# Patient Record
Sex: Male | Born: 1935
Health system: Southern US, Community
[De-identification: ages and names within clinical notes are randomized; demographics above are authoritative.]

## PROBLEM LIST (undated history)

## (undated) DIAGNOSIS — L409 Psoriasis, unspecified: Secondary | ICD-10-CM

## (undated) DIAGNOSIS — I10 Essential (primary) hypertension: Secondary | ICD-10-CM

## (undated) DIAGNOSIS — H409 Unspecified glaucoma: Secondary | ICD-10-CM

## (undated) HISTORY — PX: HYDROCELE EXCISION / REPAIR: SUR1145

## (undated) HISTORY — PX: GLAUCOMA SURGERY: SHX656

## (undated) HISTORY — PX: HERNIA REPAIR: SHX51

---

## 1997-09-24 ENCOUNTER — Other Ambulatory Visit: Admission: RE | Admit: 1997-09-24 | Discharge: 1997-09-24 | Payer: Self-pay | Admitting: Gastroenterology

## 1997-09-30 ENCOUNTER — Ambulatory Visit (HOSPITAL_COMMUNITY): Admission: RE | Admit: 1997-09-30 | Discharge: 1997-09-30 | Payer: Self-pay | Admitting: Gastroenterology

## 2000-01-04 ENCOUNTER — Encounter: Payer: Self-pay | Admitting: Neurological Surgery

## 2000-01-05 ENCOUNTER — Encounter: Payer: Self-pay | Admitting: Neurological Surgery

## 2000-01-05 ENCOUNTER — Inpatient Hospital Stay (HOSPITAL_COMMUNITY): Admission: RE | Admit: 2000-01-05 | Discharge: 2000-01-06 | Payer: Self-pay | Admitting: Neurological Surgery

## 2000-07-18 ENCOUNTER — Ambulatory Visit (HOSPITAL_COMMUNITY): Admission: RE | Admit: 2000-07-18 | Discharge: 2000-07-18 | Payer: Self-pay | Admitting: Dermatology

## 2000-07-18 ENCOUNTER — Encounter: Payer: Self-pay | Admitting: Dermatology

## 2002-05-19 ENCOUNTER — Ambulatory Visit (HOSPITAL_COMMUNITY): Admission: RE | Admit: 2002-05-19 | Discharge: 2002-05-19 | Payer: Self-pay | Admitting: Gastroenterology

## 2011-06-01 DIAGNOSIS — K648 Other hemorrhoids: Secondary | ICD-10-CM | POA: Diagnosis not present

## 2011-06-01 DIAGNOSIS — Z86010 Personal history of colonic polyps: Secondary | ICD-10-CM | POA: Diagnosis not present

## 2011-06-01 DIAGNOSIS — Z09 Encounter for follow-up examination after completed treatment for conditions other than malignant neoplasm: Secondary | ICD-10-CM | POA: Diagnosis not present

## 2011-06-01 DIAGNOSIS — K5730 Diverticulosis of large intestine without perforation or abscess without bleeding: Secondary | ICD-10-CM | POA: Diagnosis not present

## 2011-06-01 DIAGNOSIS — D126 Benign neoplasm of colon, unspecified: Secondary | ICD-10-CM | POA: Diagnosis not present

## 2011-06-20 DIAGNOSIS — L408 Other psoriasis: Secondary | ICD-10-CM | POA: Diagnosis not present

## 2011-06-20 DIAGNOSIS — Z79899 Other long term (current) drug therapy: Secondary | ICD-10-CM | POA: Diagnosis not present

## 2011-08-01 DIAGNOSIS — K219 Gastro-esophageal reflux disease without esophagitis: Secondary | ICD-10-CM | POA: Diagnosis not present

## 2011-08-01 DIAGNOSIS — Z961 Presence of intraocular lens: Secondary | ICD-10-CM | POA: Diagnosis not present

## 2011-08-01 DIAGNOSIS — I10 Essential (primary) hypertension: Secondary | ICD-10-CM | POA: Diagnosis not present

## 2011-08-01 DIAGNOSIS — H4011X0 Primary open-angle glaucoma, stage unspecified: Secondary | ICD-10-CM | POA: Diagnosis not present

## 2011-08-01 DIAGNOSIS — N400 Enlarged prostate without lower urinary tract symptoms: Secondary | ICD-10-CM | POA: Diagnosis not present

## 2011-08-01 DIAGNOSIS — H01009 Unspecified blepharitis unspecified eye, unspecified eyelid: Secondary | ICD-10-CM | POA: Diagnosis not present

## 2011-08-01 DIAGNOSIS — E782 Mixed hyperlipidemia: Secondary | ICD-10-CM | POA: Diagnosis not present

## 2011-08-01 DIAGNOSIS — H40029 Open angle with borderline findings, high risk, unspecified eye: Secondary | ICD-10-CM | POA: Diagnosis not present

## 2011-08-01 DIAGNOSIS — L408 Other psoriasis: Secondary | ICD-10-CM | POA: Diagnosis not present

## 2011-08-01 DIAGNOSIS — R7309 Other abnormal glucose: Secondary | ICD-10-CM | POA: Diagnosis not present

## 2011-11-16 DIAGNOSIS — Z23 Encounter for immunization: Secondary | ICD-10-CM | POA: Diagnosis not present

## 2011-11-16 DIAGNOSIS — S8010XA Contusion of unspecified lower leg, initial encounter: Secondary | ICD-10-CM | POA: Diagnosis not present

## 2011-12-01 DIAGNOSIS — L408 Other psoriasis: Secondary | ICD-10-CM | POA: Diagnosis not present

## 2011-12-01 DIAGNOSIS — S8010XA Contusion of unspecified lower leg, initial encounter: Secondary | ICD-10-CM | POA: Diagnosis not present

## 2011-12-19 DIAGNOSIS — L408 Other psoriasis: Secondary | ICD-10-CM | POA: Diagnosis not present

## 2012-01-15 DIAGNOSIS — Z23 Encounter for immunization: Secondary | ICD-10-CM | POA: Diagnosis not present

## 2012-02-01 DIAGNOSIS — H40029 Open angle with borderline findings, high risk, unspecified eye: Secondary | ICD-10-CM | POA: Diagnosis not present

## 2012-02-01 DIAGNOSIS — H409 Unspecified glaucoma: Secondary | ICD-10-CM | POA: Diagnosis not present

## 2012-02-01 DIAGNOSIS — Z961 Presence of intraocular lens: Secondary | ICD-10-CM | POA: Diagnosis not present

## 2012-02-01 DIAGNOSIS — H4011X0 Primary open-angle glaucoma, stage unspecified: Secondary | ICD-10-CM | POA: Diagnosis not present

## 2012-02-06 DIAGNOSIS — L408 Other psoriasis: Secondary | ICD-10-CM | POA: Diagnosis not present

## 2012-02-06 DIAGNOSIS — N400 Enlarged prostate without lower urinary tract symptoms: Secondary | ICD-10-CM | POA: Diagnosis not present

## 2012-02-06 DIAGNOSIS — R7309 Other abnormal glucose: Secondary | ICD-10-CM | POA: Diagnosis not present

## 2012-02-06 DIAGNOSIS — E782 Mixed hyperlipidemia: Secondary | ICD-10-CM | POA: Diagnosis not present

## 2012-02-06 DIAGNOSIS — I10 Essential (primary) hypertension: Secondary | ICD-10-CM | POA: Diagnosis not present

## 2012-02-06 DIAGNOSIS — K219 Gastro-esophageal reflux disease without esophagitis: Secondary | ICD-10-CM | POA: Diagnosis not present

## 2012-06-10 DIAGNOSIS — I10 Essential (primary) hypertension: Secondary | ICD-10-CM | POA: Diagnosis not present

## 2012-06-10 DIAGNOSIS — R609 Edema, unspecified: Secondary | ICD-10-CM | POA: Diagnosis not present

## 2012-06-10 DIAGNOSIS — K219 Gastro-esophageal reflux disease without esophagitis: Secondary | ICD-10-CM | POA: Diagnosis not present

## 2012-06-18 DIAGNOSIS — L570 Actinic keratosis: Secondary | ICD-10-CM | POA: Diagnosis not present

## 2012-07-30 DIAGNOSIS — H40029 Open angle with borderline findings, high risk, unspecified eye: Secondary | ICD-10-CM | POA: Diagnosis not present

## 2012-07-30 DIAGNOSIS — H4011X0 Primary open-angle glaucoma, stage unspecified: Secondary | ICD-10-CM | POA: Diagnosis not present

## 2012-07-30 DIAGNOSIS — H26499 Other secondary cataract, unspecified eye: Secondary | ICD-10-CM | POA: Diagnosis not present

## 2012-07-30 DIAGNOSIS — H40059 Ocular hypertension, unspecified eye: Secondary | ICD-10-CM | POA: Diagnosis not present

## 2012-08-08 DIAGNOSIS — Z0000 Encounter for general adult medical examination without abnormal findings: Secondary | ICD-10-CM | POA: Diagnosis not present

## 2012-08-08 DIAGNOSIS — I10 Essential (primary) hypertension: Secondary | ICD-10-CM | POA: Diagnosis not present

## 2012-08-08 DIAGNOSIS — R7309 Other abnormal glucose: Secondary | ICD-10-CM | POA: Diagnosis not present

## 2012-08-08 DIAGNOSIS — K219 Gastro-esophageal reflux disease without esophagitis: Secondary | ICD-10-CM | POA: Diagnosis not present

## 2012-08-08 DIAGNOSIS — L408 Other psoriasis: Secondary | ICD-10-CM | POA: Diagnosis not present

## 2012-08-08 DIAGNOSIS — N400 Enlarged prostate without lower urinary tract symptoms: Secondary | ICD-10-CM | POA: Diagnosis not present

## 2012-08-08 DIAGNOSIS — E782 Mixed hyperlipidemia: Secondary | ICD-10-CM | POA: Diagnosis not present

## 2012-12-05 DIAGNOSIS — Z79899 Other long term (current) drug therapy: Secondary | ICD-10-CM | POA: Diagnosis not present

## 2012-12-05 DIAGNOSIS — L408 Other psoriasis: Secondary | ICD-10-CM | POA: Diagnosis not present

## 2012-12-17 DIAGNOSIS — L570 Actinic keratosis: Secondary | ICD-10-CM | POA: Diagnosis not present

## 2012-12-17 DIAGNOSIS — B079 Viral wart, unspecified: Secondary | ICD-10-CM | POA: Diagnosis not present

## 2012-12-17 DIAGNOSIS — L408 Other psoriasis: Secondary | ICD-10-CM | POA: Diagnosis not present

## 2012-12-19 DIAGNOSIS — J029 Acute pharyngitis, unspecified: Secondary | ICD-10-CM | POA: Diagnosis not present

## 2013-01-09 DIAGNOSIS — Z23 Encounter for immunization: Secondary | ICD-10-CM | POA: Diagnosis not present

## 2013-01-23 DIAGNOSIS — H40059 Ocular hypertension, unspecified eye: Secondary | ICD-10-CM | POA: Diagnosis not present

## 2013-01-23 DIAGNOSIS — H26499 Other secondary cataract, unspecified eye: Secondary | ICD-10-CM | POA: Diagnosis not present

## 2013-01-23 DIAGNOSIS — H4011X0 Primary open-angle glaucoma, stage unspecified: Secondary | ICD-10-CM | POA: Diagnosis not present

## 2013-01-23 DIAGNOSIS — H01029 Squamous blepharitis unspecified eye, unspecified eyelid: Secondary | ICD-10-CM | POA: Diagnosis not present

## 2013-01-23 DIAGNOSIS — Z961 Presence of intraocular lens: Secondary | ICD-10-CM | POA: Diagnosis not present

## 2013-02-13 DIAGNOSIS — E782 Mixed hyperlipidemia: Secondary | ICD-10-CM | POA: Diagnosis not present

## 2013-02-13 DIAGNOSIS — E538 Deficiency of other specified B group vitamins: Secondary | ICD-10-CM | POA: Diagnosis not present

## 2013-02-13 DIAGNOSIS — R7309 Other abnormal glucose: Secondary | ICD-10-CM | POA: Diagnosis not present

## 2013-02-13 DIAGNOSIS — R609 Edema, unspecified: Secondary | ICD-10-CM | POA: Diagnosis not present

## 2013-02-13 DIAGNOSIS — I10 Essential (primary) hypertension: Secondary | ICD-10-CM | POA: Diagnosis not present

## 2013-02-13 DIAGNOSIS — K219 Gastro-esophageal reflux disease without esophagitis: Secondary | ICD-10-CM | POA: Diagnosis not present

## 2013-02-13 DIAGNOSIS — L408 Other psoriasis: Secondary | ICD-10-CM | POA: Diagnosis not present

## 2013-06-17 DIAGNOSIS — L408 Other psoriasis: Secondary | ICD-10-CM | POA: Diagnosis not present

## 2013-07-24 DIAGNOSIS — H40059 Ocular hypertension, unspecified eye: Secondary | ICD-10-CM | POA: Diagnosis not present

## 2013-07-24 DIAGNOSIS — H26499 Other secondary cataract, unspecified eye: Secondary | ICD-10-CM | POA: Diagnosis not present

## 2013-07-24 DIAGNOSIS — Z961 Presence of intraocular lens: Secondary | ICD-10-CM | POA: Diagnosis not present

## 2013-07-24 DIAGNOSIS — H4011X0 Primary open-angle glaucoma, stage unspecified: Secondary | ICD-10-CM | POA: Diagnosis not present

## 2013-09-22 DIAGNOSIS — E782 Mixed hyperlipidemia: Secondary | ICD-10-CM | POA: Diagnosis not present

## 2013-09-22 DIAGNOSIS — Z0000 Encounter for general adult medical examination without abnormal findings: Secondary | ICD-10-CM | POA: Diagnosis not present

## 2013-09-22 DIAGNOSIS — K219 Gastro-esophageal reflux disease without esophagitis: Secondary | ICD-10-CM | POA: Diagnosis not present

## 2013-09-22 DIAGNOSIS — Z23 Encounter for immunization: Secondary | ICD-10-CM | POA: Diagnosis not present

## 2013-09-22 DIAGNOSIS — R7309 Other abnormal glucose: Secondary | ICD-10-CM | POA: Diagnosis not present

## 2013-09-22 DIAGNOSIS — E538 Deficiency of other specified B group vitamins: Secondary | ICD-10-CM | POA: Diagnosis not present

## 2013-09-22 DIAGNOSIS — F430 Acute stress reaction: Secondary | ICD-10-CM | POA: Diagnosis not present

## 2013-09-22 DIAGNOSIS — R609 Edema, unspecified: Secondary | ICD-10-CM | POA: Diagnosis not present

## 2013-09-22 DIAGNOSIS — I10 Essential (primary) hypertension: Secondary | ICD-10-CM | POA: Diagnosis not present

## 2013-11-12 DIAGNOSIS — J309 Allergic rhinitis, unspecified: Secondary | ICD-10-CM | POA: Diagnosis not present

## 2013-12-16 DIAGNOSIS — L408 Other psoriasis: Secondary | ICD-10-CM | POA: Diagnosis not present

## 2013-12-16 DIAGNOSIS — D485 Neoplasm of uncertain behavior of skin: Secondary | ICD-10-CM | POA: Diagnosis not present

## 2014-01-06 DIAGNOSIS — Z23 Encounter for immunization: Secondary | ICD-10-CM | POA: Diagnosis not present

## 2014-01-23 DIAGNOSIS — H40052 Ocular hypertension, left eye: Secondary | ICD-10-CM | POA: Diagnosis not present

## 2014-01-23 DIAGNOSIS — H4011X3 Primary open-angle glaucoma, severe stage: Secondary | ICD-10-CM | POA: Diagnosis not present

## 2014-01-23 DIAGNOSIS — H26493 Other secondary cataract, bilateral: Secondary | ICD-10-CM | POA: Diagnosis not present

## 2014-01-23 DIAGNOSIS — Z961 Presence of intraocular lens: Secondary | ICD-10-CM | POA: Diagnosis not present

## 2014-03-31 DIAGNOSIS — R946 Abnormal results of thyroid function studies: Secondary | ICD-10-CM | POA: Diagnosis not present

## 2014-03-31 DIAGNOSIS — R609 Edema, unspecified: Secondary | ICD-10-CM | POA: Diagnosis not present

## 2014-03-31 DIAGNOSIS — I10 Essential (primary) hypertension: Secondary | ICD-10-CM | POA: Diagnosis not present

## 2014-03-31 DIAGNOSIS — K219 Gastro-esophageal reflux disease without esophagitis: Secondary | ICD-10-CM | POA: Diagnosis not present

## 2014-03-31 DIAGNOSIS — L409 Psoriasis, unspecified: Secondary | ICD-10-CM | POA: Diagnosis not present

## 2014-03-31 DIAGNOSIS — E782 Mixed hyperlipidemia: Secondary | ICD-10-CM | POA: Diagnosis not present

## 2014-03-31 DIAGNOSIS — E538 Deficiency of other specified B group vitamins: Secondary | ICD-10-CM | POA: Diagnosis not present

## 2014-03-31 DIAGNOSIS — R7309 Other abnormal glucose: Secondary | ICD-10-CM | POA: Diagnosis not present

## 2014-05-15 DIAGNOSIS — E039 Hypothyroidism, unspecified: Secondary | ICD-10-CM | POA: Diagnosis not present

## 2014-06-17 DIAGNOSIS — D225 Melanocytic nevi of trunk: Secondary | ICD-10-CM | POA: Diagnosis not present

## 2014-06-17 DIAGNOSIS — D1801 Hemangioma of skin and subcutaneous tissue: Secondary | ICD-10-CM | POA: Diagnosis not present

## 2014-06-17 DIAGNOSIS — L821 Other seborrheic keratosis: Secondary | ICD-10-CM | POA: Diagnosis not present

## 2014-06-17 DIAGNOSIS — L814 Other melanin hyperpigmentation: Secondary | ICD-10-CM | POA: Diagnosis not present

## 2014-06-25 DIAGNOSIS — L408 Other psoriasis: Secondary | ICD-10-CM | POA: Diagnosis not present

## 2014-06-26 DIAGNOSIS — E039 Hypothyroidism, unspecified: Secondary | ICD-10-CM | POA: Diagnosis not present

## 2014-06-30 DIAGNOSIS — L408 Other psoriasis: Secondary | ICD-10-CM | POA: Diagnosis not present

## 2014-07-23 DIAGNOSIS — I10 Essential (primary) hypertension: Secondary | ICD-10-CM | POA: Diagnosis not present

## 2014-07-23 DIAGNOSIS — E782 Mixed hyperlipidemia: Secondary | ICD-10-CM | POA: Diagnosis not present

## 2014-07-23 DIAGNOSIS — K219 Gastro-esophageal reflux disease without esophagitis: Secondary | ICD-10-CM | POA: Diagnosis not present

## 2014-07-23 DIAGNOSIS — E538 Deficiency of other specified B group vitamins: Secondary | ICD-10-CM | POA: Diagnosis not present

## 2014-07-23 DIAGNOSIS — L409 Psoriasis, unspecified: Secondary | ICD-10-CM | POA: Diagnosis not present

## 2014-07-23 DIAGNOSIS — R7309 Other abnormal glucose: Secondary | ICD-10-CM | POA: Diagnosis not present

## 2014-07-28 DIAGNOSIS — H40052 Ocular hypertension, left eye: Secondary | ICD-10-CM | POA: Diagnosis not present

## 2014-07-28 DIAGNOSIS — H26493 Other secondary cataract, bilateral: Secondary | ICD-10-CM | POA: Diagnosis not present

## 2014-07-28 DIAGNOSIS — Z961 Presence of intraocular lens: Secondary | ICD-10-CM | POA: Diagnosis not present

## 2014-07-28 DIAGNOSIS — H4011X3 Primary open-angle glaucoma, severe stage: Secondary | ICD-10-CM | POA: Diagnosis not present

## 2014-09-22 DIAGNOSIS — L821 Other seborrheic keratosis: Secondary | ICD-10-CM | POA: Diagnosis not present

## 2014-09-22 DIAGNOSIS — L570 Actinic keratosis: Secondary | ICD-10-CM | POA: Diagnosis not present

## 2014-09-22 DIAGNOSIS — D225 Melanocytic nevi of trunk: Secondary | ICD-10-CM | POA: Diagnosis not present

## 2014-09-22 DIAGNOSIS — L400 Psoriasis vulgaris: Secondary | ICD-10-CM | POA: Diagnosis not present

## 2014-10-02 DIAGNOSIS — Z0000 Encounter for general adult medical examination without abnormal findings: Secondary | ICD-10-CM | POA: Diagnosis not present

## 2014-10-02 DIAGNOSIS — Z1389 Encounter for screening for other disorder: Secondary | ICD-10-CM | POA: Diagnosis not present

## 2014-10-02 DIAGNOSIS — R609 Edema, unspecified: Secondary | ICD-10-CM | POA: Diagnosis not present

## 2014-10-02 DIAGNOSIS — L409 Psoriasis, unspecified: Secondary | ICD-10-CM | POA: Diagnosis not present

## 2014-10-02 DIAGNOSIS — E782 Mixed hyperlipidemia: Secondary | ICD-10-CM | POA: Diagnosis not present

## 2014-10-02 DIAGNOSIS — E538 Deficiency of other specified B group vitamins: Secondary | ICD-10-CM | POA: Diagnosis not present

## 2014-10-02 DIAGNOSIS — H409 Unspecified glaucoma: Secondary | ICD-10-CM | POA: Diagnosis not present

## 2014-10-02 DIAGNOSIS — K219 Gastro-esophageal reflux disease without esophagitis: Secondary | ICD-10-CM | POA: Diagnosis not present

## 2014-10-02 DIAGNOSIS — R7309 Other abnormal glucose: Secondary | ICD-10-CM | POA: Diagnosis not present

## 2014-10-02 DIAGNOSIS — E039 Hypothyroidism, unspecified: Secondary | ICD-10-CM | POA: Diagnosis not present

## 2014-10-02 DIAGNOSIS — I10 Essential (primary) hypertension: Secondary | ICD-10-CM | POA: Diagnosis not present

## 2014-12-09 DIAGNOSIS — Z79899 Other long term (current) drug therapy: Secondary | ICD-10-CM | POA: Diagnosis not present

## 2014-12-09 DIAGNOSIS — M25571 Pain in right ankle and joints of right foot: Secondary | ICD-10-CM | POA: Diagnosis not present

## 2014-12-09 DIAGNOSIS — M2011 Hallux valgus (acquired), right foot: Secondary | ICD-10-CM | POA: Diagnosis not present

## 2014-12-09 DIAGNOSIS — M79674 Pain in right toe(s): Secondary | ICD-10-CM | POA: Diagnosis not present

## 2014-12-09 DIAGNOSIS — M10079 Idiopathic gout, unspecified ankle and foot: Secondary | ICD-10-CM | POA: Diagnosis not present

## 2014-12-09 DIAGNOSIS — M10071 Idiopathic gout, right ankle and foot: Secondary | ICD-10-CM | POA: Diagnosis not present

## 2014-12-18 DIAGNOSIS — M79674 Pain in right toe(s): Secondary | ICD-10-CM | POA: Diagnosis not present

## 2015-01-08 DIAGNOSIS — B351 Tinea unguium: Secondary | ICD-10-CM | POA: Diagnosis not present

## 2015-01-08 DIAGNOSIS — M79609 Pain in unspecified limb: Secondary | ICD-10-CM | POA: Diagnosis not present

## 2015-01-08 DIAGNOSIS — M10079 Idiopathic gout, unspecified ankle and foot: Secondary | ICD-10-CM | POA: Diagnosis not present

## 2015-01-22 DIAGNOSIS — Z23 Encounter for immunization: Secondary | ICD-10-CM | POA: Diagnosis not present

## 2015-01-28 DIAGNOSIS — Z961 Presence of intraocular lens: Secondary | ICD-10-CM | POA: Diagnosis not present

## 2015-01-28 DIAGNOSIS — H40052 Ocular hypertension, left eye: Secondary | ICD-10-CM | POA: Diagnosis not present

## 2015-01-28 DIAGNOSIS — H40012 Open angle with borderline findings, low risk, left eye: Secondary | ICD-10-CM | POA: Diagnosis not present

## 2015-01-28 DIAGNOSIS — H26493 Other secondary cataract, bilateral: Secondary | ICD-10-CM | POA: Diagnosis not present

## 2015-01-28 DIAGNOSIS — H401113 Primary open-angle glaucoma, right eye, severe stage: Secondary | ICD-10-CM | POA: Diagnosis not present

## 2015-01-28 DIAGNOSIS — H10413 Chronic giant papillary conjunctivitis, bilateral: Secondary | ICD-10-CM | POA: Diagnosis not present

## 2015-02-05 DIAGNOSIS — B351 Tinea unguium: Secondary | ICD-10-CM | POA: Diagnosis not present

## 2015-02-05 DIAGNOSIS — M79674 Pain in right toe(s): Secondary | ICD-10-CM | POA: Diagnosis not present

## 2015-03-08 DIAGNOSIS — M79674 Pain in right toe(s): Secondary | ICD-10-CM | POA: Diagnosis not present

## 2015-03-11 DIAGNOSIS — H401113 Primary open-angle glaucoma, right eye, severe stage: Secondary | ICD-10-CM | POA: Diagnosis not present

## 2015-03-11 DIAGNOSIS — H40012 Open angle with borderline findings, low risk, left eye: Secondary | ICD-10-CM | POA: Diagnosis not present

## 2015-03-23 DIAGNOSIS — L308 Other specified dermatitis: Secondary | ICD-10-CM | POA: Diagnosis not present

## 2015-03-23 DIAGNOSIS — L400 Psoriasis vulgaris: Secondary | ICD-10-CM | POA: Diagnosis not present

## 2015-03-24 DIAGNOSIS — L409 Psoriasis, unspecified: Secondary | ICD-10-CM | POA: Diagnosis not present

## 2015-03-24 DIAGNOSIS — Z79899 Other long term (current) drug therapy: Secondary | ICD-10-CM | POA: Diagnosis not present

## 2015-03-29 DIAGNOSIS — R7309 Other abnormal glucose: Secondary | ICD-10-CM | POA: Diagnosis not present

## 2015-03-29 DIAGNOSIS — M109 Gout, unspecified: Secondary | ICD-10-CM | POA: Diagnosis not present

## 2015-03-29 DIAGNOSIS — E538 Deficiency of other specified B group vitamins: Secondary | ICD-10-CM | POA: Diagnosis not present

## 2015-03-29 DIAGNOSIS — R609 Edema, unspecified: Secondary | ICD-10-CM | POA: Diagnosis not present

## 2015-03-29 DIAGNOSIS — K219 Gastro-esophageal reflux disease without esophagitis: Secondary | ICD-10-CM | POA: Diagnosis not present

## 2015-03-29 DIAGNOSIS — E039 Hypothyroidism, unspecified: Secondary | ICD-10-CM | POA: Diagnosis not present

## 2015-03-29 DIAGNOSIS — I10 Essential (primary) hypertension: Secondary | ICD-10-CM | POA: Diagnosis not present

## 2015-03-29 DIAGNOSIS — E782 Mixed hyperlipidemia: Secondary | ICD-10-CM | POA: Diagnosis not present

## 2015-03-29 DIAGNOSIS — L409 Psoriasis, unspecified: Secondary | ICD-10-CM | POA: Diagnosis not present

## 2015-04-07 DIAGNOSIS — M25571 Pain in right ankle and joints of right foot: Secondary | ICD-10-CM | POA: Diagnosis not present

## 2015-04-07 DIAGNOSIS — M79674 Pain in right toe(s): Secondary | ICD-10-CM | POA: Diagnosis not present

## 2015-04-07 DIAGNOSIS — M79675 Pain in left toe(s): Secondary | ICD-10-CM | POA: Diagnosis not present

## 2015-05-05 DIAGNOSIS — M79671 Pain in right foot: Secondary | ICD-10-CM | POA: Diagnosis not present

## 2015-05-05 DIAGNOSIS — B351 Tinea unguium: Secondary | ICD-10-CM | POA: Diagnosis not present

## 2015-06-02 DIAGNOSIS — M79674 Pain in right toe(s): Secondary | ICD-10-CM | POA: Diagnosis not present

## 2015-06-02 DIAGNOSIS — M79675 Pain in left toe(s): Secondary | ICD-10-CM | POA: Diagnosis not present

## 2015-06-02 DIAGNOSIS — B351 Tinea unguium: Secondary | ICD-10-CM | POA: Diagnosis not present

## 2015-07-01 DIAGNOSIS — B351 Tinea unguium: Secondary | ICD-10-CM | POA: Diagnosis not present

## 2015-07-01 DIAGNOSIS — M79674 Pain in right toe(s): Secondary | ICD-10-CM | POA: Diagnosis not present

## 2015-07-01 DIAGNOSIS — M79675 Pain in left toe(s): Secondary | ICD-10-CM | POA: Diagnosis not present

## 2015-09-10 DIAGNOSIS — Z961 Presence of intraocular lens: Secondary | ICD-10-CM | POA: Diagnosis not present

## 2015-09-10 DIAGNOSIS — H401121 Primary open-angle glaucoma, left eye, mild stage: Secondary | ICD-10-CM | POA: Diagnosis not present

## 2015-09-10 DIAGNOSIS — H10413 Chronic giant papillary conjunctivitis, bilateral: Secondary | ICD-10-CM | POA: Diagnosis not present

## 2015-09-10 DIAGNOSIS — H401113 Primary open-angle glaucoma, right eye, severe stage: Secondary | ICD-10-CM | POA: Diagnosis not present

## 2015-09-10 DIAGNOSIS — H26493 Other secondary cataract, bilateral: Secondary | ICD-10-CM | POA: Diagnosis not present

## 2015-09-21 DIAGNOSIS — L400 Psoriasis vulgaris: Secondary | ICD-10-CM | POA: Diagnosis not present

## 2015-09-30 DIAGNOSIS — M10071 Idiopathic gout, right ankle and foot: Secondary | ICD-10-CM | POA: Diagnosis not present

## 2015-09-30 DIAGNOSIS — M79675 Pain in left toe(s): Secondary | ICD-10-CM | POA: Diagnosis not present

## 2015-09-30 DIAGNOSIS — M79674 Pain in right toe(s): Secondary | ICD-10-CM | POA: Diagnosis not present

## 2015-09-30 DIAGNOSIS — M10072 Idiopathic gout, left ankle and foot: Secondary | ICD-10-CM | POA: Diagnosis not present

## 2015-10-05 DIAGNOSIS — L409 Psoriasis, unspecified: Secondary | ICD-10-CM | POA: Diagnosis not present

## 2015-10-05 DIAGNOSIS — E782 Mixed hyperlipidemia: Secondary | ICD-10-CM | POA: Diagnosis not present

## 2015-10-05 DIAGNOSIS — K219 Gastro-esophageal reflux disease without esophagitis: Secondary | ICD-10-CM | POA: Diagnosis not present

## 2015-10-05 DIAGNOSIS — R7309 Other abnormal glucose: Secondary | ICD-10-CM | POA: Diagnosis not present

## 2015-10-05 DIAGNOSIS — R609 Edema, unspecified: Secondary | ICD-10-CM | POA: Diagnosis not present

## 2015-10-05 DIAGNOSIS — I10 Essential (primary) hypertension: Secondary | ICD-10-CM | POA: Diagnosis not present

## 2015-10-05 DIAGNOSIS — E039 Hypothyroidism, unspecified: Secondary | ICD-10-CM | POA: Diagnosis not present

## 2015-10-05 DIAGNOSIS — M109 Gout, unspecified: Secondary | ICD-10-CM | POA: Diagnosis not present

## 2015-10-05 DIAGNOSIS — E538 Deficiency of other specified B group vitamins: Secondary | ICD-10-CM | POA: Diagnosis not present

## 2015-10-05 DIAGNOSIS — Z1211 Encounter for screening for malignant neoplasm of colon: Secondary | ICD-10-CM | POA: Diagnosis not present

## 2015-10-05 DIAGNOSIS — Z1389 Encounter for screening for other disorder: Secondary | ICD-10-CM | POA: Diagnosis not present

## 2015-10-05 DIAGNOSIS — Z0000 Encounter for general adult medical examination without abnormal findings: Secondary | ICD-10-CM | POA: Diagnosis not present

## 2015-10-05 DIAGNOSIS — R7303 Prediabetes: Secondary | ICD-10-CM | POA: Diagnosis not present

## 2016-02-23 DIAGNOSIS — Z23 Encounter for immunization: Secondary | ICD-10-CM | POA: Diagnosis not present

## 2016-03-17 DIAGNOSIS — H401113 Primary open-angle glaucoma, right eye, severe stage: Secondary | ICD-10-CM | POA: Diagnosis not present

## 2016-03-17 DIAGNOSIS — H401121 Primary open-angle glaucoma, left eye, mild stage: Secondary | ICD-10-CM | POA: Diagnosis not present

## 2016-03-21 DIAGNOSIS — L400 Psoriasis vulgaris: Secondary | ICD-10-CM | POA: Diagnosis not present

## 2016-03-21 DIAGNOSIS — L409 Psoriasis, unspecified: Secondary | ICD-10-CM | POA: Diagnosis not present

## 2016-04-06 DIAGNOSIS — M25572 Pain in left ankle and joints of left foot: Secondary | ICD-10-CM | POA: Diagnosis not present

## 2016-04-06 DIAGNOSIS — M76822 Posterior tibial tendinitis, left leg: Secondary | ICD-10-CM | POA: Diagnosis not present

## 2016-04-20 DIAGNOSIS — M109 Gout, unspecified: Secondary | ICD-10-CM | POA: Diagnosis not present

## 2016-04-20 DIAGNOSIS — D696 Thrombocytopenia, unspecified: Secondary | ICD-10-CM | POA: Diagnosis not present

## 2016-04-20 DIAGNOSIS — E782 Mixed hyperlipidemia: Secondary | ICD-10-CM | POA: Diagnosis not present

## 2016-04-20 DIAGNOSIS — E538 Deficiency of other specified B group vitamins: Secondary | ICD-10-CM | POA: Diagnosis not present

## 2016-04-20 DIAGNOSIS — L409 Psoriasis, unspecified: Secondary | ICD-10-CM | POA: Diagnosis not present

## 2016-04-20 DIAGNOSIS — I10 Essential (primary) hypertension: Secondary | ICD-10-CM | POA: Diagnosis not present

## 2016-04-20 DIAGNOSIS — K219 Gastro-esophageal reflux disease without esophagitis: Secondary | ICD-10-CM | POA: Diagnosis not present

## 2016-04-20 DIAGNOSIS — R609 Edema, unspecified: Secondary | ICD-10-CM | POA: Diagnosis not present

## 2016-04-20 DIAGNOSIS — E039 Hypothyroidism, unspecified: Secondary | ICD-10-CM | POA: Diagnosis not present

## 2016-09-14 DIAGNOSIS — H401113 Primary open-angle glaucoma, right eye, severe stage: Secondary | ICD-10-CM | POA: Diagnosis not present

## 2016-09-14 DIAGNOSIS — H401122 Primary open-angle glaucoma, left eye, moderate stage: Secondary | ICD-10-CM | POA: Diagnosis not present

## 2016-09-14 DIAGNOSIS — Z961 Presence of intraocular lens: Secondary | ICD-10-CM | POA: Diagnosis not present

## 2016-09-19 DIAGNOSIS — D225 Melanocytic nevi of trunk: Secondary | ICD-10-CM | POA: Diagnosis not present

## 2016-09-19 DIAGNOSIS — L821 Other seborrheic keratosis: Secondary | ICD-10-CM | POA: Diagnosis not present

## 2016-09-19 DIAGNOSIS — L400 Psoriasis vulgaris: Secondary | ICD-10-CM | POA: Diagnosis not present

## 2016-09-19 DIAGNOSIS — L281 Prurigo nodularis: Secondary | ICD-10-CM | POA: Diagnosis not present

## 2016-09-21 DIAGNOSIS — M25572 Pain in left ankle and joints of left foot: Secondary | ICD-10-CM | POA: Diagnosis not present

## 2016-09-21 DIAGNOSIS — M10079 Idiopathic gout, unspecified ankle and foot: Secondary | ICD-10-CM | POA: Diagnosis not present

## 2016-09-21 DIAGNOSIS — M10072 Idiopathic gout, left ankle and foot: Secondary | ICD-10-CM | POA: Diagnosis not present

## 2016-09-21 DIAGNOSIS — M10071 Idiopathic gout, right ankle and foot: Secondary | ICD-10-CM | POA: Diagnosis not present

## 2016-10-18 DIAGNOSIS — H401113 Primary open-angle glaucoma, right eye, severe stage: Secondary | ICD-10-CM | POA: Diagnosis not present

## 2016-10-18 DIAGNOSIS — H401122 Primary open-angle glaucoma, left eye, moderate stage: Secondary | ICD-10-CM | POA: Diagnosis not present

## 2016-11-06 DIAGNOSIS — I10 Essential (primary) hypertension: Secondary | ICD-10-CM | POA: Diagnosis not present

## 2016-11-06 DIAGNOSIS — M109 Gout, unspecified: Secondary | ICD-10-CM | POA: Diagnosis not present

## 2016-11-06 DIAGNOSIS — E782 Mixed hyperlipidemia: Secondary | ICD-10-CM | POA: Diagnosis not present

## 2016-11-06 DIAGNOSIS — E039 Hypothyroidism, unspecified: Secondary | ICD-10-CM | POA: Diagnosis not present

## 2016-11-06 DIAGNOSIS — E538 Deficiency of other specified B group vitamins: Secondary | ICD-10-CM | POA: Diagnosis not present

## 2016-11-06 DIAGNOSIS — R609 Edema, unspecified: Secondary | ICD-10-CM | POA: Diagnosis not present

## 2016-11-06 DIAGNOSIS — K219 Gastro-esophageal reflux disease without esophagitis: Secondary | ICD-10-CM | POA: Diagnosis not present

## 2016-11-06 DIAGNOSIS — Z1389 Encounter for screening for other disorder: Secondary | ICD-10-CM | POA: Diagnosis not present

## 2016-11-06 DIAGNOSIS — L409 Psoriasis, unspecified: Secondary | ICD-10-CM | POA: Diagnosis not present

## 2016-11-17 DIAGNOSIS — H401122 Primary open-angle glaucoma, left eye, moderate stage: Secondary | ICD-10-CM | POA: Diagnosis not present

## 2016-11-17 DIAGNOSIS — H401113 Primary open-angle glaucoma, right eye, severe stage: Secondary | ICD-10-CM | POA: Diagnosis not present

## 2016-12-19 DIAGNOSIS — H401122 Primary open-angle glaucoma, left eye, moderate stage: Secondary | ICD-10-CM | POA: Diagnosis not present

## 2016-12-19 DIAGNOSIS — H401113 Primary open-angle glaucoma, right eye, severe stage: Secondary | ICD-10-CM | POA: Diagnosis not present

## 2017-01-18 DIAGNOSIS — E039 Hypothyroidism, unspecified: Secondary | ICD-10-CM | POA: Diagnosis not present

## 2017-01-18 DIAGNOSIS — E782 Mixed hyperlipidemia: Secondary | ICD-10-CM | POA: Diagnosis not present

## 2017-01-18 DIAGNOSIS — Z23 Encounter for immunization: Secondary | ICD-10-CM | POA: Diagnosis not present

## 2017-01-18 DIAGNOSIS — M109 Gout, unspecified: Secondary | ICD-10-CM | POA: Diagnosis not present

## 2017-01-18 DIAGNOSIS — E538 Deficiency of other specified B group vitamins: Secondary | ICD-10-CM | POA: Diagnosis not present

## 2017-01-18 DIAGNOSIS — Z1211 Encounter for screening for malignant neoplasm of colon: Secondary | ICD-10-CM | POA: Diagnosis not present

## 2017-01-18 DIAGNOSIS — R7303 Prediabetes: Secondary | ICD-10-CM | POA: Diagnosis not present

## 2017-01-18 DIAGNOSIS — R609 Edema, unspecified: Secondary | ICD-10-CM | POA: Diagnosis not present

## 2017-01-18 DIAGNOSIS — Z0000 Encounter for general adult medical examination without abnormal findings: Secondary | ICD-10-CM | POA: Diagnosis not present

## 2017-01-18 DIAGNOSIS — L409 Psoriasis, unspecified: Secondary | ICD-10-CM | POA: Diagnosis not present

## 2017-01-18 DIAGNOSIS — I10 Essential (primary) hypertension: Secondary | ICD-10-CM | POA: Diagnosis not present

## 2017-01-18 DIAGNOSIS — K219 Gastro-esophageal reflux disease without esophagitis: Secondary | ICD-10-CM | POA: Diagnosis not present

## 2017-01-31 DIAGNOSIS — H401113 Primary open-angle glaucoma, right eye, severe stage: Secondary | ICD-10-CM | POA: Diagnosis not present

## 2017-01-31 DIAGNOSIS — Z961 Presence of intraocular lens: Secondary | ICD-10-CM | POA: Diagnosis not present

## 2017-01-31 DIAGNOSIS — H401122 Primary open-angle glaucoma, left eye, moderate stage: Secondary | ICD-10-CM | POA: Diagnosis not present

## 2017-03-15 DIAGNOSIS — M10079 Idiopathic gout, unspecified ankle and foot: Secondary | ICD-10-CM | POA: Diagnosis not present

## 2017-03-15 DIAGNOSIS — M10072 Idiopathic gout, left ankle and foot: Secondary | ICD-10-CM | POA: Diagnosis not present

## 2017-03-15 DIAGNOSIS — M10071 Idiopathic gout, right ankle and foot: Secondary | ICD-10-CM | POA: Diagnosis not present

## 2017-03-28 DIAGNOSIS — L409 Psoriasis, unspecified: Secondary | ICD-10-CM | POA: Diagnosis not present

## 2017-03-28 DIAGNOSIS — Z79899 Other long term (current) drug therapy: Secondary | ICD-10-CM | POA: Diagnosis not present

## 2017-03-28 DIAGNOSIS — L400 Psoriasis vulgaris: Secondary | ICD-10-CM | POA: Diagnosis not present

## 2017-06-06 DIAGNOSIS — H401113 Primary open-angle glaucoma, right eye, severe stage: Secondary | ICD-10-CM | POA: Diagnosis not present

## 2017-06-06 DIAGNOSIS — H401122 Primary open-angle glaucoma, left eye, moderate stage: Secondary | ICD-10-CM | POA: Diagnosis not present

## 2017-07-24 DIAGNOSIS — L409 Psoriasis, unspecified: Secondary | ICD-10-CM | POA: Diagnosis not present

## 2017-07-24 DIAGNOSIS — I10 Essential (primary) hypertension: Secondary | ICD-10-CM | POA: Diagnosis not present

## 2017-07-24 DIAGNOSIS — M109 Gout, unspecified: Secondary | ICD-10-CM | POA: Diagnosis not present

## 2017-07-24 DIAGNOSIS — R609 Edema, unspecified: Secondary | ICD-10-CM | POA: Diagnosis not present

## 2017-07-24 DIAGNOSIS — E782 Mixed hyperlipidemia: Secondary | ICD-10-CM | POA: Diagnosis not present

## 2017-07-24 DIAGNOSIS — K219 Gastro-esophageal reflux disease without esophagitis: Secondary | ICD-10-CM | POA: Diagnosis not present

## 2017-07-24 DIAGNOSIS — E538 Deficiency of other specified B group vitamins: Secondary | ICD-10-CM | POA: Diagnosis not present

## 2017-07-24 DIAGNOSIS — E039 Hypothyroidism, unspecified: Secondary | ICD-10-CM | POA: Diagnosis not present

## 2017-09-26 DIAGNOSIS — L400 Psoriasis vulgaris: Secondary | ICD-10-CM | POA: Diagnosis not present

## 2017-10-09 DIAGNOSIS — H401113 Primary open-angle glaucoma, right eye, severe stage: Secondary | ICD-10-CM | POA: Diagnosis not present

## 2017-10-09 DIAGNOSIS — H26493 Other secondary cataract, bilateral: Secondary | ICD-10-CM | POA: Diagnosis not present

## 2017-10-09 DIAGNOSIS — H401122 Primary open-angle glaucoma, left eye, moderate stage: Secondary | ICD-10-CM | POA: Diagnosis not present

## 2017-10-09 DIAGNOSIS — Z961 Presence of intraocular lens: Secondary | ICD-10-CM | POA: Diagnosis not present

## 2018-02-11 DIAGNOSIS — Z1389 Encounter for screening for other disorder: Secondary | ICD-10-CM | POA: Diagnosis not present

## 2018-02-11 DIAGNOSIS — R269 Unspecified abnormalities of gait and mobility: Secondary | ICD-10-CM | POA: Diagnosis not present

## 2018-02-11 DIAGNOSIS — E538 Deficiency of other specified B group vitamins: Secondary | ICD-10-CM | POA: Diagnosis not present

## 2018-02-11 DIAGNOSIS — H6123 Impacted cerumen, bilateral: Secondary | ICD-10-CM | POA: Diagnosis not present

## 2018-02-11 DIAGNOSIS — M109 Gout, unspecified: Secondary | ICD-10-CM | POA: Diagnosis not present

## 2018-02-11 DIAGNOSIS — R7309 Other abnormal glucose: Secondary | ICD-10-CM | POA: Diagnosis not present

## 2018-02-11 DIAGNOSIS — E039 Hypothyroidism, unspecified: Secondary | ICD-10-CM | POA: Diagnosis not present

## 2018-02-11 DIAGNOSIS — Z0000 Encounter for general adult medical examination without abnormal findings: Secondary | ICD-10-CM | POA: Diagnosis not present

## 2018-02-11 DIAGNOSIS — I10 Essential (primary) hypertension: Secondary | ICD-10-CM | POA: Diagnosis not present

## 2018-02-11 DIAGNOSIS — E782 Mixed hyperlipidemia: Secondary | ICD-10-CM | POA: Diagnosis not present

## 2018-02-11 DIAGNOSIS — R5383 Other fatigue: Secondary | ICD-10-CM | POA: Diagnosis not present

## 2018-02-11 DIAGNOSIS — Z13220 Encounter for screening for lipoid disorders: Secondary | ICD-10-CM | POA: Diagnosis not present

## 2018-02-13 DIAGNOSIS — H401113 Primary open-angle glaucoma, right eye, severe stage: Secondary | ICD-10-CM | POA: Diagnosis not present

## 2018-02-13 DIAGNOSIS — H26493 Other secondary cataract, bilateral: Secondary | ICD-10-CM | POA: Diagnosis not present

## 2018-02-13 DIAGNOSIS — H401122 Primary open-angle glaucoma, left eye, moderate stage: Secondary | ICD-10-CM | POA: Diagnosis not present

## 2018-02-13 DIAGNOSIS — Z961 Presence of intraocular lens: Secondary | ICD-10-CM | POA: Diagnosis not present

## 2018-03-28 DIAGNOSIS — L409 Psoriasis, unspecified: Secondary | ICD-10-CM | POA: Diagnosis not present

## 2018-03-28 DIAGNOSIS — L400 Psoriasis vulgaris: Secondary | ICD-10-CM | POA: Diagnosis not present

## 2018-03-28 DIAGNOSIS — Z79899 Other long term (current) drug therapy: Secondary | ICD-10-CM | POA: Diagnosis not present

## 2018-03-28 DIAGNOSIS — L821 Other seborrheic keratosis: Secondary | ICD-10-CM | POA: Diagnosis not present

## 2018-05-21 DIAGNOSIS — Z961 Presence of intraocular lens: Secondary | ICD-10-CM | POA: Diagnosis not present

## 2018-05-21 DIAGNOSIS — H26493 Other secondary cataract, bilateral: Secondary | ICD-10-CM | POA: Diagnosis not present

## 2018-05-21 DIAGNOSIS — H401113 Primary open-angle glaucoma, right eye, severe stage: Secondary | ICD-10-CM | POA: Diagnosis not present

## 2018-05-21 DIAGNOSIS — H401122 Primary open-angle glaucoma, left eye, moderate stage: Secondary | ICD-10-CM | POA: Diagnosis not present

## 2018-06-13 DIAGNOSIS — H401122 Primary open-angle glaucoma, left eye, moderate stage: Secondary | ICD-10-CM | POA: Diagnosis not present

## 2018-06-13 DIAGNOSIS — H401123 Primary open-angle glaucoma, left eye, severe stage: Secondary | ICD-10-CM | POA: Diagnosis not present

## 2018-09-25 DIAGNOSIS — L821 Other seborrheic keratosis: Secondary | ICD-10-CM | POA: Diagnosis not present

## 2018-09-25 DIAGNOSIS — D1801 Hemangioma of skin and subcutaneous tissue: Secondary | ICD-10-CM | POA: Diagnosis not present

## 2018-09-25 DIAGNOSIS — D229 Melanocytic nevi, unspecified: Secondary | ICD-10-CM | POA: Diagnosis not present

## 2018-09-25 DIAGNOSIS — L400 Psoriasis vulgaris: Secondary | ICD-10-CM | POA: Diagnosis not present

## 2018-09-25 DIAGNOSIS — L570 Actinic keratosis: Secondary | ICD-10-CM | POA: Diagnosis not present

## 2018-09-25 DIAGNOSIS — L819 Disorder of pigmentation, unspecified: Secondary | ICD-10-CM | POA: Diagnosis not present

## 2018-10-04 DIAGNOSIS — R609 Edema, unspecified: Secondary | ICD-10-CM | POA: Diagnosis not present

## 2018-10-04 DIAGNOSIS — R7303 Prediabetes: Secondary | ICD-10-CM | POA: Diagnosis not present

## 2018-10-04 DIAGNOSIS — E782 Mixed hyperlipidemia: Secondary | ICD-10-CM | POA: Diagnosis not present

## 2018-10-04 DIAGNOSIS — I10 Essential (primary) hypertension: Secondary | ICD-10-CM | POA: Diagnosis not present

## 2018-10-04 DIAGNOSIS — E039 Hypothyroidism, unspecified: Secondary | ICD-10-CM | POA: Diagnosis not present

## 2018-10-04 DIAGNOSIS — E538 Deficiency of other specified B group vitamins: Secondary | ICD-10-CM | POA: Diagnosis not present

## 2018-10-04 DIAGNOSIS — R269 Unspecified abnormalities of gait and mobility: Secondary | ICD-10-CM | POA: Diagnosis not present

## 2018-10-04 DIAGNOSIS — K219 Gastro-esophageal reflux disease without esophagitis: Secondary | ICD-10-CM | POA: Diagnosis not present

## 2018-10-04 DIAGNOSIS — H409 Unspecified glaucoma: Secondary | ICD-10-CM | POA: Diagnosis not present

## 2018-10-04 DIAGNOSIS — L409 Psoriasis, unspecified: Secondary | ICD-10-CM | POA: Diagnosis not present

## 2018-10-04 DIAGNOSIS — M109 Gout, unspecified: Secondary | ICD-10-CM | POA: Diagnosis not present

## 2018-10-09 ENCOUNTER — Other Ambulatory Visit: Payer: Self-pay

## 2018-10-09 ENCOUNTER — Encounter (HOSPITAL_COMMUNITY): Payer: Self-pay | Admitting: *Deleted

## 2018-10-09 ENCOUNTER — Emergency Department (HOSPITAL_COMMUNITY): Payer: Medicare Other

## 2018-10-09 ENCOUNTER — Inpatient Hospital Stay (HOSPITAL_COMMUNITY)
Admission: EM | Admit: 2018-10-09 | Discharge: 2018-10-11 | DRG: 309 | Disposition: A | Discharge status: Home / Self Care | Payer: Medicare Other | Attending: Cardiology | Admitting: Cardiology

## 2018-10-09 DIAGNOSIS — I959 Hypotension, unspecified: Secondary | ICD-10-CM | POA: Diagnosis not present

## 2018-10-09 DIAGNOSIS — I4892 Unspecified atrial flutter: Secondary | ICD-10-CM | POA: Diagnosis present

## 2018-10-09 DIAGNOSIS — M7989 Other specified soft tissue disorders: Secondary | ICD-10-CM | POA: Diagnosis not present

## 2018-10-09 DIAGNOSIS — R079 Chest pain, unspecified: Secondary | ICD-10-CM | POA: Diagnosis not present

## 2018-10-09 DIAGNOSIS — L409 Psoriasis, unspecified: Secondary | ICD-10-CM | POA: Diagnosis present

## 2018-10-09 DIAGNOSIS — H409 Unspecified glaucoma: Secondary | ICD-10-CM | POA: Diagnosis present

## 2018-10-09 DIAGNOSIS — I248 Other forms of acute ischemic heart disease: Secondary | ICD-10-CM | POA: Diagnosis present

## 2018-10-09 DIAGNOSIS — Z79899 Other long term (current) drug therapy: Secondary | ICD-10-CM

## 2018-10-09 DIAGNOSIS — R7989 Other specified abnormal findings of blood chemistry: Secondary | ICD-10-CM

## 2018-10-09 DIAGNOSIS — I441 Atrioventricular block, second degree: Secondary | ICD-10-CM | POA: Diagnosis not present

## 2018-10-09 DIAGNOSIS — I483 Typical atrial flutter: Principal | ICD-10-CM | POA: Diagnosis present

## 2018-10-09 DIAGNOSIS — E039 Hypothyroidism, unspecified: Secondary | ICD-10-CM | POA: Diagnosis present

## 2018-10-09 DIAGNOSIS — R000 Tachycardia, unspecified: Secondary | ICD-10-CM

## 2018-10-09 DIAGNOSIS — Z20828 Contact with and (suspected) exposure to other viral communicable diseases: Secondary | ICD-10-CM | POA: Diagnosis not present

## 2018-10-09 DIAGNOSIS — I10 Essential (primary) hypertension: Secondary | ICD-10-CM | POA: Diagnosis present

## 2018-10-09 DIAGNOSIS — Z8249 Family history of ischemic heart disease and other diseases of the circulatory system: Secondary | ICD-10-CM

## 2018-10-09 DIAGNOSIS — R778 Other specified abnormalities of plasma proteins: Secondary | ICD-10-CM

## 2018-10-09 DIAGNOSIS — Z79890 Hormone replacement therapy (postmenopausal): Secondary | ICD-10-CM

## 2018-10-09 HISTORY — DX: Essential (primary) hypertension: I10

## 2018-10-09 HISTORY — DX: Psoriasis, unspecified: L40.9

## 2018-10-09 HISTORY — DX: Unspecified glaucoma: H40.9

## 2018-10-09 LAB — CBC
HCT: 43.9 % (ref 39.0–52.0)
Hemoglobin: 14.4 g/dL (ref 13.0–17.0)
MCH: 31.2 pg (ref 26.0–34.0)
MCHC: 32.8 g/dL (ref 30.0–36.0)
MCV: 95.2 fL (ref 80.0–100.0)
Platelets: 181 10*3/uL (ref 150–400)
RBC: 4.61 MIL/uL (ref 4.22–5.81)
RDW: 13.3 % (ref 11.5–15.5)
WBC: 8.7 10*3/uL (ref 4.0–10.5)
nRBC: 0 % (ref 0.0–0.2)

## 2018-10-09 LAB — BASIC METABOLIC PANEL
Anion gap: 9 (ref 5–15)
BUN: 21 mg/dL (ref 8–23)
CO2: 21 mmol/L — ABNORMAL LOW (ref 22–32)
Calcium: 9.1 mg/dL (ref 8.9–10.3)
Chloride: 105 mmol/L (ref 98–111)
Creatinine, Ser: 1.22 mg/dL (ref 0.61–1.24)
GFR calc Af Amer: >60 mL/min (ref >60)
GFR calc non Af Amer: 53 mL/min — ABNORMAL LOW (ref >60)
Glucose, Bld: 112 mg/dL — ABNORMAL HIGH (ref 70–99)
Potassium: 3.4 mmol/L — ABNORMAL LOW (ref 3.5–5.1)
Sodium: 135 mmol/L (ref 135–145)

## 2018-10-09 LAB — TSH: TSH: 1.236 u[IU]/mL (ref 0.350–4.500)

## 2018-10-09 LAB — TROPONIN I (HIGH SENSITIVITY)
Troponin I (High Sensitivity): 272 ng/L (ref <18)
Troponin I (High Sensitivity): 413 ng/L (ref <18)

## 2018-10-09 LAB — PROTIME-INR
INR: 1.1 (ref 0.8–1.2)
Prothrombin Time: 14.5 seconds (ref 11.4–15.2)

## 2018-10-09 LAB — SARS CORONAVIRUS 2 BY RT PCR (HOSPITAL ORDER, PERFORMED IN ~~LOC~~ HOSPITAL LAB): SARS Coronavirus 2: NEGATIVE

## 2018-10-09 MED ORDER — DILTIAZEM HCL-DEXTROSE 100-5 MG/100ML-% IV SOLN (PREMIX)
5.0000 mg/h | INTRAVENOUS | Status: DC
  Administered 2018-10-09 (×2): 5 mg/h via INTRAVENOUS
  Filled 2018-10-09 (×2): qty 100

## 2018-10-09 MED ORDER — ACETAMINOPHEN 325 MG PO TABS: 650.0000 mg | ORAL_TABLET | ORAL | Status: DC | PRN

## 2018-10-09 MED ORDER — HEPARIN BOLUS VIA INFUSION
4500.0000 [IU] | Freq: Once | INTRAVENOUS | Status: AC
  Administered 2018-10-09: 4500 [IU] via INTRAVENOUS
  Filled 2018-10-09: qty 4500

## 2018-10-09 MED ORDER — ACETAZOLAMIDE 250 MG PO TABS
250.0000 mg | ORAL_TABLET | Freq: Two times a day (BID) | ORAL | Status: DC
  Administered 2018-10-09 – 2018-10-11 (×4): 250 mg via ORAL
  Filled 2018-10-09 (×5): qty 1

## 2018-10-09 MED ORDER — DILTIAZEM LOAD VIA INFUSION
15.0000 mg | Freq: Once | INTRAVENOUS | Status: AC
  Administered 2018-10-09: 15 mg via INTRAVENOUS
  Filled 2018-10-09: qty 15

## 2018-10-09 MED ORDER — LATANOPROST 0.005 % OP SOLN
1.0000 [drp] | Freq: Every day | OPHTHALMIC | Status: DC
  Administered 2018-10-09 – 2018-10-10 (×2): 1 [drp] via OPHTHALMIC
  Filled 2018-10-09: qty 2.5

## 2018-10-09 MED ORDER — PANTOPRAZOLE SODIUM 40 MG PO TBEC
40.0000 mg | DELAYED_RELEASE_TABLET | Freq: Every day | ORAL | Status: DC
  Administered 2018-10-10 – 2018-10-11 (×2): 40 mg via ORAL
  Filled 2018-10-09 (×2): qty 1

## 2018-10-09 MED ORDER — BRIMONIDINE TARTRATE 0.2 % OP SOLN
1.0000 [drp] | Freq: Two times a day (BID) | OPHTHALMIC | Status: DC
  Administered 2018-10-09 – 2018-10-11 (×4): 1 [drp] via OPHTHALMIC
  Filled 2018-10-09: qty 5

## 2018-10-09 MED ORDER — HEPARIN (PORCINE) 25000 UT/250ML-% IV SOLN
1100.0000 [IU]/h | INTRAVENOUS | Status: DC
  Administered 2018-10-09 – 2018-10-10 (×2): 1100 [IU]/h via INTRAVENOUS
  Filled 2018-10-09 (×2): qty 250

## 2018-10-09 MED ORDER — NETARSUDIL DIMESYLATE 0.02 % OP SOLN: 1.0000 [drp] | Freq: Every day | OPHTHALMIC | Status: DC

## 2018-10-09 MED ORDER — PILOCARPINE HCL 2 % OP SOLN
1.0000 [drp] | Freq: Four times a day (QID) | OPHTHALMIC | Status: DC
  Administered 2018-10-09 – 2018-10-11 (×6): 1 [drp] via OPHTHALMIC
  Filled 2018-10-09: qty 15

## 2018-10-09 MED ORDER — AMIODARONE LOAD VIA INFUSION
150.0000 mg | Freq: Once | INTRAVENOUS | Status: AC
  Administered 2018-10-09: 22:00:00 150 mg via INTRAVENOUS
  Filled 2018-10-09: qty 83.34

## 2018-10-09 MED ORDER — AMIODARONE HCL IN DEXTROSE 360-4.14 MG/200ML-% IV SOLN
60.0000 mg/h | INTRAVENOUS | Status: AC
  Administered 2018-10-09: 60 mg/h via INTRAVENOUS
  Filled 2018-10-09: qty 200

## 2018-10-09 MED ORDER — ADULT MULTIVITAMIN W/MINERALS CH
1.0000 | ORAL_TABLET | Freq: Every day | ORAL | Status: DC
  Administered 2018-10-10 – 2018-10-11 (×2): 1 via ORAL
  Filled 2018-10-09 (×2): qty 1

## 2018-10-09 MED ORDER — POTASSIUM CHLORIDE CRYS ER 20 MEQ PO TBCR
60.0000 meq | EXTENDED_RELEASE_TABLET | Freq: Once | ORAL | Status: AC
  Administered 2018-10-09: 60 meq via ORAL
  Filled 2018-10-09: qty 3

## 2018-10-09 MED ORDER — ONDANSETRON HCL 4 MG/2ML IJ SOLN: 4.0000 mg | Freq: Four times a day (QID) | INTRAMUSCULAR | Status: DC | PRN

## 2018-10-09 MED ORDER — DILTIAZEM HCL 25 MG/5ML IV SOLN
10.0000 mg | Freq: Once | INTRAVENOUS | Status: AC
  Administered 2018-10-09: 5 mg via INTRAVENOUS
  Filled 2018-10-09: qty 5

## 2018-10-09 MED ORDER — VITAMIN B-12 1000 MCG PO TABS
1000.0000 ug | ORAL_TABLET | Freq: Every day | ORAL | Status: DC
  Administered 2018-10-10 – 2018-10-11 (×2): 1000 ug via ORAL
  Filled 2018-10-09 (×2): qty 1

## 2018-10-09 MED ORDER — AMIODARONE HCL IN DEXTROSE 360-4.14 MG/200ML-% IV SOLN
30.0000 mg/h | INTRAVENOUS | Status: DC
  Administered 2018-10-10: 30 mg/h via INTRAVENOUS
  Filled 2018-10-09 (×2): qty 200

## 2018-10-09 MED ORDER — LEVOTHYROXINE SODIUM 75 MCG PO TABS
75.0000 ug | ORAL_TABLET | Freq: Every day | ORAL | Status: DC
  Administered 2018-10-10 – 2018-10-11 (×2): 75 ug via ORAL
  Filled 2018-10-09 (×2): qty 1

## 2018-10-09 MED ORDER — BRIMONIDINE TARTRATE-TIMOLOL 0.2-0.5 % OP SOLN
1.0000 [drp] | Freq: Two times a day (BID) | OPHTHALMIC | Status: DC
  Filled 2018-10-09: qty 5

## 2018-10-09 MED ORDER — SODIUM CHLORIDE 0.9% FLUSH
3.0000 mL | Freq: Once | INTRAVENOUS | Status: AC
  Administered 2018-10-09: 3 mL via INTRAVENOUS

## 2018-10-09 MED ORDER — TIMOLOL MALEATE 0.5 % OP SOLN
1.0000 [drp] | Freq: Two times a day (BID) | OPHTHALMIC | Status: DC
  Administered 2018-10-09 – 2018-10-11 (×4): 1 [drp] via OPHTHALMIC
  Filled 2018-10-09: qty 5

## 2018-10-09 NOTE — ED Notes (Addendum)
ED TO INPATIENT HANDOFF REPORT  ED Nurse Name and Phone #: * S Name/Age/Gender Neil [name] 85 y.o. male Room/Bed: 043C/043C  Code Status   Code Status: Not on file  Home/SNF/Other Home Patient oriented to: self, place, time and situation Is this baseline? Yes   Triage Complete: Triage complete  Chief Complaint Palpitations  Triage Note Pt arrived by [facility] from home. Reports having chest pains since last night that increased when lying down. Was checking bp throughout the night and reports HR was elevated. 130s-140s. Chest pain subsided after taking asa and still has palpitations. HR 140 at triage.   Allergies No Known Allergies  Level of Care/Admitting Diagnosis ED Disposition    ED Disposition Condition Comment   Admit  Hospital Area: [facility] [[id number]]  Level of Care: Progressive [102]  Covid Evaluation: Confirmed COVID Negative  Diagnosis: Atrial flutter with rapid ventricular response [facility]) [[id number]]  Admitting Physician: Dorothy Spark [[id number]]  Attending Physician: Dorothy Spark [[id number]]  PT Class (Do Not Modify): Observation [104]  PT Acc Code (Do Not Modify): Observation [10022]       B Medical/Surgery History Past Medical History:  Diagnosis Date  . Glaucoma   . Hypertension   . Psoriasis    Past Surgical History:  Procedure Laterality Date  . GLAUCOMA SURGERY Left   . HERNIA REPAIR    . HYDROCELE EXCISION / REPAIR       A IV Location/Drains/Wounds Patient Lines/Drains/Airways Status   Active Line/Drains/Airways    Name:   Placement date:   Placement time:   Site:   Days:   Peripheral IV 10/09/18 Left Antecubital   10/09/18    1221    Antecubital   less than 1   Peripheral IV 10/09/18 Left Hand   10/09/18    1528    Hand   less than 1          Intake/Output Last 24 hours No intake or output data in the 24 hours ending 10/09/18 1846  Labs/Imaging Results for orders placed or performed during the  hospital encounter of 10/09/18 (from the past 48 hour(s))  Basic metabolic panel     Status: Abnormal   Collection Time: 10/09/18 12:26 PM  Result Value Ref Range   Sodium 135 135 - 145 mmol/L   Potassium 3.4 (L) 3.5 - 5.1 mmol/L   Chloride 105 98 - 111 mmol/L   CO2 21 (L) 22 - 32 mmol/L   Glucose, Bld 112 (H) 70 - 99 mg/dL   BUN 21 8 - 23 mg/dL   Creatinine, Ser 1.22 0.61 - 1.24 mg/dL   Calcium 9.1 8.9 - 10.3 mg/dL   GFR calc non Af Amer 53 (L) >60 mL/min   GFR calc Af Amer >60 >60 mL/min   Anion gap 9 5 - 15    Comment: Performed at [facility], [address], [place], [place] [address]  CBC     Status: None   Collection Time: 10/09/18 12:26 PM  Result Value Ref Range   WBC 8.7 4.0 - 10.5 K/uL   RBC 4.61 4.22 - 5.81 MIL/uL   Hemoglobin 14.4 13.0 - 17.0 g/dL   HCT 43.9 39.0 - 52.0 %   MCV 95.2 80.0 - 100.0 fL   MCH 31.2 26.0 - 34.0 pg   MCHC 32.8 30.0 - 36.0 g/dL   RDW 13.3 11.5 - 15.5 %   Platelets 181 150 - 400 K/uL   nRBC 0.0  0.0 - 0.2 %    Comment: Performed at [facility], [place] [address] [place], [place] [address]  Troponin I (High Sensitivity)     Status: Abnormal   Collection Time: 10/09/18 12:26 PM  Result Value Ref Range   Troponin I (High Sensitivity) 272 (HH) <18 ng/L    Comment: CRITICAL RESULT CALLED TO, READ BACK BY AND VERIFIED WITH: C Sadiya Durand RN [id number] [phone] BY A BENNETT (NOTE) Elevated high sensitivity troponin I (hsTnI) values and significant  changes across serial measurements may suggest ACS but many other  chronic and acute conditions are known to elevate hsTnI results.  Refer to the Links section for chest pain algorithms and additional  guidance. Performed at [facility], [place] [address] [place], [place] [address]   Protime-INR     Status: None   Collection Time: 10/09/18  2:03 PM  Result Value Ref Range   Prothrombin Time 14.5 11.4 - 15.2 seconds   INR 1.1 0.8 - 1.2    Comment: (NOTE) INR goal varies based on device  and disease states. Performed at [facility], [place] [address] [place], [place] [address]   TSH     Status: None   Collection Time: 10/09/18  2:04 PM  Result Value Ref Range   TSH 1.236 0.350 - 4.500 uIU/mL    Comment: Performed by a 3rd Generation assay with a functional sensitivity of <=0.01 uIU/mL. Performed at [facility], [place] [address], [place], [place] [address]   SARS Coronavirus 2 (CEPHEID - Performed in [facility]), Hosp Order     Status: None   Collection Time: 10/09/18  2:13 PM   Specimen: Nasopharyngeal Swab  Result Value Ref Range   SARS Coronavirus 2 NEGATIVE NEGATIVE    Comment: (NOTE) If result is NEGATIVE SARS-CoV-2 target nucleic acids are NOT DETECTED. The SARS-CoV-2 RNA is generally detectable in upper and lower  respiratory specimens during the acute phase of infection. The lowest  concentration of SARS-CoV-2 viral copies this assay can detect is 250  copies / mL. A negative result does not preclude SARS-CoV-2 infection  and should not be used as the sole basis for treatment or other  patient management decisions.  A negative result may occur with  improper specimen collection / handling, submission of specimen other  than nasopharyngeal swab, presence of viral mutation(s) within the  areas targeted by this assay, and inadequate number of viral copies  (<250 copies / mL). A negative result must be combined with clinical  observations, patient history, and epidemiological information. If result is POSITIVE SARS-CoV-2 target nucleic acids are DETECTED. The SARS-CoV-2 RNA is generally detectable in upper and lower  respiratory specimens dur ing the acute phase of infection.  Positive  results are indicative of active infection with SARS-CoV-2.  Clinical  correlation with patient history and other diagnostic information is  necessary to determine patient infection status.  Positive results do  not rule out bacterial infection or  co-infection with other viruses. If result is PRESUMPTIVE POSTIVE SARS-CoV-2 nucleic acids MAY BE PRESENT.   A presumptive positive result was obtained on the submitted specimen  and confirmed on repeat testing.  While 2019 novel coronavirus  (SARS-CoV-2) nucleic acids may be present in the submitted sample  additional confirmatory testing may be necessary for epidemiological  and / or clinical management purposes  to differentiate between  SARS-CoV-2 and other Sarbecovirus currently known to infect humans.  If clinically indicated  additional testing with an alternate test  methodology [phone]) is advised. The SARS-CoV-2 RNA is generally  detectable in upper and lower respiratory sp ecimens during the acute  phase of infection. The expected result is Negative. Fact Sheet for Patients:  StrictlyIdeas.no Fact Sheet for Healthcare Providers: BankingDealers.co.za This test is not yet approved or cleared by the [place] FDA and has been authorized for detection and/or diagnosis of SARS-CoV-2 by FDA under an Emergency Use Authorization (EUA).  This EUA will remain in effect (meaning this test can be used) for the duration of the COVID-19 declaration under Section 564(b)(1) of the Act, 21 U.S.C. section 360bbb-3(b)(1), unless the authorization is terminated or revoked sooner. Performed at [facility], [place] [address], [place], [place] [address]   Troponin I (High Sensitivity)     Status: Abnormal   Collection Time: 10/09/18  2:26 PM  Result Value Ref Range   Troponin I (High Sensitivity) 413 (HH) <18 ng/L    Comment: CRITICAL VALUE NOTED.  VALUE IS CONSISTENT WITH PREVIOUSLY REPORTED AND CALLED VALUE. (NOTE) Elevated high sensitivity troponin I (hsTnI) values and significant  changes across serial measurements may suggest ACS but many other  chronic and acute conditions are known to elevate hsTnI results.  Refer to the Links  section for chest pain algorithms and additional  guidance. Performed at [facility], [place] [address], [place], [place] [address]    Dg Chest 2 View  Result Date: 10/09/2018 CLINICAL DATA:  Chest pain beginning last night.  Tachycardia. EXAM: CHEST - 2 VIEW COMPARISON:  None. FINDINGS: Heart size is normal. Mediastinal shadows are normal. The lungs are clear except for mild atelectasis or scar in the right middle lobe or lingula. Pulmonary vascularity is normal. No effusions. No acute bone finding. IMPRESSION: Probably no active disease. Minimal linear scarring or atelectasis in the right middle lobe or lingula. Electronically Signed   By: Nelson Chimes M.D.   On: 10/09/2018 13:06    Pending Labs Unresulted Labs (From admission, onward)    Start     Ordered   10/11/18 0500  Heparin level (unfractionated)  Daily,   R     10/09/18 1518   10/10/18 0500  CBC  Daily,   R     10/09/18 1518   10/10/18 0000  Heparin level (unfractionated)  Once-Timed,   STAT     10/09/18 1518   10/09/18 1441  Protime-INR (if pt is taking coumadin)  ONCE - STAT,   STAT     10/09/18 1441   10/09/18 1405  T3, free  ONCE - STAT,   STAT     10/09/18 1404   Signed and Held  Basic metabolic panel  Tomorrow morning,   R     Signed and Held   Signed and Held  Lipid panel  Tomorrow morning,   R     Signed and Held   Signed and Held  Hemoglobin A1c  Tomorrow morning,   R     Signed and Held          Vitals/Pain Today's Vitals   10/09/18 1800 10/09/18 1815 10/09/18 1830 10/09/18 1845  BP: 109/81 120/80 99/74 107/79  Pulse: (!) 141 (!) 140 (!) 139 (!) 140  Resp: (!) 26 15 (!) 26 (!) 25  Temp:      TempSrc:      SpO2: 100% 99% 99% 99%  Weight:      PainSc:  Isolation Precautions No active isolations  Medications Medications  diltiazem (CARDIZEM) 1 mg/mL load via infusion 15 mg (15 mg Intravenous Bolus from Bag 10/09/18 1541)    And  diltiazem (CARDIZEM) 100 mg in dextrose 5% 166mL (1 mg/mL)  infusion (15 mg/hr Intravenous Rate/Dose Change 10/09/18 1828)  heparin ADULT infusion 100 units/mL (25000 units/252mL sodium chloride 0.45%) (1,100 Units/hr Intravenous New Bag/Given 10/09/18 1728)  potassium chloride SA (K-DUR) CR tablet 60 mEq (has no administration in time range)  amiodarone (NEXTERONE) 1.8 mg/mL load via infusion 150 mg (has no administration in time range)    Followed by  amiodarone (NEXTERONE PREMIX) 360-4.14 MG/200ML-% (1.8 mg/mL) IV infusion (has no administration in time range)    Followed by  amiodarone (NEXTERONE PREMIX) 360-4.14 MG/200ML-% (1.8 mg/mL) IV infusion (has no administration in time range)  sodium chloride flush (NS) 0.9 % injection 3 mL (3 mLs Intravenous Given 10/09/18 1321)  diltiazem (CARDIZEM) injection 10 mg (5 mg Intravenous Given 10/09/18 1313)  heparin bolus via infusion 4,500 Units (4,500 Units Intravenous Bolus from Bag 10/09/18 1728)    Mobility walks Low fall risk   Focused Assessments Cardiac Assessment Handoff:  Cardiac Rhythm: Supraventricular tachycardia No results found for: CKTOTAL, CKMB, CKMBINDEX, TROPONINI No results found for: DDIMER Does the Patient currently have chest pain? No      R Recommendations: See Admitting Provider Note  Report given to:   Additional Notes:   1. New onset atrial flutter: patient presented with palpitations and chest tightness. EKG revealed atrial flutter with RVR with 2:1 AV block. No prior heart disease history. TSH wnl. No recent infections. No history of CHF, DM, or stroke. He was started on a heparin gtt for stroke ppx and a diltiazem gtt for rate control.  Continue heparin gtt for stroke ppx - anticipate transition to eliquis 5mg  BID prior to discharge Continue diltiazem gtt for rate control Suspect he will need TEE/DCCV in an attempt to restore sinus rhythm - timing TBD Will check an echocardiogram   2. Elevated troponin: Trop 272>413. Low flat trend. Suspect demand ischemia in the  setting of Aflutter RVR. No prior ischemic evaluation in the past. No prior heart disease.  Will check an echocardiogram to assess LV function and wall motion Will check FLP and HgbA1C for risk stratification  3. HTN: BP stable. Currently on a diltiazem gtt for management of #1.   Hold home amlodipine while on diltiazem gtt  Will home hold lisinopril-HCTZ to allow BP room for management of #1  4. Glaucoma: s/p surgery on L eye.  Continue acetazolamide and eye drops  5. Hypothyroidism: TSH wnl. Continue levothyroxine

## 2018-10-09 NOTE — H&P (Addendum)
Cardiology Admission History and Physical:   Patient ID: Neil [name] MRN: [id number]; DOB: 1929/08/20   Admission date: 10/09/2018  Primary Care Provider: Antony Contras, MD Primary Cardiologist: New to [place] Primary Electrophysiologist:  None   Chief Complaint:  palpitations  Patient Profile:   Neil [name] is a 85 y.o. male with PMH of HTN, psoriasis on Enbrel, hypothyroidsim, and glaucoma, who presented with complaints of palpitations.   History of Present Illness:   Neil [name] was in his usual state of health until last evening when he noticed palpitations and some chest tightness. He reported laying down to sleep and felt his heart racing. He took his vitals and BP was stable but he reports his HR was in the 130s. He had difficulty sleeping due to anxiety of elevated HR's and some SOB when laying down. He reported taking a baby aspirin last night with some improvement in chest tightness. Given persistently elevated HR's he contacted the RN at the assisted living facility he resides at, who then activated EMS given tachycardia.   He has no prior heart disease history. No prior ischemic work up or echocardiograms in the past. He reports family history of HTN in mother and sister but no other significant heart history. He was estranged from his father and does not know his history. He denies tobacco use or recreational drug use. He drinks one alcoholic beverage per day.   At the time of this evaluation he does not feel his heart racing. He reports being tired after not sleeping all night or most of the day. He denies history of thyroid problems (though is on levothyroxine), CAD, CHF, Stroke/TIA/TE, or DM. He reports chronic ankle edema which is unchanged in recent months. He reports slowing down with activity over the past several years but no complaints of exertional chest pain or SOB. He reports 1 mechanical fall a year ago. No complaints of melena, hematochezia, or hematuria.  He reports HR is typically in the 50s-60s.   ED course: tachycardic to the 130s-140s, intermittently tachypneic, otherwise VSS. Labs notable for K 3.4, Cr 1.22, CBC wnl, coags wnl, TSH wnl, Trop 272>413. EKG with atrial flutter with RVR with 2:1 AV block and diffuse ST-T wave abnormalities (no comparison). COVID-19 negative. CXR without acute findings. He was started on a heparin gtt for stroke ppx and a diltiazem gtt for rate control. Cardiology asked to evaluate for new onset atrial flutter.   Heart Pathway Score:     Past Medical History:  Diagnosis Date  . Glaucoma   . Hypertension   . Psoriasis     Past Surgical History:  Procedure Laterality Date  . GLAUCOMA SURGERY Left   . HERNIA REPAIR    . HYDROCELE EXCISION / REPAIR       Medications Prior to Admission: Prior to Admission medications   Medication Sig Start Date End Date Taking? Authorizing Provider  acetaZOLAMIDE (DIAMOX) 250 MG tablet Take 250 mg by mouth 2 (two) times daily.   Yes [provider]  amLODipine (NORVASC) 2.5 MG tablet Take 2.5 mg by mouth daily.   Yes [provider]  bimatoprost (LUMIGAN) 0.01 % SOLN Place 1 drop into both eyes at bedtime.   Yes [provider]  brimonidine-timolol (COMBIGAN) 0.2-0.5 % ophthalmic solution Place 1 drop into both eyes 2 (two) times a day.   Yes [provider]  ENBREL 50 MG/ML injection Inject 1 mL into the skin once a week. 10/04/18  Yes [provider]  levothyroxine (SYNTHROID) 75 MCG tablet Take 75 mcg by mouth daily before breakfast.   Yes [provider]  lisinopril-hydrochlorothiazide (ZESTORETIC) 20-25 MG tablet Take 1 tablet by mouth daily.   Yes [provider]  Multiple Vitamin (MULTIVITAMIN WITH MINERALS) TABS tablet Take 1 tablet by mouth daily.   Yes [provider]  Netarsudil Dimesylate (RHOPRESSA) 0.02 % SOLN Place 1 drop into both eyes at bedtime.   Yes [provider]  omeprazole  (PRILOSEC) 40 MG capsule Take 40 mg by mouth daily.   Yes [provider]  pilocarpine (PILOCAR) 2 % ophthalmic solution Place 1 drop into the left eye 4 (four) times daily.   Yes [provider]  vitamin B-12 (CYANOCOBALAMIN) 1000 MCG tablet Take 1,000 mcg by mouth daily.   Yes [provider]     Allergies:   No Known Allergies  Social History:   Social History   Socioeconomic History  . Marital status: Married    Spouse name: Not on file  . Number of children: Not on file  . Years of education: Not on file  . Highest education level: Not on file  Occupational History  . Not on file  Social Needs  . Financial resource strain: Not on file  . Food insecurity    Worry: Not on file    Inability: Not on file  . Transportation needs    Medical: Not on file    Non-medical: Not on file  Tobacco Use  . Smoking status: Former Smoker    Types: Pipe  . Smokeless tobacco: Never Used  . Tobacco comment: quit >40 years ago  Substance and Sexual Activity  . Alcohol use: Yes    Alcohol/week: 7.0 standard drinks    Types: 7 Standard drinks or equivalent per week    Comment: 1 drink daily  . Drug use: Never  . Sexual activity: Not on file  Lifestyle  . Physical activity    Days per week: Not on file    Minutes per session: Not on file  . Stress: Not on file  Relationships  . Social Herbalist on phone: Not on file    Gets together: Not on file    Attends religious service: Not on file    Active member of club or organization: Not on file    Attends meetings of clubs or organizations: Not on file    Relationship status: Not on file  . Intimate partner violence    Fear of current or ex partner: Not on file    Emotionally abused: Not on file    Physically abused: Not on file    Forced sexual activity: Not on file  Other Topics Concern  . Not on file  Social History Narrative   Wife has alzheimer's    Family History:   The patient's family  history includes High blood pressure in his mother and sister; Stroke in his mother.    ROS:  Please see the history of present illness.  All other ROS reviewed and negative.     Physical Exam/Data:   Vitals:   10/09/18 1615 10/09/18 1630 10/09/18 1645 10/09/18 1715  BP: 129/85 (!) 122/92 (!) 119/97 109/85  Pulse: (!) 135 (!) 136 (!) 136 (!) 139  Resp: 19 (!) 22 18 (!) 30  Temp:      TempSrc:      SpO2: 100% 100% 100% 100%  Weight:       No  intake or output data in the 24 hours ending 10/09/18 1740 Last 3 Weights 10/09/2018  Weight (lbs) 172 lb  Weight (kg) 78.019 kg     There is no height or weight on file to calculate BMI.  General:  Well nourished, well developed, in no acute distress HEENT: sclera anicteric, L pupil constricted (s/p glaucoma surgery) Neck: no JVD Vascular: No carotid bruits; distal pulses 2+ bilaterally without bruits  Cardiac:  normal S1, S2; IRRR; no murmurs, rubs, or gallops Lungs:  clear to auscultation bilaterally, no wheezing, rhonchi or rales  Abd: soft, nontender, no hepatomegaly  Ext: trace ankle edema Musculoskeletal:  No deformities, BUE and BLE strength normal and equal Skin: warm and dry  Neuro:  CNs 2-12 intact, no focal abnormalities noted Psych:  Normal affect    EKG:  atrial flutter with RVR with 2:1 AV block and diffuse ST-T wave abnormalities (no comparison).   Relevant CV Studies: None  Laboratory Data:  High Sensitivity Troponin:   Recent Labs  Lab 10/09/18 1226 10/09/18 1426  TROPONINIHS 272* 413*      Cardiac EnzymesNo results for input(s): TROPONINI in the last 168 hours. No results for input(s): TROPIPOC in the last 168 hours.  Chemistry Recent Labs  Lab 10/09/18 1226  NA 135  K 3.4*  CL 105  CO2 21*  GLUCOSE 112*  BUN 21  CREATININE 1.22  CALCIUM 9.1  GFRNONAA 53*  GFRAA >60  ANIONGAP 9    No results for input(s): PROT, ALBUMIN, AST, ALT, ALKPHOS, BILITOT in the last 168 hours. Hematology Recent Labs   Lab 10/09/18 1226  WBC 8.7  RBC 4.61  HGB 14.4  HCT 43.9  MCV 95.2  MCH 31.2  MCHC 32.8  RDW 13.3  PLT 181   BNPNo results for input(s): BNP, PROBNP in the last 168 hours.  DDimer No results for input(s): DDIMER in the last 168 hours.   Radiology/Studies:  Dg Chest 2 View  Result Date: 10/09/2018 CLINICAL DATA:  Chest pain beginning last night.  Tachycardia. EXAM: CHEST - 2 VIEW COMPARISON:  None. FINDINGS: Heart size is normal. Mediastinal shadows are normal. The lungs are clear except for mild atelectasis or scar in the right middle lobe or lingula. Pulmonary vascularity is normal. No effusions. No acute bone finding. IMPRESSION: Probably no active disease. Minimal linear scarring or atelectasis in the right middle lobe or lingula. Electronically Signed   By: Quanta Roher Chimes M.D.   On: 10/09/2018 13:06    Assessment and Plan:   1. New onset atrial flutter: patient presented with palpitations and chest tightness. EKG revealed atrial flutter with RVR with 2:1 AV block. No prior heart disease history. TSH wnl. No recent infections. No history of CHF, DM, or stroke. He was started on a heparin gtt for stroke ppx and a diltiazem gtt for rate control.  - This patients CHA2DS2-VASc Score and unadjusted Ischemic Stroke Rate (% per year) is equal to 3.2 % stroke rate/year from a score of 3 Above score calculated as 1 point each if present [CHF, HTN, DM, Vascular=MI/PAD/Aortic Plaque, Age if 65-74, or Male] Above score calculated as 2 points each if present [Age > 75, or Stroke/TIA/TE] - Continue heparin gtt for stroke ppx - anticipate transition to eliquis 5mg  BID prior to discharge - Continue diltiazem gtt for rate control - Suspect he will need TEE/DCCV in an attempt to restore sinus rhythm - timing TBD - Will check an echocardiogram   2. Elevated troponin: Trop 272>413.  Low flat trend. Suspect demand ischemia in the setting of Aflutter RVR. No prior ischemic evaluation in the past. No  prior heart disease.  - Will check an echocardiogram to assess LV function and wall motion - Will check FLP and HgbA1C for risk stratification  3. HTN: BP stable. Currently on a diltiazem gtt for management of #1.  - Hold home amlodipine while on diltiazem gtt - Will home hold lisinopril-HCTZ to allow BP room for management of #1  4. Glaucoma: s/p surgery on L eye.  - Continue acetazolamide and eye drops  5. Hypothyroidism: TSH wnl. - Continue levothyroxine  Severity of Illness: The appropriate patient status for this patient is OBSERVATION. Observation status is judged to be reasonable and necessary in order to provide the required intensity of service to ensure the patient's safety. The patient's presenting symptoms, physical exam findings, and initial radiographic and laboratory data in the context of their medical condition is felt to place them at decreased risk for further clinical deterioration. Furthermore, it is anticipated that the patient will be medically stable for discharge from the hospital within 2 midnights of admission. The following factors support the patient status of observation.   " The patient's presenting symptoms include palpitations. " The physical exam findings include tachycardic to the 130s. " The initial radiographic and laboratory data are EKG with new onset atrial flutter..  For questions or updates, please contact [place] Please consult [email or web address] for contact info under   Signed, Abigail Butts, PA-C  10/09/2018 5:40 PM   The patient was seen, examined and discussed with Abigail Butts, PA-C  and I agree with the above.   85 y.o. male with PMH of HTN, psoriasis on Enbrel, hypothyroidsim, and glaucoma, who presented with complaints of palpitations and was found to be in atrial flutter with 2-1 block ventricular rate 150 bpm and slight hypotension. The patient states that he has been feeling palpitations and shortness of breath in the last  couple days, he is otherwise active walking no prior history of chest pain or shortness of breath.  Denies any recent fever chills, no lower extremity edema, he has experienced proximal nocturnal dyspnea and two-pillow orthopnea 3 nights ago. On physical exam he is in no acute distress, he has no JVDs, he has regular rate and rhythm to the rapid, no murmur, his lungs are clear, his extremities are warm and have good peripheral pulses.  His EKG reveals atrial flutter with 2-1 block and ventricular rate 150 bpm.  No prior EKG available for comparison.  No echocardiogram available, will order 1 once his heart rate is below 100.  TSH is normal.  Assessment and plan: New onset atrial flutter with 2-1 block Mildly elevated troponin consistent with demand ischemia in the settings of atrial flutter  We will continue Cardizem drip, heparin drip, will also add amiodarone drip. We will admit to inpatient.  Ena Dawley, MD 10/09/2018

## 2018-10-09 NOTE — ED Provider Notes (Signed)
[facility] EMERGENCY DEPARTMENT Provider Note   CSN: [id number] Arrival date & time: 10/09/18  1217     History   Chief Complaint Chief Complaint  Patient presents with  . Chest Pain  . Palpitations    HPI Neil [name] is a 85 y.o. male.     HPI  85 year old male presents today complaining of palpitations that began last night.  Patient states that he was checking his blood pressure because he felt some palpitations and noted that his heart rate was elevated in the 130s to 140s.  Nursing triage notes states chest pain, but patient denies having any pain to me.  He states that he did take aspirin at home which she does not normally do.  He denies any previous similar symptoms.  He denies pain, dyspnea, abdominal pain, nausea, vomiting, or extremity swelling.  He has not had any similar symptoms in the past.  Past Medical History:  Diagnosis Date  . Hypertension   . Hypothyroidism   . Vitamin B12 deficiency     There are no active problems to display for this patient.   History reviewed. No pertinent surgical history.      Home Medications    Prior to Admission medications   Not on File    Family History History reviewed. No pertinent family history.  Social History Social History   Tobacco Use  . Smoking status: Never Smoker  Substance Use Topics  . Alcohol use: Yes    Comment: 1 drink daily  . Drug use: Never     Allergies   Patient has no known allergies.   Review of Systems Review of Systems  All other systems reviewed and are negative.    Physical Exam Updated Vital Signs BP 112/77 (BP Location: Right Arm)   Pulse (!) 136   Temp 98.6 F (37 C) (Oral)   Resp (!) 25   SpO2 97%   Physical Exam Vitals signs and nursing note reviewed.  Constitutional:      General: He is not in acute distress.    Appearance: He is well-developed. He is not ill-appearing.  HENT:     Head: Normocephalic and atraumatic.  Eyes:   Pupils: Pupils are equal, round, and reactive to light.  Neck:     Musculoskeletal: Normal range of motion and neck supple.  Cardiovascular:     Rate and Rhythm: Tachycardia present.     Pulses:          Carotid pulses are 2+ on the right side and 2+ on the left side.      Radial pulses are 1+ on the right side and 1+ on the left side.     Heart sounds: Normal heart sounds.  Pulmonary:     Effort: Pulmonary effort is normal. No tachypnea or accessory muscle usage.     Breath sounds: Normal breath sounds.  Chest:     Chest wall: No mass or deformity.  Abdominal:     General: Bowel sounds are normal. There is no abdominal bruit.     Palpations: Abdomen is soft.  Musculoskeletal: Normal range of motion.     Right lower leg: No edema.     Left lower leg: No edema.  Skin:    General: Skin is warm and dry.     Capillary Refill: Capillary refill takes less than 2 seconds.  Neurological:     General: No focal deficit present.     Mental Status: He is alert.  Psychiatric:        Mood and Affect: Mood normal.      ED Treatments / Results  Labs (all labs ordered are listed, but only abnormal results are displayed) Labs Reviewed  BASIC METABOLIC PANEL - Abnormal; Notable for the following components:      Result Value   Potassium 3.4 (*)    CO2 21 (*)    Glucose, Bld 112 (*)    GFR calc non Af Amer 53 (*)    All other components within normal limits  TROPONIN I (HIGH SENSITIVITY) - Abnormal; Notable for the following components:   Troponin I (High Sensitivity) 272 (*)    All other components within normal limits  CBC  TROPONIN I (HIGH SENSITIVITY)    EKG None  Radiology Dg Chest 2 View  Result Date: 10/09/2018 CLINICAL DATA:  Chest pain beginning last night.  Tachycardia. EXAM: CHEST - 2 VIEW COMPARISON:  None. FINDINGS: Heart size is normal. Mediastinal shadows are normal. The lungs are clear except for mild atelectasis or scar in the right middle lobe or lingula.  Pulmonary vascularity is normal. No effusions. No acute bone finding. IMPRESSION: Probably no active disease. Minimal linear scarring or atelectasis in the right middle lobe or lingula. Electronically Signed   By: Nelson Chimes M.D.   On: 10/09/2018 13:06    Procedures .Critical Care Performed by: Pattricia Boss, MD Authorized by: Pattricia Boss, MD   Critical care provider statement:    Critical care time (minutes):  45   Critical care end time:  10/09/2018 5:09 PM   Critical care was necessary to treat or prevent imminent or life-threatening deterioration of the following conditions:  Cardiac failure   Critical care was time spent personally by me on the following activities:  Discussions with consultants, evaluation of patient's response to treatment, examination of patient, ordering and performing treatments and interventions, ordering and review of laboratory studies, ordering and review of radiographic studies, pulse oximetry, re-evaluation of patient's condition, obtaining history from patient or surrogate and review of old charts   (including critical care time)  Medications Ordered in ED Medications  sodium chloride flush (NS) 0.9 % injection 3 mL (3 mLs Intravenous Given 10/09/18 1321)  diltiazem (CARDIZEM) injection 10 mg (5 mg Intravenous Given 10/09/18 1313)     Initial Impression / Assessment and Plan / ED Course  I have reviewed the triage vital signs and the nursing notes.  Pertinent labs & imaging results that were available during my care of the patient were reviewed by me and considered in my medical decision making (see chart for details).       2:02 PM Consult Cardiology- discussed with Wannetta Sender and cardiology will consult Consult pharmacy discussed anticoagulation I will start heparin Cardizem IV given in 5 mg increments.   I was at the bedside. Repeat 10 mg bolus and drip started with no significant change in heart rate. Elevated troponin noted and reviewed Cardiology  has been at bedside and also assessed and currently plans to admit 85 year old malePreviously history of hypertension vitamin B12 deficiency, but generally healthy, presents today with tachycardia.  EKG most consistent with atrial flutter with 2-1 conduction, however unable to see flutter waves due to right and have been unable to slows down rate down to evaluate with strip.  However, patient has been otherwise very stable here in the ED.  He does have rising troponin which is elevated, this appears to be most consistent with his rate being elevated.  He is not having chest pain denies that he has had chest pain.  Consideration was given to electrical cardioversion, however due to patient's age, EKG, and no previous history, cardiology was consulted to assist with management. Final Clinical Impressions(s) / ED Diagnoses   Final diagnoses:  Atrial flutter, unspecified type ([place])  Tachycardia  Elevated troponin    ED Discharge Orders    None       Pattricia Boss, MD 10/09/18 1709

## 2018-10-09 NOTE — Progress Notes (Signed)
ANTICOAGULATION CONSULT NOTE - Initial Consult  Pharmacy Consult for heparin Indication: atrial fibrillation  No Known Allergies  Patient Measurements: Weight: 172 lb (78 kg) Heparin Dosing Weight: 78kg  Vital Signs: Temp: 98.6 F (37 C) (07/01 1233) Temp Source: Oral (07/01 1233) BP: 108/85 (07/01 1500) Pulse Rate: 138 (07/01 1500)  Labs: Recent Labs    10/09/18 1226 10/09/18 1403  HGB 14.4  --   HCT 43.9  --   PLT 181  --   LABPROT  --  14.5  INR  --  1.1  CREATININE 1.22  --   TROPONINIHS 272*  --     CrCl cannot be calculated (Unknown ideal weight.).   Medical History: Past Medical History:  Diagnosis Date  . Hypertension   . Hypothyroidism   . Vitamin B12 deficiency     Medications:  Infusions:  . diltiazem (CARDIZEM) infusion    . heparin      Assessment: 9 yom presented to the hospital with CP and palpitations. Found to be in [place]. To start IV heparin. He is not on anticoagulation PTA. Baseline CBC is WNL.   Goal of Therapy:  Heparin level 0.3-0.7 units/ml Monitor platelets by anticoagulation protocol: Yes   Plan:  Heparin bolus 4500 units IV x 1  Heparin gtt 1100 units/hr Check an 8 hr heparin level  Daily heparin level and CBC  Jeffrey Graefe, Rande Lawman 10/09/2018,3:21 PM

## 2018-10-09 NOTE — ED Notes (Signed)
Additional 5mg  of Cardizem given by Ray MD.

## 2018-10-09 NOTE — ED Notes (Signed)
Patient transported to X-ray 

## 2018-10-09 NOTE — ED Triage Notes (Signed)
Pt arrived by [facility] from home. Reports having chest pains since last night that increased when lying down. Was checking bp throughout the night and reports HR was elevated. 130s-140s. Chest pain subsided after taking asa and still has palpitations. HR 140 at triage.

## 2018-10-10 ENCOUNTER — Observation Stay (HOSPITAL_BASED_OUTPATIENT_CLINIC_OR_DEPARTMENT_OTHER): Payer: Medicare Other

## 2018-10-10 DIAGNOSIS — I4892 Unspecified atrial flutter: Secondary | ICD-10-CM | POA: Diagnosis not present

## 2018-10-10 DIAGNOSIS — L409 Psoriasis, unspecified: Secondary | ICD-10-CM | POA: Diagnosis not present

## 2018-10-10 DIAGNOSIS — I340 Nonrheumatic mitral (valve) insufficiency: Secondary | ICD-10-CM

## 2018-10-10 DIAGNOSIS — Z20828 Contact with and (suspected) exposure to other viral communicable diseases: Secondary | ICD-10-CM | POA: Diagnosis not present

## 2018-10-10 DIAGNOSIS — I959 Hypotension, unspecified: Secondary | ICD-10-CM | POA: Diagnosis not present

## 2018-10-10 DIAGNOSIS — I483 Typical atrial flutter: Secondary | ICD-10-CM | POA: Diagnosis not present

## 2018-10-10 DIAGNOSIS — Z79890 Hormone replacement therapy (postmenopausal): Secondary | ICD-10-CM | POA: Diagnosis not present

## 2018-10-10 DIAGNOSIS — I10 Essential (primary) hypertension: Secondary | ICD-10-CM | POA: Diagnosis not present

## 2018-10-10 DIAGNOSIS — H409 Unspecified glaucoma: Secondary | ICD-10-CM | POA: Diagnosis not present

## 2018-10-10 DIAGNOSIS — E039 Hypothyroidism, unspecified: Secondary | ICD-10-CM | POA: Diagnosis not present

## 2018-10-10 DIAGNOSIS — I248 Other forms of acute ischemic heart disease: Secondary | ICD-10-CM | POA: Diagnosis not present

## 2018-10-10 DIAGNOSIS — R000 Tachycardia, unspecified: Secondary | ICD-10-CM | POA: Diagnosis not present

## 2018-10-10 DIAGNOSIS — Z79899 Other long term (current) drug therapy: Secondary | ICD-10-CM | POA: Diagnosis not present

## 2018-10-10 DIAGNOSIS — Z8249 Family history of ischemic heart disease and other diseases of the circulatory system: Secondary | ICD-10-CM | POA: Diagnosis not present

## 2018-10-10 DIAGNOSIS — I441 Atrioventricular block, second degree: Secondary | ICD-10-CM | POA: Diagnosis not present

## 2018-10-10 LAB — LIPID PANEL
Cholesterol: 150 mg/dL (ref 0–200)
HDL: 36 mg/dL — ABNORMAL LOW (ref >40)
LDL Cholesterol: 101 mg/dL — ABNORMAL HIGH (ref 0–99)
Total CHOL/HDL Ratio: 4.2 RATIO
Triglycerides: 64 mg/dL (ref <150)
VLDL: 13 mg/dL (ref 0–40)

## 2018-10-10 LAB — BASIC METABOLIC PANEL
Anion gap: 7 (ref 5–15)
BUN: 16 mg/dL (ref 8–23)
CO2: 21 mmol/L — ABNORMAL LOW (ref 22–32)
Calcium: 8.5 mg/dL — ABNORMAL LOW (ref 8.9–10.3)
Chloride: 109 mmol/L (ref 98–111)
Creatinine, Ser: 1.01 mg/dL (ref 0.61–1.24)
GFR calc Af Amer: >60 mL/min (ref >60)
GFR calc non Af Amer: >60 mL/min (ref >60)
Glucose, Bld: 122 mg/dL — ABNORMAL HIGH (ref 70–99)
Potassium: 3.9 mmol/L (ref 3.5–5.1)
Sodium: 137 mmol/L (ref 135–145)

## 2018-10-10 LAB — CBC
HCT: 39 % (ref 39.0–52.0)
Hemoglobin: 13.1 g/dL (ref 13.0–17.0)
MCH: 31.3 pg (ref 26.0–34.0)
MCHC: 33.6 g/dL (ref 30.0–36.0)
MCV: 93.3 fL (ref 80.0–100.0)
Platelets: 164 10*3/uL (ref 150–400)
RBC: 4.18 MIL/uL — ABNORMAL LOW (ref 4.22–5.81)
RDW: 13.2 % (ref 11.5–15.5)
WBC: 7.7 10*3/uL (ref 4.0–10.5)
nRBC: 0 % (ref 0.0–0.2)

## 2018-10-10 LAB — T3, FREE: T3, Free: 2.1 pg/mL (ref 2.0–4.4)

## 2018-10-10 LAB — TROPONIN I (HIGH SENSITIVITY): Troponin I (High Sensitivity): 636 ng/L (ref <18)

## 2018-10-10 LAB — HEMOGLOBIN A1C
Hgb A1c MFr Bld: 5.7 % — ABNORMAL HIGH (ref 4.8–5.6)
Mean Plasma Glucose: 116.89 mg/dL

## 2018-10-10 LAB — HEPARIN LEVEL (UNFRACTIONATED)
Heparin Unfractionated: 0.32 IU/mL (ref 0.30–0.70)
Heparin Unfractionated: 0.42 IU/mL (ref 0.30–0.70)

## 2018-10-10 LAB — ECHOCARDIOGRAM COMPLETE
Height: 73 in
Weight: 2744 oz

## 2018-10-10 NOTE — Progress Notes (Signed)
ANTICOAGULATION CONSULT NOTE  Pharmacy Consult for Heparin Indication: atrial fibrillation  No Known Allergies  Patient Measurements: Height: 6\' 1"  (185.4 cm) Weight: 171 lb 8 oz (77.8 kg) IBW/kg (Calculated) : 79.9 Heparin Dosing Weight: 78kg  Vital Signs: Temp: 97.5 F (36.4 C) (07/02 0419) Temp Source: Oral (07/02 0419) BP: 102/62 (07/02 0419) Pulse Rate: 109 (07/02 0419)  Labs: Recent Labs    10/09/18 1226 10/09/18 1403 10/09/18 1426 10/09/18 2335 10/10/18 0426  HGB 14.4  --   --   --  13.1  HCT 43.9  --   --   --  39.0  PLT 181  --   --   --  164  LABPROT  --  14.5  --   --   --   INR  --  1.1  --   --   --   HEPARINUNFRC  --   --   --  0.32 0.42  CREATININE 1.22  --   --   --  1.01  TROPONINIHS 272*  --  413*  --   --     Estimated Creatinine Clearance: 56.7 mL/min (by C-G formula based on SCr of 1.01 mg/dL).   Medical History: Past Medical History:  Diagnosis Date  . Glaucoma   . Hypertension   . Psoriasis     Medications:  Infusions:  . amiodarone 30 mg/hr (10/10/18 0420)  . diltiazem (CARDIZEM) infusion Stopped (10/10/18 0018)  . heparin 1,100 Units/hr (10/10/18 0420)    Assessment: 54 yom presented to the hospital with CP and palpitations. Found to be in [place]. To start IV heparin. He is not on anticoagulation PTA. Baseline CBC is WNL.   Heparin level therapeutic x 2 CBC stable  Goal of Therapy:  Heparin level 0.3-0.7 units/ml Monitor platelets by anticoagulation protocol: Yes   Plan:  Cont Heparin gtt 1100 units/hr Confirmatory heparin level with AM labs  Planning Eliquis  Thank you Anette Guarneri, PharmD [phone]

## 2018-10-10 NOTE — Progress Notes (Addendum)
Patient noted to be in SR on telemetry.  EKG obtained.  Doreene Adas, PA notified no new orders received.

## 2018-10-10 NOTE — Progress Notes (Signed)
  Echocardiogram 2D Echocardiogram has been performed.  Bobbye Charleston 10/10/2018, 9:05 AM

## 2018-10-10 NOTE — Progress Notes (Signed)
Pt is now in afib rate 70's-80's. BP 85/61. Stopped Cardizem gtt, amio still infusing. Notified Cardiology.

## 2018-10-10 NOTE — Progress Notes (Addendum)
Progress Note  Patient Name: Neil [name] Date of Encounter: 10/10/2018  Primary Cardiologist: Ena Dawley, MD   Subjective   Amiodarone bolus and drip initiated overnight, was rate controlled in the 80s for approximately three hours, now back to 120s. Cardizem was titrated down and then off for hypotension for systolic pressure in the 20B. He remains in atrial flutter this morning, generally asymptomatic.  He is worried about his wife who has Alzheimer's and is in a facility.   Inpatient Medications    Scheduled Meds:  acetaZOLAMIDE  250 mg Oral BID   brimonidine  1 drop Both Eyes BID   And   timolol  1 drop Both Eyes BID   latanoprost  1 drop Both Eyes QHS   levothyroxine  75 mcg Oral QAC breakfast   multivitamin with minerals  1 tablet Oral Daily   Netarsudil Dimesylate  1 drop Both Eyes QHS   pantoprazole  40 mg Oral Daily   pilocarpine  1 drop Left Eye QID   vitamin B-12  1,000 mcg Oral Daily   Continuous Infusions:  amiodarone 30 mg/hr (10/10/18 0420)   diltiazem (CARDIZEM) infusion Stopped (10/10/18 0018)   heparin 1,100 Units/hr (10/10/18 0420)   PRN Meds: acetaminophen, ondansetron (ZOFRAN) IV   Vital Signs    Vitals:   10/09/18 2218 10/09/18 2303 10/10/18 0019 10/10/18 0419  BP: 105/75 100/73 (!) 85/61 102/62  Pulse: (!) 130   (!) 109  Resp:  (!) 28  (!) 28  Temp:    (!) 97.5 F (36.4 C)  TempSrc:    Oral  SpO2:    100%  Weight:    77.8 kg  Height:        Intake/Output Summary (Last 24 hours) at 10/10/2018 0748 Last data filed at 10/10/2018 0420 Gross per 24 hour  Intake 1182.28 ml  Output 250 ml  Net 932.28 ml   Last 3 Weights 10/10/2018 10/09/2018 10/09/2018  Weight (lbs) 171 lb 8 oz 171 lb 8 oz 172 lb  Weight (kg) 77.792 kg 77.792 kg 78.019 kg      Telemetry    Atrial flutter/fibrillation - Rate controlled 0012- ~0300 in the 80s with amiodarone and cardizem, now back in the 120s - Personally Reviewed  ECG   No new tracings  - Personally Reviewed  Physical Exam   GEN: No acute distress.   Neck: No JVD Cardiac: irregular rhythm, tachycardic rate Respiratory: Clear to auscultation bilaterally. GI: Soft, nontender, non-distended  MS: No edema; No deformity. Neuro:  Nonfocal  Psych: Normal affect   Labs    High Sensitivity Troponin:   Recent Labs  Lab 10/09/18 1226 10/09/18 1426  TROPONINIHS 272* 413*      Cardiac EnzymesNo results for input(s): TROPONINI in the last 168 hours. No results for input(s): TROPIPOC in the last 168 hours.   Chemistry Recent Labs  Lab 10/09/18 1226 10/10/18 0426  NA 135 137  K 3.4* 3.9  CL 105 109  CO2 21* 21*  GLUCOSE 112* 122*  BUN 21 16  CREATININE 1.22 1.01  CALCIUM 9.1 8.5*  GFRNONAA 53* >60  GFRAA >60 >60  ANIONGAP 9 7     Hematology Recent Labs  Lab 10/09/18 1226 10/10/18 0426  WBC 8.7 7.7  RBC 4.61 4.18*  HGB 14.4 13.1  HCT 43.9 39.0  MCV 95.2 93.3  MCH 31.2 31.3  MCHC 32.8 33.6  RDW 13.3 13.2  PLT 181 164    BNPNo results for input(s): BNP,  PROBNP in the last 168 hours.   DDimer No results for input(s): DDIMER in the last 168 hours.   Radiology    Dg Chest 2 View  Result Date: 10/09/2018 CLINICAL DATA:  Chest pain beginning last night.  Tachycardia. EXAM: CHEST - 2 VIEW COMPARISON:  None. FINDINGS: Heart size is normal. Mediastinal shadows are normal. The lungs are clear except for mild atelectasis or scar in the right middle lobe or lingula. Pulmonary vascularity is normal. No effusions. No acute bone finding. IMPRESSION: Probably no active disease. Minimal linear scarring or atelectasis in the right middle lobe or lingula. Electronically Signed   By: Jillianne Gamino Chimes M.D.   On: 10/09/2018 13:06    Cardiac Studies   Echo pending  Patient Profile     85 y.o. male with a PMH significant for HTN, psoriasis (on enbrel), hypothyroidism, and gluacoma who presented with rapid heart rate, palpitations, and chest tightness found to be in new  onset atrial flutter.  Assessment & Plan    1. New onset atrial flutter - RVR with 2:1 AV block on presentation by EKG - TSH normal, K  - on heparin drip - This patients CHA2DS2-VASc Score and unadjusted Ischemic Stroke Rate (% per year) is equal to 3.2 % stroke rate/year from a score of 3 (age, HTN) - echocardiogram pending - HR 120s - amiodarone gtt started last night, unfortunately cardizem was titrated from 15 mg/hr down to 5 mg/hr and then off for hypotension in the 44Y systolic - he was rate controlled in the 80s for approximately 3 hours last night - he remains asymptomatic - schedule is currently full for TEE/DCCV today - will continue attempts for rate and rhythm control, hopefully will cardiovert on amiodarone  2. Elevated troponin - hs troponin 272 --> 413 - will check a third hs T this morning to evaluate trend - suspect demand ischemia in the setting of RVR - 10/10/2018: Cholesterol 150; HDL 36; LDL Cholesterol 101; Triglycerides 64; VLDL 13 - A1c 5.7% - echo pending - no anginal symptoms  3. Hypertension - holding home norvasc for cardizem gtt for rate control - hypotensive overnight  4. Hypothyroidism - TSH WNL - continue levothyroxine  For questions or updates, please contact [place] Please consult [email or web address] for contact info under     Signed, Ledora Bottcher, PA  10/10/2018, 7:48 AM    The patient was seen, examined and discussed with Minette Brine , PA-C and I agree with the above.   The patient remains in atrial flutter with RVR, we will hold cardizem drip as he became hypotensive. Unfortunately there is no space in endoscopy for TEE/DCCV today, we will plan for Monday. We will continue amiodarone drip, heparin drip and tracking troponin trend. He is chest pain free and believed to be demand ischemia, echo is pending.  Ena Dawley, MD 10/10/2018

## 2018-10-10 NOTE — Progress Notes (Signed)
ANTICOAGULATION CONSULT NOTE  Pharmacy Consult for Heparin Indication: atrial fibrillation  No Known Allergies  Patient Measurements: Height: 6\' 1"  (185.4 cm) Weight: 171 lb 8 oz (77.8 kg) IBW/kg (Calculated) : 79.9 Heparin Dosing Weight: 78kg  Vital Signs: Temp: 98.5 F (36.9 C) (07/01 1953) Temp Source: Oral (07/01 1953) BP: 85/61 (07/02 0019) Pulse Rate: 130 (07/01 2218)  Labs: Recent Labs    10/09/18 1226 10/09/18 1403 10/09/18 1426 10/09/18 2335  HGB 14.4  --   --   --   HCT 43.9  --   --   --   PLT 181  --   --   --   LABPROT  --  14.5  --   --   INR  --  1.1  --   --   HEPARINUNFRC  --   --   --  0.32  CREATININE 1.22  --   --   --   TROPONINIHS 272*  --  413*  --     Estimated Creatinine Clearance: 46.9 mL/min (by C-G formula based on SCr of 1.22 mg/dL).   Medical History: Past Medical History:  Diagnosis Date  . Glaucoma   . Hypertension   . Psoriasis     Medications:  Infusions:  . amiodarone    . diltiazem (CARDIZEM) infusion 5 mg/hr (10/09/18 2303)  . heparin 1,100 Units/hr (10/09/18 1728)    Assessment: 74 yom presented to the hospital with CP and palpitations. Found to be in [place]. To start IV heparin. He is not on anticoagulation PTA. Baseline CBC is WNL.   7/2 AM update: initial heparin level therapeutic   Goal of Therapy:  Heparin level 0.3-0.7 units/ml Monitor platelets by anticoagulation protocol: Yes   Plan:  Cont Heparin gtt 1100 units/hr Confirmatory heparin level with AM labs  Narda Bonds, PharmD, [place] Pharmacist Phone: [phone]

## 2018-10-10 NOTE — Progress Notes (Signed)
Converted to NSR. EKG reviewed. Plan to continue amiodarone drip overnight and transition to PO in AM.  Tami Lin Duke, PA-C 10/10/2018, 7:28 PM

## 2018-10-11 ENCOUNTER — Other Ambulatory Visit: Payer: Self-pay | Admitting: Medical

## 2018-10-11 DIAGNOSIS — I483 Typical atrial flutter: Secondary | ICD-10-CM

## 2018-10-11 LAB — CBC
HCT: 41.4 % (ref 39.0–52.0)
Hemoglobin: 14 g/dL (ref 13.0–17.0)
MCH: 31.7 pg (ref 26.0–34.0)
MCHC: 33.8 g/dL (ref 30.0–36.0)
MCV: 93.7 fL (ref 80.0–100.0)
Platelets: 162 10*3/uL (ref 150–400)
RBC: 4.42 MIL/uL (ref 4.22–5.81)
RDW: 13.1 % (ref 11.5–15.5)
WBC: 6.4 10*3/uL (ref 4.0–10.5)
nRBC: 0 % (ref 0.0–0.2)

## 2018-10-11 LAB — HEPARIN LEVEL (UNFRACTIONATED): Heparin Unfractionated: 0.43 IU/mL (ref 0.30–0.70)

## 2018-10-11 MED ORDER — AMIODARONE HCL 200 MG PO TABS: 200.0000 mg | ORAL_TABLET | Freq: Two times a day (BID) | ORAL | 0 refills | Status: DC

## 2018-10-11 MED ORDER — LISINOPRIL-HYDROCHLOROTHIAZIDE 10-12.5 MG PO TABS: 1.0000 | ORAL_TABLET | Freq: Every day | ORAL | 3 refills | Status: DC

## 2018-10-11 MED ORDER — AMIODARONE HCL 200 MG PO TABS
200.0000 mg | ORAL_TABLET | Freq: Two times a day (BID) | ORAL | Status: DC
  Administered 2018-10-11: 09:00:00 200 mg via ORAL
  Filled 2018-10-11: qty 1

## 2018-10-11 MED ORDER — APIXABAN 5 MG PO TABS
5.0000 mg | ORAL_TABLET | Freq: Two times a day (BID) | ORAL | Status: DC
  Administered 2018-10-11: 5 mg via ORAL
  Filled 2018-10-11: qty 1

## 2018-10-11 MED ORDER — APIXABAN 5 MG PO TABS: 5.0000 mg | ORAL_TABLET | Freq: Two times a day (BID) | ORAL | 6 refills | Status: DC

## 2018-10-11 MED ORDER — AMIODARONE HCL 200 MG PO TABS: 200.0000 mg | ORAL_TABLET | Freq: Every day | ORAL | 3 refills | Status: DC

## 2018-10-11 NOTE — Progress Notes (Signed)
Patient will be on eliquis which is new for him, NCM contacted the pharmacy where prescription was sent and they state the eliquis cost is 23.27 and that will be what he will pay for his refills.  NCM tried to contact patient , he was in the restroom , NCM spoke with [facility] RN gave her the information about refill price and she will give patient the 30 day free coupon.

## 2018-10-11 NOTE — Discharge Summary (Signed)
Discharge Summary    Patient ID: Neil [name],  MRN: [id number], DOB/AGE: 06-25-1929 85 y.o.  Admit date: 10/09/2018 Discharge date: 10/11/2018  Primary Care Provider: Antony Contras Primary Cardiologist: Ena Dawley, MD  Discharge Diagnoses    Active Problems:   Atrial flutter with rapid ventricular response [facility])   Atrial flutter ([place])   Allergies No Known Allergies  Diagnostic Studies/Procedures    Echocardiogram 10/10/2018: 1. The left ventricle has normal systolic function, with an ejection fraction of 55-60%. The cavity size was normal. There is moderate concentric left ventricular hypertrophy. Left ventricular diastolic function could not be evaluated secondary to  atrial fibrillation. No evidence of left ventricular regional wall motion abnormalities.  2. The right ventricle has normal systolic function. The cavity was mildly enlarged. There is no increase in right ventricular wall thickness. Right ventricular systolic pressure is normal.  3. Right atrial size was moderately dilated.  4. The aortic valve is tricuspid. Mild thickening of the aortic valve. No stenosis of the aortic valve.  5. There is mild to moderate dilatation of the ascending aorta measuring 45 mm. _____________   History of Present Illness     Neil [name] was in his usual state of health until last evening when he noticed palpitations and some chest tightness. He reported laying down to sleep and felt his heart racing. He took his vitals and BP was stable but he reports his HR was in the 130s. He had difficulty sleeping due to anxiety of elevated HR's and some SOB when laying down. He reported taking a baby aspirin last night with some improvement in chest tightness. Given persistently elevated HR's he contacted the RN at the assisted living facility he resides at, who then activated EMS given tachycardia.   He has no prior heart disease history. No prior ischemic work up or echocardiograms in the  past. He reports family history of HTN in mother and sister but no other significant heart history. He was estranged from his father and does not know his history. He denies tobacco use or recreational drug use. He drinks one alcoholic beverage per day.   At the time of admission he does not feel his heart racing. He reported being tired after not sleeping all night or most of the day. He denied history of thyroid problems (though is on levothyroxine), CAD, CHF, Stroke/TIA/TE, or DM. He reports chronic ankle edema which is unchanged in recent months. He reported slowing down with activity over the past several years but no complaints of exertional chest pain or SOB. He reported 1 mechanical fall a year ago. No complaints of melena, hematochezia, or hematuria. He reports HR is typically in the 50s-60s.   ED course: tachycardic to the 130s-140s, intermittently tachypneic, otherwise VSS. Labs notable for K 3.4, Cr 1.22, CBC wnl, coags wnl, TSH wnl, Trop 272>413. EKG with atrial flutter with RVR with 2:1 AV block and diffuse ST-T wave abnormalities (no comparison). COVID-19 negative. CXR without acute findings. He was started on a heparin gtt for stroke ppx and a diltiazem gtt for rate control. Cardiology asked to evaluate for new onset atrial flutter.   Hospital Course     Consultants: None   1. New onset atrial flutter: patient presented with palpitations and chest tightness. EKG revealed atrial flutter with RVR with 2:1 AV block. No prior heart disease history. TSH wnl. No recent infections. No history of CHF, DM, or stroke. He was started on a heparin gtt for stroke  ppx given CHA2DS2-VASc Score of 3 (HTN, Age >75), and transitioned to apixaban 5mg  BID prior to discharge. Initial rate control with diltiazem gtt limited by hypotension. He was started on an amiodarone gtt and converted to NSR 10/10/2018. He was recommended to continue po amiodarone 200mg  BID x1 week, then 200mg  daily going forward.  -  Continue apixaban 5mg  BID - Continue amiodarone 200mg  BID x1 week, then 200mg  daily - Will arrange an outpatient NST to evaluate for ischemic heart disease - Can consider referral to EP for ablation at follow-up visit   2. HTN: BP soft with diltiazem gtt, improved with discontinuation. Home lisinopril-HCTZ resumed at discharge. Continued to home amlodipine held at discharge.  - Recommended ongoing home monitoring of BP  - Will arrange outpatient BMET in 1 week   3. Elevated troponin: High-sensitivity trop trend [phone]. Trend flat and more consistent with demand ischemia in the setting of atrial flutter with RVR.  - Will arrange an outpatient NST to evaluate for ischemic heart disease   ____________  Discharge Vitals Blood pressure 129/76, pulse 61, temperature 99.9 F (37.7 C), temperature source Oral, resp. rate (!) 28, height 6\' 1"  (1.854 m), weight 78.1 kg, SpO2 100 %.  Filed Weights   10/09/18 2000 10/10/18 0419 10/11/18 0512  Weight: 77.8 kg 77.8 kg 78.1 kg    Labs & Radiologic Studies    CBC Recent Labs    10/10/18 0426 10/11/18 0731  WBC 7.7 6.4  HGB 13.1 14.0  HCT 39.0 41.4  MCV 93.3 93.7  PLT 164 756   Basic Metabolic Panel Recent Labs    10/09/18 1226 10/10/18 0426  NA 135 137  K 3.4* 3.9  CL 105 109  CO2 21* 21*  GLUCOSE 112* 122*  BUN 21 16  CREATININE 1.22 1.01  CALCIUM 9.1 8.5*   Liver Function Tests No results for input(s): AST, ALT, ALKPHOS, BILITOT, PROT, ALBUMIN in the last 72 hours. No results for input(s): LIPASE, AMYLASE in the last 72 hours. Cardiac Enzymes No results for input(s): CKTOTAL, CKMB, CKMBINDEX, TROPONINI in the last 72 hours. BNP Invalid input(s): POCBNP D-Dimer No results for input(s): DDIMER in the last 72 hours. Hemoglobin A1C Recent Labs    10/10/18 0426  HGBA1C 5.7*   Fasting Lipid Panel Recent Labs    10/10/18 0426  CHOL 150  HDL 36*  LDLCALC 101*  TRIG 64  CHOLHDL 4.2   Thyroid Function  Tests Recent Labs    10/09/18 1404 10/09/18 1422  TSH 1.236  --   T3FREE  --  2.1   _____________  Dg Chest 2 View  Result Date: 10/09/2018 CLINICAL DATA:  Chest pain beginning last night.  Tachycardia. EXAM: CHEST - 2 VIEW COMPARISON:  None. FINDINGS: Heart size is normal. Mediastinal shadows are normal. The lungs are clear except for mild atelectasis or scar in the right middle lobe or lingula. Pulmonary vascularity is normal. No effusions. No acute bone finding. IMPRESSION: Probably no active disease. Minimal linear scarring or atelectasis in the right middle lobe or lingula. Electronically Signed   By: Nelson Chimes M.D.   On: 10/09/2018 13:06   Disposition   Patient was seen and examined by Dr. Stanford Breed who deemed patient as stable for discharge. Follow-up has been arranged. Discharge medications as listed below.   Follow-up Plans & Appointments    Follow-up Information    Dorothy Spark, MD Follow up.   Specialty: Cardiology Why: Our office will contact you with an appointment date/time  to follow-up outpatient. If you do not hear from our office by Wednesday, 10/16/2018, please call to follow-up.  Contact information: [place] [id number] [phone]        [place] [facility] Follow up on 10/18/2018.   Specialty: Cardiology Why: Please present to our office 10/18/2018 for blood work. You can arrive anytime between 8am and 12pm, 2pm and 4:30pm.  Contact information: [address] [place] [phone]           Discharge Medications   Allergies as of 10/11/2018   No Known Allergies     Medication List    STOP taking these medications   amLODipine 2.5 MG tablet Commonly known as: NORVASC   lisinopril-hydrochlorothiazide 20-25 MG tablet Commonly known as: ZESTORETIC Replaced by: lisinopril-hydrochlorothiazide 10-12.5 MG tablet     TAKE these medications   acetaZOLAMIDE  250 MG tablet Commonly known as: DIAMOX Take 250 mg by mouth 2 (two) times daily.   amiodarone 200 MG tablet Commonly known as: PACERONE Take 1 tablet (200 mg total) by mouth 2 (two) times daily for 6 days.   amiodarone 200 MG tablet Commonly known as: Pacerone Take 1 tablet (200 mg total) by mouth daily. Start taking on: October 18, 2018   apixaban 5 MG Tabs tablet Commonly known as: ELIQUIS Take 1 tablet (5 mg total) by mouth 2 (two) times daily.   bimatoprost 0.01 % Soln Commonly known as: LUMIGAN Place 1 drop into both eyes at bedtime.   Combigan 0.2-0.5 % ophthalmic solution Generic drug: brimonidine-timolol Place 1 drop into both eyes 2 (two) times a day.   Enbrel 50 MG/ML injection Generic drug: etanercept Inject 1 mL into the skin once a week.   levothyroxine 75 MCG tablet Commonly known as: SYNTHROID Take 75 mcg by mouth daily before breakfast.   lisinopril-hydrochlorothiazide 10-12.5 MG tablet Commonly known as: Zestoretic Take 1 tablet by mouth daily. Replaces: lisinopril-hydrochlorothiazide 20-25 MG tablet   multivitamin with minerals Tabs tablet Take 1 tablet by mouth daily.   omeprazole 40 MG capsule Commonly known as: PRILOSEC Take 40 mg by mouth daily.   pilocarpine 2 % ophthalmic solution Commonly known as: PILOCAR Place 1 drop into the left eye 4 (four) times daily.   Rhopressa 0.02 % Soln Generic drug: Netarsudil Dimesylate Place 1 drop into both eyes at bedtime.   vitamin B-12 1000 MCG tablet Commonly known as: CYANOCOBALAMIN Take 1,000 mcg by mouth daily.           Outstanding Labs/Studies   BMET in 1 week.  Duration of Discharge Encounter   Greater than 30 minutes including physician time.  Signed, Abigail Butts PA-C 10/11/2018, 9:21 AM

## 2018-10-11 NOTE — TOC Benefit Eligibility Note (Signed)
Transition of Care [facility]) Benefit Eligibility Note    Patient Details  Name: Neil [name] MRN: [id number] Date of Birth: 1929-11-09   Medication/Dose: Arne [place]  5 MG BID  Covered?: Yes  Tier: 3 Drug  Prescription Coverage Preferred Pharmacy: [facility] AND  NO MAIL ORDER  Spoke with Person/Company/Phone Number:: DELMARSHAE   @ [facility] # [phone] OPT- MEMBER  Co-Pay: $ 23.27  Prior Approval: No  Deductible: Met  Additional Notes: APIXABAN : NON-FORMULARY    Memory Argue Phone Number: 10/11/2018, 11:19 AM

## 2018-10-11 NOTE — Plan of Care (Signed)
Pt stable, discharge education complete, discharged to independent living center.

## 2018-10-11 NOTE — Discharge Instructions (Signed)
MEDICATION CHANGES: - Stop amlodipine (norvasc) - Decrease your lisinopril-HCTZ to 10-12.5mg  daily - that dose is 1/2 of your previous dose. A new prescription was sent to your pharmacy, but if you have leftover pills at home from your old prescription, you can cut them in half until you pick up your new prescription.  - You were started on amiodarone - Take 1 tablet 2 times per day 10/11/2018 to 10/17/2018, then starting 10/18/2018 you will take 1 tablet daily.  - You were started on apixaban 5mg  - Take 1 tablet 2 times per day. This medication is a blood thinner. Please notify the office if any blood in your urine or stools, or if any black stools occur. If you fall and hit your head, you should present to an emergency room to get checked out to ensure you do not have any bleeding in your brain.   FOLLOW-UP TESTING: - We would like to check your blood work in 1 week for close monitoring of your kidney function and potassium levels. Please refer to your appointments section for info on where to present - You have also been recommended for a stress test to evaluate for decreased blood flow to your heart. Our office will contact you for scheduling. Information for testing is listed below.     Atrial Flutter  Atrial flutter is a type of abnormal heart rhythm (arrhythmia). The heart has an electrical system that tells the heart how to beat. In atrial flutter, the signals move rapidly in the top chambers of the heart (the atria). This makes your heart beat very fast. Atrial flutter can come and go, or it can be permanent. If this condition is not treated it can cause serious complications, such as stroke or weakened heart muscle (cardiomyopathy). What are the causes? This condition may be caused by:  A heart condition or problem, such as: ? A heart attack. ? Heart failure. ? A heart valve problem. ? Heart surgery.  A lung problem, such as: ? A blood clot in the lungs (pulmonary embolism, or  PE). ? Chronic obstructive pulmonary disease.  Poorly controlled high blood pressure (hypertension).  Overactive thyroid (hyperthyroidism).  Caffeine.  Some decongestant cold medicines.  Low levels of minerals called electrolytes in the blood.  Cocaine. What increases the risk? You are more likely to develop this condition if:  You are an elderly adult.  You are a man.  You are obese.  You have obstructive sleep apnea.  You have a family history of atrial flutter.  You have diabetes. What are the signs or symptoms? Symptoms of this condition include:  A feeling that your heart is pounding or racing (palpitations).  Shortness of breath.  Chest pain.  Feeling light-headed.  Dizziness.  Fainting.  Low blood pressure (hypotension).  Fatigue. Sometimes there are no symptoms associated with arrhythmia. How is this diagnosed? This condition may be diagnosed based on:  An electrocardiogram (ECG). This is a test that records the electrical signals in the heart.  Ambulatory cardiac monitoring. This is a small recording device that is connected by wires to flat, sticky disks (electrodes) that are attached to your chest.  An echocardiogram. This is a test that uses sound waves to create pictures of your heart.  A transesophageal echocardiogram (TEE). In this test, a device is placed down your esophagus. This device then uses sounds waves to create even closer pictures of your heart.  Stress test. This test records your heartbeat while you exercise and checks  to see if the heart muscle is receiving adequate blood supply. How is this treated? This condition may be treated with:  Medicines to: ? Make your heart beat more slowly. ? Keep your heart in normal rhythm. ? Prevent a stroke.  Cardioversion. This uses medicines or an electrical shock to make the heart beat normally.  Ablation. This destroys the heart tissue that is causing the problem. In some cases,  your health care provider will treat other underlying conditions. Follow these instructions at home: Medicines  Take over-the-counter and prescription medicines only as told by your health care provider. ? Make sure you take your medicines exactly as told by your health care provider. ? Do not miss any doses.  Do not take any new medicines without talking to your health care provider. Lifestyle  Eat heart-healthy foods. Talk with a dietitian to make an eating plan that is right for you.  Do not use any products that contain nicotine or tobacco, such as cigarettes and e-cigarettes. If you need help quitting, ask your health care provider.  Limit alcohol intake to no more than 1 drink per day for nonpregnant women and 2 drinks per day for men. One drink equals 12 oz of beer, 5 oz of wine, or 1 oz of hard liquor.  Try to reduce any stress. Stress can make your symptoms worse.  Get screened for sleep apnea. If you have the condition, work with your health care provider to find a treatment that works for you.  Do not use drugs.  Avoid excessive caffeine. General instructions  Lose weight if your health care provider tells you to do that.  Keep all follow-up visits as told by your health care provider. This is important. Contact a health care provider if:  Your symptoms get worse.  You notice that your palpitations are increasing. Get help right away if:  You have any symptoms of a stroke. "BE FAST" is an easy way to remember the main warning signs of a stroke: ? B - Balance. Signs are dizziness, sudden trouble walking, or loss of balance. ? E - Eyes. Signs are trouble seeing or a sudden change in vision. ? F - Face. Signs are sudden weakness or numbness of the face, or the face or eyelid drooping on one side. ? A - Arms. Signs are weakness or numbness in an arm. This happens suddenly and usually on one side of the body. ? S - Speech. Signs are sudden trouble speaking, slurred  speech, or trouble understanding what people say. ? T - Time. Time to call emergency services. Write down what time symptoms started.  You have other signs of a stroke, such as: ? A sudden, severe headache with no known cause. ? Nausea or vomiting. ? Seizure.  You have additional symptoms, such as: ? Fainting. ? Shortness of breath. ? Pain or pressure in your chest. ? Suddenly feeling nauseous or suddenly vomiting. ? Increased sweating with no known cause.  These symptoms may represent a serious problem that is an emergency. Do not wait to see if the symptoms will go away. Get medical help right away. Call your local emergency services (911 in the [place] Do not drive yourself to the hospital. Summary  Atrial flutter is an abnormal heart rhythm that can give you symptoms of palpitations, shortness of breath, or fatigue.  Atrial flutter is often treated with medicines to keep your heart in a normal rhythm and to prevent a stroke.  You should seek immediate  help if you cannot catch your breath, have chest pain or pressure, or have weakness, especially on one side of your body. This information is not intended to replace advice given to you by your health care provider. Make sure you discuss any questions you have with your health care provider. Document Released: 08/13/2008 Document Revised: 12/14/2017 Document Reviewed: 12/28/2016 Elsevier Patient Education  2020 [place] have a Stress Test scheduled at [facility]. Your doctor has ordered this test to check the blood flow in your heart arteries.  Please arrive 15 minutes early for paperwork. The whole test will take several hours. You may want to bring reading material to remain occupied while undergoing different parts of the test.  Instructions:  No food/drink after midnight the night before.  It is OK to take your morning meds with a sip of water EXCEPT for those types of medicines  listed below or otherwise instructed.  No caffeine/decaf products 24 hours before, including medicines such as Excedrin or Goody Powders. Call if there are any questions.   Wear comfortable clothes and shoes.   What To Expect: When you arrive in the lab, the technician will inject a small amount of radioactive tracer into your arm through an IV while you are resting quietly. This helps [place] to form pictures of your heart. You will likely only feel a sting from the IV. After a waiting period, resting pictures will be obtained under a big camera. These are the "before" pictures.  Next, you will be prepped for the stress portion of the test. This may include either walking on a treadmill or receiving a medicine that helps to dilate blood vessels in your heart to simulate the effect of exercise on your heart. If you are walking on a treadmill, you will walk at different paces to try to get your heart rate to a goal number that is based on your age. If your doctor has chosen the pharmacologic test, then you will receive a medicine through your IV that may cause temporary nausea, flushing, shortness of breath and sometimes chest discomfort or vomiting. This is typically short-lived and usually resolves quickly. If you experience symptoms, that does not automatically mean the test is abnormal. Some patients do not experience any symptoms at all. Your blood pressure and heart rate will be monitored, and we will be watching your EKG on a computer screen for any changes. During this portion of the test, the radiologist will inject another small amount of radioactive tracer into your IV. After a waiting period, you will undergo a second set of pictures. These are the "after" pictures.  The doctor reading the test will compare the before-and-after images to look for evidence of heart blockages or heart weakness. The test usually takes 1 day to complete, but in certain instances (for example, if a patient is over a  certain weight limit), the test may be done over the span of 2 days.

## 2018-10-11 NOTE — Progress Notes (Signed)
Progress Note  Patient Name: Neil [name] Date of Encounter: 10/11/2018  Primary Cardiologist: Ena Dawley, MD   Subjective   No CP or dyspnea  Inpatient Medications    Scheduled Meds: . acetaZOLAMIDE  250 mg Oral BID  . brimonidine  1 drop Both Eyes BID   And  . timolol  1 drop Both Eyes BID  . latanoprost  1 drop Both Eyes QHS  . levothyroxine  75 mcg Oral QAC breakfast  . multivitamin with minerals  1 tablet Oral Daily  . pantoprazole  40 mg Oral Daily  . pilocarpine  1 drop Left Eye QID  . vitamin B-12  1,000 mcg Oral Daily   Continuous Infusions: . amiodarone 30 mg/hr (10/10/18 0924)  . heparin 1,100 Units/hr (10/10/18 1259)   PRN Meds: acetaminophen, ondansetron (ZOFRAN) IV   Vital Signs    Vitals:   10/10/18 2215 10/11/18 0011 10/11/18 0100 10/11/18 0512  BP: (!) 147/80 114/75  129/76  Pulse:    61  Resp:   (!) 27 (!) 28  Temp:    99.9 F (37.7 C)  TempSrc:    Oral  SpO2:    100%  Weight:    78.1 kg  Height:        Intake/Output Summary (Last 24 hours) at 10/11/2018 0743 Last data filed at 10/10/2018 2200 Gross per 24 hour  Intake 276 ml  Output 825 ml  Net -549 ml   Last 3 Weights 10/11/2018 10/10/2018 10/09/2018  Weight (lbs) 172 lb 3.2 oz 171 lb 8 oz 171 lb 8 oz  Weight (kg) 78.109 kg 77.792 kg 77.792 kg      Telemetry    Sinus - Personally Reviewed   Physical Exam   GEN: No acute distress.   Neck: No JVD Cardiac: RRR, no murmurs, rubs, or gallops.  Respiratory: Clear to auscultation bilaterally. GI: Soft, nontender, non-distended  MS: No edema Neuro:  Nonfocal  Psych: Normal affect   Labs    High Sensitivity Troponin:   Recent Labs  Lab 10/09/18 1226 10/09/18 1426 10/10/18 1016  TROPONINIHS 272* 413* 636*      Chemistry Recent Labs  Lab 10/09/18 1226 10/10/18 0426  NA 135 137  K 3.4* 3.9  CL 105 109  CO2 21* 21*  GLUCOSE 112* 122*  BUN 21 16  CREATININE 1.22 1.01  CALCIUM 9.1 8.5*  GFRNONAA 53* >60  GFRAA  >60 >60  ANIONGAP 9 7     Hematology Recent Labs  Lab 10/09/18 1226 10/10/18 0426  WBC 8.7 7.7  RBC 4.61 4.18*  HGB 14.4 13.1  HCT 43.9 39.0  MCV 95.2 93.3  MCH 31.2 31.3  MCHC 32.8 33.6  RDW 13.3 13.2  PLT 181 164    Radiology    Dg Chest 2 View  Result Date: 10/09/2018 CLINICAL DATA:  Chest pain beginning last night.  Tachycardia. EXAM: CHEST - 2 VIEW COMPARISON:  None. FINDINGS: Heart size is normal. Mediastinal shadows are normal. The lungs are clear except for mild atelectasis or scar in the right middle lobe or lingula. Pulmonary vascularity is normal. No effusions. No acute bone finding. IMPRESSION: Probably no active disease. Minimal linear scarring or atelectasis in the right middle lobe or lingula. Electronically Signed   By: Nelson Chimes M.D.   On: 10/09/2018 13:06    Patient Profile     85 y.o. male with a PMH significant for HTN, psoriasis (on enbrel), hypothyroidism, and gluacoma who presented with rapid heart  rate, palpitations, and chest tightness found to be in new onset atrial flutter.   Assessment & Plan    1 typical atrial flutter-patient has converted to sinus rhythm.  Change IV amiodarone to oral form.  Will treat with 200 mg twice daily for 1 week then 200 mg daily thereafter.  Discontinue IV heparin.  Treat with apixaban 5 mg twice daily. Note LV function and TSH normal.  Follow-up Dr. Meda Coffee after discharge.  Can consider referral to electrophysiology for ablation to avoid long-term anticoagulation at that point.  2 mildly elevated troponin-likely demand ischemia in the setting of atrial flutter.  We will arrange [place] nuclear study following discharge.  3 hypertension-blood pressure is trending up.  Home blood pressure medications were held as patient was initially treated with Cardizem for rate control and developed hypotension.  Will resume lisinopril HCT 20/12.5 mg daily beginning tomorrow.  Check potassium and renal function in 1 week.  Follow  blood pressure at home and adjust regimen as needed.  We will continue off of amlodipine for now.  Plan discharge today.  Medications as outlined above.  Outpatient Lexiscan nuclear study. BMET one week. TOC appointment with APP 1-2 weeks. FU Dr Meda Coffee 3 months. >30 min PA and physician time D2  For questions or updates, please contact [place] Please consult [email or web address] for contact info under        Signed, Kirk Ruths, MD  10/11/2018, 7:43 AM

## 2018-10-14 SURGERY — ECHOCARDIOGRAM, TRANSESOPHAGEAL
Anesthesia: General

## 2018-10-22 ENCOUNTER — Telehealth (HOSPITAL_COMMUNITY): Payer: Self-pay

## 2018-10-22 NOTE — Telephone Encounter (Signed)
Spoke with the patient, instructions were given, and he stated that he understood and would be here. S.Jkwon Treptow EMTP

## 2018-10-25 ENCOUNTER — Other Ambulatory Visit: Payer: Self-pay

## 2018-10-25 ENCOUNTER — Ambulatory Visit (HOSPITAL_COMMUNITY): Payer: Medicare Other | Attending: Cardiology

## 2018-10-25 DIAGNOSIS — I483 Typical atrial flutter: Secondary | ICD-10-CM | POA: Diagnosis not present

## 2018-10-25 MED ORDER — TECHNETIUM TC 99M TETROFOSMIN IV KIT
30.5000 | PACK | Freq: Once | INTRAVENOUS | Status: AC | PRN
  Administered 2018-10-25: 30.5 via INTRAVENOUS
  Filled 2018-10-25: qty 31

## 2018-10-25 MED ORDER — TECHNETIUM TC 99M TETROFOSMIN IV KIT
9.6000 | PACK | Freq: Once | INTRAVENOUS | Status: AC | PRN
  Administered 2018-10-25: 9.6 via INTRAVENOUS
  Filled 2018-10-25: qty 10

## 2018-10-25 MED ORDER — REGADENOSON 0.4 MG/5ML IV SOLN
0.4000 mg | Freq: Once | INTRAVENOUS | Status: AC
  Administered 2018-10-25: 0.4 mg via INTRAVENOUS

## 2018-10-28 ENCOUNTER — Telehealth: Payer: Self-pay | Admitting: Cardiology

## 2018-10-28 LAB — MYOCARDIAL PERFUSION IMAGING
LV dias vol: 74 mL (ref 62–150)
LV sys vol: 24 mL
Peak HR: 74 {beats}/min
Rest HR: 57 {beats}/min
SDS: 0
SRS: 0
SSS: 0
TID: 0.95

## 2018-10-28 NOTE — Telephone Encounter (Signed)

## 2018-10-29 ENCOUNTER — Other Ambulatory Visit: Payer: Self-pay

## 2018-10-29 ENCOUNTER — Ambulatory Visit (INDEPENDENT_AMBULATORY_CARE_PROVIDER_SITE_OTHER): Payer: Medicare Other | Admitting: Cardiology

## 2018-10-29 ENCOUNTER — Encounter: Payer: Self-pay | Admitting: Cardiology

## 2018-10-29 VITALS — BP 134/80 | HR 56 | Ht 73.0 in | Wt 170.2 lb

## 2018-10-29 DIAGNOSIS — R0600 Dyspnea, unspecified: Secondary | ICD-10-CM | POA: Diagnosis not present

## 2018-10-29 DIAGNOSIS — R600 Localized edema: Secondary | ICD-10-CM

## 2018-10-29 DIAGNOSIS — I483 Typical atrial flutter: Secondary | ICD-10-CM

## 2018-10-29 DIAGNOSIS — Z79899 Other long term (current) drug therapy: Secondary | ICD-10-CM | POA: Diagnosis not present

## 2018-10-29 DIAGNOSIS — I480 Paroxysmal atrial fibrillation: Secondary | ICD-10-CM | POA: Diagnosis not present

## 2018-10-29 NOTE — Patient Instructions (Signed)
Medication Instructions:  none If you need a refill on your cardiac medications before your next appointment, please call your pharmacy.   Lab work:TODAY CBC BNP BMP If you have labs (blood work) drawn today and your tests are completely normal, you will receive your results only by: Marland Kitchen MyChart Message (if you have MyChart) OR . A paper copy in the mail If you have any lab test that is abnormal or we need to change your treatment, we will call you to review the results.  Testing/Procedures: NONE  Follow-Up: 3 MONTHS with Dr Meda Coffee per Lyda Jester, PA At [facility], you and your health needs are our priority.  As part of our continuing mission to provide you with exceptional heart care, we have created designated Provider Care Teams.  These Care Teams include your primary Cardiologist (physician) and Advanced Practice Providers (APPs -  Physician Assistants and Nurse Practitioners) who all work together to provide you with the care you need, when you need it. .   Any Other Special Instructions Will Be Listed Below (If Applicable).

## 2018-10-29 NOTE — Addendum Note (Signed)
Addended by: Marciano Sequin on: 10/29/2018 02:58 PM   Modules accepted: Orders

## 2018-10-29 NOTE — Progress Notes (Signed)
10/29/2018 Neil [name]   07-26-1929  [id number]  Primary Physician Antony Contras, MD Primary Cardiologist: Ena Dawley, MD  Electrophysiologist: None   Reason for Visit/CC: [facility] f/u for new onset atrial fibrillation and chest pain, s/p NST  HPI:  Neil [name] is a 85 y.o. male who is being seen today for post hospital f/u after recent admission for new onset symptomatic atrial fibrillation w/ palpitations and CP.   He is a 85 y.o.malewith a PMH significant for HTN, psoriasis (on enbrel), hypothyroidism, and gluacoma who recently presented to the hospital with rapid heart rate, palpitations, and chest tightness found to be in new onset atrial flutter. His HR was in the 130s-140s. Labs notable for K 3.4, Cr 1.22, CBC wnl, coags wnl, TSH wnl, Trop 272>413 (felt to be likely 2/2 demand ischemia 2/2 rapid atrial flutter but outpatient NST was recommended). He was started on a heparin gtt for stroke ppx given CHA2DS2-VASc Score of 3 (HTN, Age >75), and transitioned to apixaban 5mg  BID prior to discharge. Initial rate control with diltiazem gtt limited by hypotension. He was started on an amiodarone gtt and converted to NSR 10/10/2018. He was recommended to continue po amiodarone 200mg  BID x1 week, then 200mg  daily going forward.  Echo showed normal LVEF, 55-60% and no significant valvular disease. He had his outpatient NST done on 7/17. This was a low risk study and showed no ischemia. It should also be noted that Dr. Stanford Breed, who followed pt during hospitalization, recommended to consider EP referral for possible ablation if persistent atrial fibrillation to avoid long term anticoagulation.   He presents back to clinic today for f/u. Doing well from a heart standpoint. No recurrent CP. No palpitations. EKG shows NSR. HR well controlled. BP controlled. Reports full med compliance. No side effects. Denies abnormal bleeding w/ eliquis. No falls. He does have 1+ ankle edema on exam. Some  occasional dyspnea when he turns to lay on his left side but no exertional dyspnea. He is on a diuretic. He lives at [facility]. His wife has dementia. They will celebrate their 65th wedding anniversary this Thursday. They have 5 children together. He has been a bit down/ sadden by her deteriorating state.    Cardiac Studies   Echocardiogram 10/10/2018: 1. The left ventricle has normal systolic function, with an ejection fraction of 55-60%. The cavity size was normal. There is moderate concentric left ventricular hypertrophy. Left ventricular diastolic function could not be evaluated secondary to  atrial fibrillation. No evidence of left ventricular regional wall motion abnormalities. 2. The right ventricle has normal systolic function. The cavity was mildly enlarged. There is no increase in right ventricular wall thickness. Right ventricular systolic pressure is normal. 3. Right atrial size was moderately dilated. 4. The aortic valve is tricuspid. Mild thickening of the aortic valve. No stenosis of the aortic valve. 5. There is mild to moderate dilatation of the ascending aorta measuring 45 mm.  NST 10/25/18  Study Highlights    The left ventricular ejection fraction is hyperdynamic (>65%).  Nuclear stress EF: 68%. No wall motion abnormalities.  There was no ST segment deviation noted during stress.  This is a low risk study. No ischemia.   Neil Furbish, MD       Current Meds  Medication Sig  . acetaZOLAMIDE (DIAMOX) 250 MG tablet Take 250 mg by mouth 2 (two) times daily.  Marland Kitchen amiodarone (PACERONE) 200 MG tablet Take 1 tablet (200 mg total) by mouth daily.  Marland Kitchen  apixaban (ELIQUIS) 5 MG TABS tablet Take 1 tablet (5 mg total) by mouth 2 (two) times daily.  . bimatoprost (LUMIGAN) 0.01 % SOLN Place 1 drop into both eyes at bedtime.  . brimonidine-timolol (COMBIGAN) 0.2-0.5 % ophthalmic solution Place 1 drop into both eyes 2 (two) times a day.  . ENBREL 50 MG/ML injection Inject 1  mL into the skin once a week.  . levothyroxine (SYNTHROID) 75 MCG tablet Take 75 mcg by mouth daily before breakfast.  . lisinopril-hydrochlorothiazide (ZESTORETIC) 10-12.5 MG tablet Take 1 tablet by mouth daily.  . Multiple Vitamin (MULTIVITAMIN WITH MINERALS) TABS tablet Take 1 tablet by mouth daily.  Mckinley Jewel Dimesylate (RHOPRESSA) 0.02 % SOLN Place 1 drop into both eyes at bedtime.  Marland Kitchen omeprazole (PRILOSEC) 40 MG capsule Take 40 mg by mouth daily.  . pilocarpine (PILOCAR) 2 % ophthalmic solution Place 1 drop into the left eye 4 (four) times daily.  . vitamin B-12 (CYANOCOBALAMIN) 1000 MCG tablet Take 1,000 mcg by mouth daily.   No Known Allergies Past Medical History:  Diagnosis Date  . Glaucoma   . Hypertension   . Psoriasis    Family History  Problem Relation Age of Onset  . High blood pressure Mother   . Stroke Mother   . High blood pressure Sister    Past Surgical History:  Procedure Laterality Date  . GLAUCOMA SURGERY Left   . HERNIA REPAIR    . HYDROCELE EXCISION / REPAIR     Social History   Socioeconomic History  . Marital status: Married    Spouse name: Not on file  . Number of children: Not on file  . Years of education: Not on file  . Highest education level: Not on file  Occupational History  . Not on file  Social Needs  . Financial resource strain: Not on file  . Food insecurity    Worry: Not on file    Inability: Not on file  . Transportation needs    Medical: Not on file    Non-medical: Not on file  Tobacco Use  . Smoking status: Former Smoker    Types: Pipe  . Smokeless tobacco: Never Used  . Tobacco comment: quit >40 years ago  Substance and Sexual Activity  . Alcohol use: Yes    Alcohol/week: 7.0 standard drinks    Types: 7 Standard drinks or equivalent per week    Comment: 1 drink daily  . Drug use: Never  . Sexual activity: Not on file  Lifestyle  . Physical activity    Days per week: Not on file    Minutes per session: Not on  file  . Stress: Not on file  Relationships  . Social Herbalist on phone: Not on file    Gets together: Not on file    Attends religious service: Not on file    Active member of club or organization: Not on file    Attends meetings of clubs or organizations: Not on file    Relationship status: Not on file  . Intimate partner violence    Fear of current or ex partner: Not on file    Emotionally abused: Not on file    Physically abused: Not on file    Forced sexual activity: Not on file  Other Topics Concern  . Not on file  Social History Narrative   Wife has alzheimer's     Lipid Panel     Component Value Date/Time   CHOL  150 10/10/2018 0426   TRIG 64 10/10/2018 0426   HDL 36 (L) 10/10/2018 0426   CHOLHDL 4.2 10/10/2018 0426   VLDL 13 10/10/2018 0426   LDLCALC 101 (H) 10/10/2018 0426    Review of Systems: General: negative for chills, fever, night sweats or weight changes.  Cardiovascular: negative for chest pain, dyspnea on exertion, edema, orthopnea, palpitations, paroxysmal nocturnal dyspnea or shortness of breath Dermatological: negative for rash Respiratory: negative for cough or wheezing Urologic: negative for hematuria Abdominal: negative for nausea, vomiting, diarrhea, bright red blood per rectum, melena, or hematemesis Neurologic: negative for visual changes, syncope, or dizziness All other systems reviewed and are otherwise negative except as noted above.   Physical Exam:  Blood pressure 134/80, pulse (!) 56, height 6\' 1"  (1.854 m), weight 170 lb 3.2 oz (77.2 kg), SpO2 98 %.  General appearance: alert, cooperative and no distress Neck: no carotid bruit and no JVD Lungs: clear to auscultation bilaterally Heart: regular rate and rhythm, S1, S2 normal, no murmur, click, rub or gallop Extremities: 1+ bilateral ankle edema Pulses: 2+ and symmetric Skin: Skin color, texture, turgor normal. No rashes or lesions Neurologic: Grossly normal  EKG NSR, 56  bpm, LVH with repol abnormalities -- personally reviewed   ASSESSMENT AND PLAN:   1. Atrial Fibrillation: he converted w/ amiodarone. Maintaining NSR on EKG. No breakthrough symptoms. On Eliquis for anticoagulation. HR controlled. Will check f/u BMP and CBC given new Eliquis initiation.   2. Chest Pain: no further recurrence. Likely 2/2 rapid afib, which has resolved. NST showed no ischemia.   3. LEE: LVEF normal by recent echo. Diastolic function could not be adequately assessed at that time due to afib. LVH was noted. He has 1+ bilateral LEE on exam today and does have mild dyspnea at night when he turns on his left side to sleep. Will check BNP today. If elevated, will adjust diuretic.   4. HTN: controlled on current regimen. Recently started on lisinopril-hctz. Will check f/u BMP today.    Follow-Up w/ Dr. Meda Coffee in 3 months.   Brittainy Ladoris Gene, MHS [facility] 10/29/2018 2:44 PM

## 2018-10-30 LAB — BASIC METABOLIC PANEL
BUN/Creatinine Ratio: 12 (ref 10–24)
BUN: 15 mg/dL (ref 8–27)
CO2: 23 mmol/L (ref 20–29)
Calcium: 9.1 mg/dL (ref 8.6–10.2)
Chloride: 102 mmol/L (ref 96–106)
Creatinine, Ser: 1.29 mg/dL — ABNORMAL HIGH (ref 0.76–1.27)
GFR calc Af Amer: 57 mL/min/{1.73_m2} — ABNORMAL LOW (ref >59)
GFR calc non Af Amer: 50 mL/min/{1.73_m2} — ABNORMAL LOW (ref >59)
Glucose: 97 mg/dL (ref 65–99)
Potassium: 4 mmol/L (ref 3.5–5.2)
Sodium: 137 mmol/L (ref 134–144)

## 2018-10-30 LAB — CBC
Hematocrit: 38.9 % (ref 37.5–51.0)
Hemoglobin: 13.6 g/dL (ref 13.0–17.7)
MCH: 31.8 pg (ref 26.6–33.0)
MCHC: 35 g/dL (ref 31.5–35.7)
MCV: 91 fL (ref 79–97)
Platelets: 191 10*3/uL (ref 150–450)
RBC: 4.28 x10E6/uL (ref 4.14–5.80)
RDW: 13.4 % (ref 11.6–15.4)
WBC: 7.3 10*3/uL (ref 3.4–10.8)

## 2018-10-30 LAB — PRO B NATRIURETIC PEPTIDE: NT-Pro BNP: 555 pg/mL — ABNORMAL HIGH (ref 0–486)

## 2018-10-31 ENCOUNTER — Telehealth: Payer: Self-pay

## 2018-10-31 NOTE — Telephone Encounter (Signed)
-----   Message from Consuelo Pandy, [place] sent at 10/31/2018  1:36 PM EDT ----- Labs look ok. BNP (fluid marker) normal range for his age. No CHF. Continue current meds as he is taking. No changes.

## 2018-10-31 NOTE — Telephone Encounter (Signed)
Notes recorded by Frederik Schmidt, RN on 10/31/2018 at 1:51 PM EDT  Lpmtcb 7/23  ------

## 2018-10-31 NOTE — Telephone Encounter (Signed)
Notes recorded by Frederik Schmidt, RN on 10/31/2018 at 2:54 PM EDT  The patient has been notified of the result and verbalized understanding. All questions (if any) were answered.  Frederik Schmidt, RN 10/31/2018 2:54 PM   ------

## 2018-10-31 NOTE — Telephone Encounter (Signed)
Patient returned call

## 2018-11-08 DIAGNOSIS — Z961 Presence of intraocular lens: Secondary | ICD-10-CM | POA: Diagnosis not present

## 2018-11-08 DIAGNOSIS — H401122 Primary open-angle glaucoma, left eye, moderate stage: Secondary | ICD-10-CM | POA: Diagnosis not present

## 2018-11-08 DIAGNOSIS — Z9889 Other specified postprocedural states: Secondary | ICD-10-CM | POA: Diagnosis not present

## 2018-12-16 DIAGNOSIS — Z23 Encounter for immunization: Secondary | ICD-10-CM | POA: Diagnosis not present

## 2018-12-28 ENCOUNTER — Other Ambulatory Visit: Payer: Self-pay | Admitting: Medical

## 2019-02-07 ENCOUNTER — Ambulatory Visit: Payer: Medicare Other | Admitting: Cardiology

## 2019-02-12 ENCOUNTER — Ambulatory Visit (INDEPENDENT_AMBULATORY_CARE_PROVIDER_SITE_OTHER): Payer: Medicare Other | Admitting: Cardiology

## 2019-02-12 ENCOUNTER — Encounter: Payer: Self-pay | Admitting: Cardiology

## 2019-02-12 ENCOUNTER — Other Ambulatory Visit: Payer: Self-pay

## 2019-02-12 VITALS — BP 122/78 | HR 48 | Ht 73.0 in | Wt 168.0 lb

## 2019-02-12 DIAGNOSIS — Z79899 Other long term (current) drug therapy: Secondary | ICD-10-CM | POA: Diagnosis not present

## 2019-02-12 DIAGNOSIS — R600 Localized edema: Secondary | ICD-10-CM | POA: Diagnosis not present

## 2019-02-12 DIAGNOSIS — E785 Hyperlipidemia, unspecified: Secondary | ICD-10-CM | POA: Diagnosis not present

## 2019-02-12 DIAGNOSIS — I483 Typical atrial flutter: Secondary | ICD-10-CM

## 2019-02-12 DIAGNOSIS — R0600 Dyspnea, unspecified: Secondary | ICD-10-CM | POA: Diagnosis not present

## 2019-02-12 DIAGNOSIS — I480 Paroxysmal atrial fibrillation: Secondary | ICD-10-CM

## 2019-02-12 LAB — CBC
Hematocrit: 38.5 % (ref 37.5–51.0)
Hemoglobin: 13 g/dL (ref 13.0–17.7)
MCH: 31.9 pg (ref 26.6–33.0)
MCHC: 33.8 g/dL (ref 31.5–35.7)
MCV: 95 fL (ref 79–97)
Platelets: 181 10*3/uL (ref 150–450)
RBC: 4.07 x10E6/uL — ABNORMAL LOW (ref 4.14–5.80)
RDW: 13.2 % (ref 11.6–15.4)
WBC: 7.3 10*3/uL (ref 3.4–10.8)

## 2019-02-12 LAB — LIPID PANEL
Chol/HDL Ratio: 3.3 ratio (ref 0.0–5.0)
Cholesterol, Total: 160 mg/dL (ref 100–199)
HDL: 48 mg/dL (ref >39)
LDL Chol Calc (NIH): 99 mg/dL (ref 0–99)
Triglycerides: 68 mg/dL (ref 0–149)
VLDL Cholesterol Cal: 13 mg/dL (ref 5–40)

## 2019-02-12 LAB — COMPREHENSIVE METABOLIC PANEL
ALT: 8 IU/L (ref 0–44)
AST: 11 IU/L (ref 0–40)
Albumin/Globulin Ratio: 1.5 (ref 1.2–2.2)
Albumin: 4.1 g/dL (ref 3.6–4.6)
Alkaline Phosphatase: 73 IU/L (ref 39–117)
BUN/Creatinine Ratio: 15 (ref 10–24)
BUN: 19 mg/dL (ref 8–27)
Bilirubin Total: 0.4 mg/dL (ref 0.0–1.2)
CO2: 22 mmol/L (ref 20–29)
Calcium: 9 mg/dL (ref 8.6–10.2)
Chloride: 102 mmol/L (ref 96–106)
Creatinine, Ser: 1.26 mg/dL (ref 0.76–1.27)
GFR calc Af Amer: 59 mL/min/{1.73_m2} — ABNORMAL LOW (ref >59)
GFR calc non Af Amer: 51 mL/min/{1.73_m2} — ABNORMAL LOW (ref >59)
Globulin, Total: 2.8 g/dL (ref 1.5–4.5)
Glucose: 114 mg/dL — ABNORMAL HIGH (ref 65–99)
Potassium: 4 mmol/L (ref 3.5–5.2)
Sodium: 136 mmol/L (ref 134–144)
Total Protein: 6.9 g/dL (ref 6.0–8.5)

## 2019-02-12 LAB — TSH: TSH: 2.92 u[IU]/mL (ref 0.450–4.500)

## 2019-02-12 NOTE — Patient Instructions (Signed)
Medication Instructions:   STOP TAKING AMIODARONE NOW  DR. Meda Coffee HAS PRESCRIBED FOR YOU TO WEAR COMPRESSION STOCKINGS EVERYDAY.  PLEASE TAKE THIS PRESCRIPTION TO YOUR LOCAL MEDICAL SUPPLY STORE TO HAVE FILLED.   *If you need a refill on your cardiac medications before your next appointment, please call your pharmacy*    Lab Work:  TODAY-CMET, CBC, TSH, AND LIPIDS  If you have labs (blood work) drawn today and your tests are completely normal, you will receive your results only by: Marland Kitchen MyChart Message (if you have MyChart) OR . A paper copy in the mail If you have any lab test that is abnormal or we need to change your treatment, we will call you to review the results.    Follow-Up: At [facility], you and your health needs are our priority.  As part of our continuing mission to provide you with exceptional heart care, we have created designated Provider Care Teams.  These Care Teams include your primary Cardiologist (physician) and Advanced Practice Providers (APPs -  Physician Assistants and Nurse Practitioners) who all work together to provide you with the care you need, when you need it.  Your next appointment:   4 months-ON June 12, 2019 AT 10:00 AM  The format for your next appointment:   Either In Person or Virtual  Provider:   Ena Dawley, MD

## 2019-02-12 NOTE — Progress Notes (Signed)
02/12/2019 Neil [name]   [birth year]  [id number]  Primary Physician Antony Contras, MD Primary Cardiologist: Ena Dawley, MD  Electrophysiologist: None   Reason for Visit/CC: [facility] f/u for new onset atrial fibrillation and chest pain, s/p NST  HPI:  Neil [name] is a 85 y.o. male who is being seen today for post hospital f/u after recent admission for new onset symptomatic atrial fibrillation w/ palpitations and CP.   He is a 85 y.o.malewith a PMH significant for HTN, psoriasis (on enbrel), hypothyroidism, and gluacoma who recently presented to the hospital with rapid heart rate, palpitations, and chest tightness found to be in new onset atrial flutter. His HR was in the 130s-140s. Labs notable for K 3.4, Cr 1.22, CBC wnl, coags wnl, TSH wnl, Trop 272>413 (felt to be likely 2/2 demand ischemia 2/2 rapid atrial flutter but outpatient NST was recommended). He was started on a heparin gtt for stroke ppx given CHA2DS2-VASc Score of 3 (HTN, Age >75), and transitioned to apixaban 5mg  BID prior to discharge. Initial rate control with diltiazem gtt limited by hypotension. He was started on an amiodarone gtt and converted to NSR 10/10/2018. He was recommended to continue po amiodarone 200mg  BID x1 week, then 200mg  daily going forward.  Echo showed normal LVEF, 55-60% and no significant valvular disease. He had his outpatient NST done on 7/17. This was a low risk study and showed no ischemia. It should also be noted that Dr. Stanford Breed, who followed pt during hospitalization, recommended to consider EP referral for possible ablation if persistent atrial fibrillation to avoid long term anticoagulation.   He presents back to clinic today for f/u. Doing well from a heart standpoint. No recurrent CP. No palpitations. EKG shows NSR. HR well controlled. BP controlled. Reports full med compliance. No side effects. Denies abnormal bleeding w/ eliquis. No falls. He does have 1+ ankle edema on exam. Some  occasional dyspnea when he turns to lay on his left side but no exertional dyspnea. He is on a diuretic. He lives at [facility]. His wife has dementia. They will celebrate their 65th wedding anniversary this Thursday. They have 5 children together. He has been a bit down/ sadden by her deteriorating state.    02/12/2019 -this is 3 months follow-up, the patient is doing well, he denies any palpitations, no dizziness or falls.  No orthostatic hypotension.  He continues to have lower extremity edema more on the left.  Cardiac Studies   Echocardiogram 10/10/2018: 1. The left ventricle has normal systolic function, with an ejection fraction of 55-60%. The cavity size was normal. There is moderate concentric left ventricular hypertrophy. Left ventricular diastolic function could not be evaluated secondary to  atrial fibrillation. No evidence of left ventricular regional wall motion abnormalities. 2. The right ventricle has normal systolic function. The cavity was mildly enlarged. There is no increase in right ventricular wall thickness. Right ventricular systolic pressure is normal. 3. Right atrial size was moderately dilated. 4. The aortic valve is tricuspid. Mild thickening of the aortic valve. No stenosis of the aortic valve. 5. There is mild to moderate dilatation of the ascending aorta measuring 45 mm.  NST 10/25/18  Study Highlights    The left ventricular ejection fraction is hyperdynamic (>65%).  Nuclear stress EF: 68%. No wall motion abnormalities.  There was no ST segment deviation noted during stress.  This is a low risk study. No ischemia.   Candee Furbish, MD       Current Meds  Medication Sig  . acetaZOLAMIDE (DIAMOX) 250 MG tablet Take 250 mg by mouth daily.   Marland Kitchen amiodarone (PACERONE) 200 MG tablet TAKE 1 TABLET BY MOUTH EVERY DAY  . apixaban (ELIQUIS) 5 MG TABS tablet Take 1 tablet (5 mg total) by mouth 2 (two) times daily.  . bimatoprost (LUMIGAN) 0.01 % SOLN Place 1  drop into both eyes at bedtime.  . brimonidine-timolol (COMBIGAN) 0.2-0.5 % ophthalmic solution Place 1 drop into both eyes 2 (two) times a day.  . ENBREL 50 MG/ML injection Inject 1 mL into the skin once a week.  . levothyroxine (SYNTHROID) 75 MCG tablet Take 75 mcg by mouth daily before breakfast.  . lisinopril-hydrochlorothiazide (ZESTORETIC) 10-12.5 MG tablet Take 1 tablet by mouth daily.  . Multiple Vitamin (MULTIVITAMIN WITH MINERALS) TABS tablet Take 1 tablet by mouth daily.  Mckinley Jewel Dimesylate (RHOPRESSA) 0.02 % SOLN Place 1 drop into both eyes at bedtime.  Marland Kitchen omeprazole (PRILOSEC) 40 MG capsule Take 40 mg by mouth daily.  . pilocarpine (PILOCAR) 2 % ophthalmic solution Place 1 drop into the left eye 4 (four) times daily.  . vitamin B-12 (CYANOCOBALAMIN) 1000 MCG tablet Take 1,000 mcg by mouth daily.   No Known Allergies Past Medical History:  Diagnosis Date  . Glaucoma   . Hypertension   . Psoriasis    Family History  Problem Relation Age of Onset  . High blood pressure Mother   . Stroke Mother   . High blood pressure Sister    Past Surgical History:  Procedure Laterality Date  . GLAUCOMA SURGERY Left   . HERNIA REPAIR    . HYDROCELE EXCISION / REPAIR     Social History   Socioeconomic History  . Marital status: Married    Spouse name: Not on file  . Number of children: Not on file  . Years of education: Not on file  . Highest education level: Not on file  Occupational History  . Not on file  Social Needs  . Financial resource strain: Not on file  . Food insecurity    Worry: Not on file    Inability: Not on file  . Transportation needs    Medical: Not on file    Non-medical: Not on file  Tobacco Use  . Smoking status: Former Smoker    Types: Pipe  . Smokeless tobacco: Never Used  . Tobacco comment: quit >40 years ago  Substance and Sexual Activity  . Alcohol use: Yes    Alcohol/week: 7.0 standard drinks    Types: 7 Standard drinks or equivalent per  week    Comment: 1 drink daily  . Drug use: Never  . Sexual activity: Not on file  Lifestyle  . Physical activity    Days per week: Not on file    Minutes per session: Not on file  . Stress: Not on file  Relationships  . Social Herbalist on phone: Not on file    Gets together: Not on file    Attends religious service: Not on file    Active member of club or organization: Not on file    Attends meetings of clubs or organizations: Not on file    Relationship status: Not on file  . Intimate partner violence    Fear of current or ex partner: Not on file    Emotionally abused: Not on file    Physically abused: Not on file    Forced sexual activity: Not on file  Other  Topics Concern  . Not on file  Social History Narrative   Wife has alzheimer's     Lipid Panel     Component Value Date/Time   CHOL 150 10/10/2018 0426   TRIG 64 10/10/2018 0426   HDL 36 (L) 10/10/2018 0426   CHOLHDL 4.2 10/10/2018 0426   VLDL 13 10/10/2018 0426   LDLCALC 101 (H) 10/10/2018 0426    Review of Systems: General: negative for chills, fever, night sweats or weight changes.  Cardiovascular: negative for chest pain, dyspnea on exertion, edema, orthopnea, palpitations, paroxysmal nocturnal dyspnea or shortness of breath Dermatological: negative for rash Respiratory: negative for cough or wheezing Urologic: negative for hematuria Abdominal: negative for nausea, vomiting, diarrhea, bright red blood per rectum, melena, or hematemesis Neurologic: negative for visual changes, syncope, or dizziness All other systems reviewed and are otherwise negative except as noted above.   Physical Exam:  Blood pressure 122/78, pulse (!) 48, height 6\' 1"  (1.854 m), weight 168 lb (76.2 kg), SpO2 99 %.  General appearance: alert, cooperative and no distress Neck: no carotid bruit and no JVD Lungs: clear to auscultation bilaterally Heart: regular rate and rhythm, S1, S2 normal, no murmur, click, rub or  gallop Extremities: 1+ bilateral ankle edema Pulses: 2+ and symmetric Skin: Skin color, texture, turgor normal. No rashes or lesions Neurologic: Grossly normal  EKG NSR, 56 bpm, LVH with repol abnormalities -- personally reviewed   ASSESSMENT AND PLAN:   1. Atrial Fibrillation: he converted w/ amiodarone. Maintaining NSR, heart rate regular and 48 bpm.  Will discontinue amiodarone check TSH today.  I would continue Eliquis.  Check CBC today.  2. Chest Pain: no further recurrence. Likely 2/2 rapid afib, which has resolved. NST showed no ischemia.   3. LEE: LVEF normal by recent echo. Diastolic function could not be adequately assessed at that time due to afib.  I will start compression socks bilaterally.  I am not going to check the VQ scan as he is already on Eliquis.  4. HTN: controlled on current regimen. Recently started on lisinopril-hctz. Will check f/u BMP today.   Check CBC, CMP, TSH and lipids today.  Follow-up in 4 months.  Ena Dawley , MD [facility] 02/12/2019 9:58 AM

## 2019-02-13 DIAGNOSIS — Z20828 Contact with and (suspected) exposure to other viral communicable diseases: Secondary | ICD-10-CM | POA: Diagnosis not present

## 2019-02-14 DIAGNOSIS — Z20828 Contact with and (suspected) exposure to other viral communicable diseases: Secondary | ICD-10-CM | POA: Diagnosis not present

## 2019-02-18 ENCOUNTER — Telehealth: Payer: Self-pay | Admitting: Cardiology

## 2019-02-18 NOTE — Telephone Encounter (Signed)
Spoke with the pt and he will come in for EKG nurse visit tomorrow 11/11 at 2:30 pm.  Our DOD Dr. Radford Pax will review his EKG and then we will get this interpretation to Dr. Meda Coffee to further review and advise on his medication regimen.  Pt is aware of appt date and time.  Dr. Meda Coffee is also aware of this plan. Pt verbalized understanding and agrees with this plan.

## 2019-02-18 NOTE — Telephone Encounter (Signed)
I will try to get him in for a nurse visit.  Are you looking for afib?  And do you want to proceed with the medication changes you mentioned below, or wait until EKG? Please advise!

## 2019-02-18 NOTE — Telephone Encounter (Signed)
I need to know what rhythm he is in before doing any changes

## 2019-02-18 NOTE — Telephone Encounter (Signed)
Pt c/o medication issue:  1. Name of Medication:   apixaban (ELIQUIS) 5 MG TABS tablet   2. How are you currently taking this medication (dosage and times per day)? Take 1 tablet (5 mg total) by mouth 2 (two) times daily  3. Are you having a reaction (difficulty breathing--STAT)?   4. What is your medication issue? Patient states he is experiencing some weakness when he take his Eliquis in the morning. BP: 137/72 HR 56 before he took Eliquis the morning  96/54 HR 52  94/54 HR 50  90/49HR 45 He states this is pattern, he has been following it for a few days now. He states he feels very tired for a few hours.

## 2019-02-18 NOTE — Telephone Encounter (Signed)
Pt is calling to let Dr. Meda Coffee know that ever since he was taken off of amiodarone on 11/4 OV, he has been feeling weak, lethargic in the morning times, and when he goes to check his BP/HR, his numbers are all over the place.  Pt has no complaints of chest pain, palpitations, fluttering, sob, doe, N/V, pre-syncopal or syncopal episodes.  He states he generally just feels "blah." Pt had labs done at his OV last week with Dr. Meda Coffee, and all labs came back normal.  Pt states he is very aware he should be hydrating better, and will do this today by drinking more water.  Pt has no acute complaints at this time.  Pt states in the afternoon time, he starts feeling better. Pt is inquiring if his symptoms are coming from his amiodarone being discontinued, or is it another med like his eliquis or zestoretic. Advised the pt to continue his current med regimen as prescribed, and hydrate well with water today. Informed him that I will route this communication to Dr. Meda Coffee to further review and advise on.  Informed him that once further recommendations are received, I will follow-up with him shortly thereafter.  Pt verbalized understanding and agrees with this plan.

## 2019-02-18 NOTE — Telephone Encounter (Signed)
Please continue eliquis, discontinue zestoretic and start lisinopril 5 mg po daily

## 2019-02-18 NOTE — Telephone Encounter (Signed)
Can you get him in for an ECG?

## 2019-02-19 ENCOUNTER — Telehealth: Payer: Self-pay | Admitting: *Deleted

## 2019-02-19 ENCOUNTER — Other Ambulatory Visit: Payer: Self-pay

## 2019-02-19 ENCOUNTER — Ambulatory Visit (INDEPENDENT_AMBULATORY_CARE_PROVIDER_SITE_OTHER): Payer: Medicare Other | Admitting: *Deleted

## 2019-02-19 VITALS — BP 148/72 | HR 63 | Ht 73.0 in | Wt 168.8 lb

## 2019-02-19 DIAGNOSIS — I483 Typical atrial flutter: Secondary | ICD-10-CM | POA: Diagnosis not present

## 2019-02-19 DIAGNOSIS — I480 Paroxysmal atrial fibrillation: Secondary | ICD-10-CM

## 2019-02-19 NOTE — Telephone Encounter (Signed)
Patient is here for appointment. He stated that he was Covid 19 tested last Friday, because his wife was exposed. Per patient Covid,testing  "negative" results.

## 2019-02-19 NOTE — Telephone Encounter (Signed)
Spoke with the pt and informed him that Dr. Meda Coffee reviewed his EKG and she endorsed that he is in NSR, and he does not need to restart his amiodarone.   Informed the pt that she recommends he continue his current med regimen, and continue to monitor his BP/HR and report any critical findings.  Informed the pt that Dr. Meda Coffee recommends that he do follow-up with his PCP, for she advised he will need further evaluation for his lethargy and feeling fatigued, especially in the morning time.  Pt verbalized understanding and agrees with this plan.

## 2019-02-19 NOTE — Progress Notes (Signed)
1.) Reason for visit: EKG for follow-up of discontinued Amiodarone 1 week ago and pt complaining of BP/HR being all over the place and feeling lethargic in the morning time.  2.) Name of MD requesting visit: EKG per Dr. Meda Coffee  3.) H&P: history of PAF and a-flutter  4.) ROS related to problem: pt here for nurse visit EKG as ordered by Dr. Meda Coffee.  Pt called in yesterday with complaints of HR/BP being all over the place since stopping Amiodarone 1 week ago, at his last OV with Dr. Meda Coffee.  He also complained yesterday that he feels lethargic and "blah" in the morning times.  Per Dr. Meda Coffee, before further med changes can be made, he will need an EKG done today.   BP today was 148/72 and HR-63.  EKG done and showed to DOD Dr. Radford Pax.  Pt was noted on his EKG to be in NSR with a HR of 63 bpm.  Pt was instructed that we will call him later with Dr. Francesca Oman further recommendations based on his EKG tracing from today.  EKG will be scanned in his chart today by Medical Records, for Dr. Meda Coffee to review.   5.) Assessment and plan per MD: Follow-up with Dr. Meda Coffee as previously scheduled, and we will call you later with further recommendations from her, once she reviews your EKG.

## 2019-02-19 NOTE — Patient Instructions (Signed)
Medication Instructions:   Your physician recommends that you continue on your current medications as directed. Please refer to the Current Medication list given to you today.  *If you need a refill on your cardiac medications before your next appointment, please call your pharmacy*    Follow-Up: At [facility], you and your health needs are our priority.  As part of our continuing mission to provide you with exceptional heart care, we have created designated Provider Care Teams.  These Care Teams include your primary Cardiologist (physician) and Advanced Practice Providers (APPs -  Physician Assistants and Nurse Practitioners) who all work together to provide you with the care you need, when you need it.  Your next appointment:   as planned with Dr. Meda Coffee for 06/12/2019 at 10 am  The format for your next appointment:   In Person  Provider:   Ena Dawley, MD  Other Instructions  I WILL [place] DR. NELSON BASED ON YOUR EKG TRACINGS.

## 2019-02-28 DIAGNOSIS — Z961 Presence of intraocular lens: Secondary | ICD-10-CM | POA: Diagnosis not present

## 2019-02-28 DIAGNOSIS — Z9889 Other specified postprocedural states: Secondary | ICD-10-CM | POA: Diagnosis not present

## 2019-02-28 DIAGNOSIS — H401122 Primary open-angle glaucoma, left eye, moderate stage: Secondary | ICD-10-CM | POA: Diagnosis not present

## 2019-03-28 ENCOUNTER — Telehealth: Payer: Self-pay | Admitting: *Deleted

## 2019-03-28 NOTE — Telephone Encounter (Signed)
   [facility] Pre-operative Risk Assessment    Request for surgical clearance:  1. What type of surgery is being performed? XEN OS   2. When is this surgery scheduled? TBD  3. What type of clearance is required (medical clearance vs. Pharmacy clearance to hold med vs. Both)? BOTH  4. Are there any medications that need to be held prior to surgery and how long? ELIQUIS   5. Practice name and name of physician performing surgery? [facility] DR. Katy Fitch   6. What is your office phone number [phone]    7.   What is your office fax number [phone]  8.   Anesthesia type (None, local, MAC, general) ? MAC   Julaine Hua 03/28/2019, 9:15 AM  _________________________________________________________________   (provider comments below)

## 2019-03-28 NOTE — Telephone Encounter (Signed)
Patient with diagnosis of ATRIAL FIBRILLATION on ELIQUIS for anticoagulation.    Procedure: XEN Gel OS stent Date of procedure: TBD  CHADS2-VASc score of  3 (HTN, AGE x 2)  CrCl = 64ml/min Platelet count = 181  Per office protocol, patient can hold ELIQUIS for 2 days prior to procedure.  patient should restart ELIQUIS as soon as possible at discretion of procedure MD.

## 2019-03-28 NOTE — Telephone Encounter (Signed)
   Primary Cardiologist: Ena Dawley, MD  Chart reviewed as part of pre-operative protocol coverage. Cataract extractions are recognized in guidelines as low risk surgeries that do not typically require specific preoperative testing or holding of blood thinner therapy. Therefore, given past medical history and time since last visit, based on ACC/AHA guidelines, [name] would be at acceptable risk for the planned procedure without further cardiovascular testing.   His chart has been reviewed by a pharmacist. He has a CHADS2VASc score of 3 (HTN, age x2). CrCl = 23ml/min and platelet count=181. Per office protocol, patient can hold ELIQUIS for 2 days prior to procedure.  patient should restart ELIQUIS as soon as possible at discretion of procedure MD.  I will route this recommendation to the requesting party via [facility] [place] fax function and remove from pre-op pool.  Please call with questions.  Loel Dubonnet, NP 03/28/2019, 2:02 PM

## 2019-04-10 DIAGNOSIS — H401123 Primary open-angle glaucoma, left eye, severe stage: Secondary | ICD-10-CM | POA: Diagnosis not present

## 2019-04-14 DIAGNOSIS — Z23 Encounter for immunization: Secondary | ICD-10-CM | POA: Diagnosis not present

## 2019-04-29 ENCOUNTER — Telehealth: Payer: Self-pay | Admitting: Cardiology

## 2019-04-29 NOTE — Telephone Encounter (Signed)
   [facility] Pre-operative Risk Assessment    Request for surgical clearance:  1. What type of surgery is being performed? BLEB needling OS   2. When is this surgery scheduled? TBD   3. What type of clearance is required (medical clearance vs. Pharmacy clearance to hold med vs. Both)?  Medication  4. Are there any medications that need to be held prior to surgery and how long? Eliquis- how long is safe?   5. Practice name and name of physician performing surgery?  [facility]   6. What is your office phone number? [phone]    7.   What is your office fax number? [phone]  8.   Anesthesia type (None, local, MAC, general) ? local   _________________________________________________________________   (provider comments below)

## 2019-04-30 NOTE — Telephone Encounter (Signed)
   Primary Cardiologist: Ena Dawley, MD  Chart reviewed as part of pre-operative protocol coverage. As per recent documentation on 03/28/19 per our pharmacist (no new labs since that time), the patient can hold ELIQUIS for 2 days prior to procedure. Patient should restart ELIQUIS as soon as possible at discretion of procedure MD.  I will route this recommendation to the requesting party via Epic fax function and remove from pre-op pool.  Please call with questions.  Daune Perch, NP 04/30/2019, 8:23 AM

## 2019-05-08 DIAGNOSIS — H401122 Primary open-angle glaucoma, left eye, moderate stage: Secondary | ICD-10-CM | POA: Diagnosis not present

## 2019-05-08 DIAGNOSIS — Z9889 Other specified postprocedural states: Secondary | ICD-10-CM | POA: Diagnosis not present

## 2019-05-12 DIAGNOSIS — Z23 Encounter for immunization: Secondary | ICD-10-CM | POA: Diagnosis not present

## 2019-05-16 ENCOUNTER — Other Ambulatory Visit: Payer: Self-pay | Admitting: Medical

## 2019-05-16 NOTE — Telephone Encounter (Signed)
Prescription refill request for Eliquis received.  Last office visit: 11/4/2020Meda Coffee Scr: 1.26, 02/12/2019 Age: 86 y.o. Weight: 76.2kg  Prescription refill sent.

## 2019-06-12 ENCOUNTER — Encounter: Payer: Self-pay | Admitting: Cardiology

## 2019-06-12 ENCOUNTER — Ambulatory Visit (INDEPENDENT_AMBULATORY_CARE_PROVIDER_SITE_OTHER): Payer: Medicare Other | Admitting: Cardiology

## 2019-06-12 ENCOUNTER — Other Ambulatory Visit: Payer: Self-pay

## 2019-06-12 VITALS — BP 146/76 | HR 72 | Ht 73.0 in | Wt 177.4 lb

## 2019-06-12 DIAGNOSIS — R600 Localized edema: Secondary | ICD-10-CM

## 2019-06-12 DIAGNOSIS — E785 Hyperlipidemia, unspecified: Secondary | ICD-10-CM

## 2019-06-12 DIAGNOSIS — Z79899 Other long term (current) drug therapy: Secondary | ICD-10-CM | POA: Diagnosis not present

## 2019-06-12 DIAGNOSIS — I480 Paroxysmal atrial fibrillation: Secondary | ICD-10-CM

## 2019-06-12 NOTE — Patient Instructions (Signed)

## 2019-06-12 NOTE — Progress Notes (Signed)
06/12/2019 Neil [name]   1929-05-19  [id number]  Primary Physician Neil Contras, MD Primary Cardiologist: Neil Dawley, MD  Electrophysiologist: None   Reason for Visit/CC: [facility] f/u for new onset atrial fibrillation and chest pain, s/p NST  HPI:  [name] is a 86 y.o. male who is being seen today for post hospital f/u after recent admission for new onset symptomatic atrial fibrillation w/ palpitations and CP.   He is a 86 y.o.malewith a PMH significant for HTN, psoriasis (on enbrel), hypothyroidism, and gluacoma who recently presented to the hospital with rapid heart rate, palpitations, and chest tightness found to be in new onset atrial flutter. His HR was in the 130s-140s. Labs notable for K 3.4, Cr 1.22, CBC wnl, coags wnl, TSH wnl, Trop 272>413 (felt to be likely 2/2 demand ischemia 2/2 rapid atrial flutter but outpatient NST was recommended). He was started on a heparin gtt for stroke ppx given CHA2DS2-VASc Score of 3 (HTN, Age >75), and transitioned to apixaban 5mg  BID prior to discharge. Initial rate control with diltiazem gtt limited by hypotension. He was started on an amiodarone gtt and converted to NSR 10/10/2018. He was recommended to continue po amiodarone 200mg  BID x1 week, then 200mg  daily going forward.  Echo showed normal LVEF, 55-60% and no significant valvular disease. He had his outpatient NST done on 7/17. This was a low risk study and showed no ischemia. It should also be noted that Dr. Stanford [name], who followed pt during hospitalization, recommended to consider EP referral for possible ablation if persistent atrial fibrillation to avoid long term anticoagulation.   He presents back to clinic today for f/u. Doing well from a heart standpoint. No recurrent CP. No palpitations. EKG shows NSR. HR well controlled. BP controlled. Reports full med compliance. No side effects. Denies abnormal bleeding w/ eliquis. No falls. He does have 1+ ankle edema on exam. Some  occasional dyspnea when he turns to lay on his left side but no exertional dyspnea. He is on a diuretic. He lives at [name]. His wife has dementia. They will celebrate their 65th wedding anniversary this Thursday. They have 5 children together. He has been a bit down/ sadden by her deteriorating state.   06/12/2019 -this is 6 months follow-up, the patient is doing well, he was able to obtain COVID-19 vaccine.  He denies any palpitations.  He stays active and walks 30 minutes a day, sometimes he gets tired but denies any chest pain or shortness of breath.  His blood pressure remains in the range 115-145, he does not have any orthostatic hypotension, since we discontinued amiodarone he has more energy.  Cardiac Studies   Echocardiogram 10/10/2018: 1. The left ventricle has normal systolic function, with an ejection fraction of 55-60%. The cavity size was normal. There is moderate concentric left ventricular hypertrophy. Left ventricular diastolic function could not be evaluated secondary to  atrial fibrillation. No evidence of left ventricular regional wall motion abnormalities. 2. The right ventricle has normal systolic function. The cavity was mildly enlarged. There is no increase in right ventricular wall thickness. Right ventricular systolic pressure is normal. 3. Right atrial size was moderately dilated. 4. The aortic valve is tricuspid. Mild thickening of the aortic valve. No stenosis of the aortic valve. 5. There is mild to moderate dilatation of the ascending aorta measuring 45 mm.  NST 10/25/18  Study Highlights    The left ventricular ejection fraction is hyperdynamic (>65%).  Nuclear stress EF: 68%. No wall  motion abnormalities.  There was no ST segment deviation noted during stress.  This is a low risk study. No ischemia.   Neil Furbish, MD    Current Meds  Medication Sig  . bimatoprost (LUMIGAN) 0.01 % SOLN Place 1 drop into both eyes at bedtime.  .  brimonidine-timolol (COMBIGAN) 0.2-0.5 % ophthalmic solution Place 1 drop into both eyes 2 (two) times a day.  . dorzolamide (TRUSOPT) 2 % ophthalmic solution Place 1 drop into the right eye 2 (two) times daily.  Marland Kitchen ELIQUIS 5 MG TABS tablet TAKE 1 TABLET BY MOUTH TWICE A DAY  . ENBREL 50 MG/ML injection Inject 1 mL into the skin once a week.  . levothyroxine (SYNTHROID) 75 MCG tablet Take 75 mcg by mouth daily before breakfast.  . lisinopril-hydrochlorothiazide (ZESTORETIC) 10-12.5 MG tablet Take 1 tablet by mouth daily.  [name] (RHOPRESSA) 0.02 % SOLN Place 1 drop into both eyes at bedtime.  Marland Kitchen omeprazole (PRILOSEC) 40 MG capsule Take 40 mg by mouth daily.  . prednisoLONE acetate (PRED FORTE) 1 % ophthalmic suspension Place 1 drop into the left eye 2 (two) times daily.  . vitamin B-12 (CYANOCOBALAMIN) 1000 MCG tablet Take 1,000 mcg by mouth daily.   No Known Allergies Past Medical History:  Diagnosis Date  . Glaucoma   . Hypertension   . Psoriasis    Family History  Problem Relation Age of Onset  . High blood pressure Mother   . Stroke Mother   . High blood pressure Sister    Past Surgical History:  Procedure Laterality Date  . GLAUCOMA SURGERY Left   . HERNIA REPAIR    . HYDROCELE EXCISION / REPAIR     Social History   Socioeconomic History  . Marital status: Married    Spouse name: Not on file  . Number of children: Not on file  . Years of education: Not on file  . Highest education level: Not on file  Occupational History  . Not on file  Tobacco Use  . Smoking status: Former Smoker    Types: Pipe  . Smokeless tobacco: Never Used  . Tobacco comment: quit >40 years ago  Substance and Sexual Activity  . Alcohol use: Yes    Alcohol/week: 7.0 standard drinks    Types: 7 Standard drinks or equivalent per week    Comment: 1 drink daily  . Drug use: Never  . Sexual activity: Not on file  Other Topics Concern  . Not on file  Social History Narrative    Wife has alzheimer's   Social Determinants of Health   Financial Resource Strain:   . Difficulty of Paying Living Expenses: Not on file  Food Insecurity:   . Worried About Charity fundraiser in the Last Year: Not on file  . Ran Out of Food in the Last Year: Not on file  Transportation Needs:   . Lack of Transportation (Medical): Not on file  . Lack of Transportation (Non-Medical): Not on file  Physical Activity:   . Days of Exercise per Week: Not on file  . Minutes of Exercise per Session: Not on file  Stress:   . Feeling of Stress : Not on file  Social Connections:   . Frequency of Communication with Friends and Family: Not on file  . Frequency of Social Gatherings with Friends and Family: Not on file  . Attends Religious Services: Not on file  . Active Member of Clubs or Organizations: Not on file  .  Attends Archivist Meetings: Not on file  . Marital Status: Not on file  Intimate Partner Violence:   . Fear of Current or Ex-Partner: Not on file  . Emotionally Abused: Not on file  . Physically Abused: Not on file  . Sexually Abused: Not on file     Lipid Panel     Component Value Date/Time   CHOL 160 02/12/2019 1024   TRIG 68 02/12/2019 1024   HDL 48 02/12/2019 1024   CHOLHDL 3.3 02/12/2019 1024   CHOLHDL 4.2 10/10/2018 0426   VLDL 13 10/10/2018 0426   LDLCALC 99 02/12/2019 1024   Review of Systems: General: negative for chills, fever, night sweats or weight changes.  Cardiovascular: negative for chest pain, dyspnea on exertion, edema, orthopnea, palpitations, paroxysmal nocturnal dyspnea or shortness of breath Dermatological: negative for rash Respiratory: negative for cough or wheezing Urologic: negative for hematuria Abdominal: negative for nausea, vomiting, diarrhea, bright red blood per rectum, melena, or hematemesis Neurologic: negative for visual changes, syncope, or dizziness All other systems reviewed and are otherwise negative except as noted  above.  Physical Exam:  Blood pressure (!) 146/76, pulse 72, height 6\' 1"  (1.854 m), weight 177 lb 6.4 oz (80.5 kg), SpO2 96 %.  General appearance: alert, cooperative and no distress Neck: no carotid bruit and no JVD Lungs: clear to auscultation bilaterally Heart: regular rate and rhythm, S1, S2 normal, no murmur, click, rub or gallop Extremities: 1+ bilateral ankle edema Pulses: 2+ and symmetric Skin: Skin color, texture, turgor normal. No rashes or lesions Neurologic: Grossly normal  EKG NSR, 66 bpm, LVH with repol abnormalities, this is unchanged from prior-- personally reviewed     ASSESSMENT AND PLAN:   1. Atrial Fibrillation: he converted w/ amiodarone. Maintaining NSR, now off amiodarone since October.  No palpitations.  TSH normal in November 2020.  Heart rate has improved now 66, he has more energy.  We will continue Eliquis, he has no bleeding and his hemoglobin was 13.    2. Chest Pain: no further recurrence. Likely 2/2 rapid afib, which has resolved. NST showed no ischemia.   3. LEE: LVEF normal by recent echo. Diastolic function could not be adequately assessed at that time due to afib.  He is again advised to use compression stockings and ankle pumps, lower extremity edema is minimal and does not bother him.  4. HTN: Blood pressure diary shows blood pressure in the range 1 15-1 45, however previously orthostatic with lower blood pressures I will continue same management with lisinopril-hctz.  Electrolytes and creatinine is stable.  Follow-up in 6 months.  [name] , MD [facility] 06/12/2019 10:33 AM

## 2019-08-15 DIAGNOSIS — I4891 Unspecified atrial fibrillation: Secondary | ICD-10-CM | POA: Diagnosis not present

## 2019-08-15 DIAGNOSIS — R5383 Other fatigue: Secondary | ICD-10-CM | POA: Diagnosis not present

## 2019-08-15 DIAGNOSIS — N400 Enlarged prostate without lower urinary tract symptoms: Secondary | ICD-10-CM | POA: Diagnosis not present

## 2019-08-15 DIAGNOSIS — E039 Hypothyroidism, unspecified: Secondary | ICD-10-CM | POA: Diagnosis not present

## 2019-08-15 DIAGNOSIS — E538 Deficiency of other specified B group vitamins: Secondary | ICD-10-CM | POA: Diagnosis not present

## 2019-08-15 DIAGNOSIS — I10 Essential (primary) hypertension: Secondary | ICD-10-CM | POA: Diagnosis not present

## 2019-09-22 DIAGNOSIS — H401113 Primary open-angle glaucoma, right eye, severe stage: Secondary | ICD-10-CM | POA: Diagnosis not present

## 2019-09-22 DIAGNOSIS — H401122 Primary open-angle glaucoma, left eye, moderate stage: Secondary | ICD-10-CM | POA: Diagnosis not present

## 2019-09-22 DIAGNOSIS — Z9889 Other specified postprocedural states: Secondary | ICD-10-CM | POA: Diagnosis not present

## 2019-09-22 DIAGNOSIS — Z961 Presence of intraocular lens: Secondary | ICD-10-CM | POA: Diagnosis not present

## 2019-11-05 ENCOUNTER — Other Ambulatory Visit: Payer: Self-pay | Admitting: Medical

## 2019-11-05 NOTE — Telephone Encounter (Signed)
Prescription refill request for Eliquis received.  Last office visit: 06/12/2019, Meda Coffee Scr:  1.26, 02/12/2019 Age:  86 y.o. Weight: 80.5 kg   Prescription refill sent.

## 2019-11-21 DIAGNOSIS — R351 Nocturia: Secondary | ICD-10-CM | POA: Diagnosis not present

## 2019-11-21 DIAGNOSIS — R350 Frequency of micturition: Secondary | ICD-10-CM | POA: Diagnosis not present

## 2019-11-21 DIAGNOSIS — N401 Enlarged prostate with lower urinary tract symptoms: Secondary | ICD-10-CM | POA: Diagnosis not present

## 2019-12-02 ENCOUNTER — Other Ambulatory Visit: Payer: Self-pay | Admitting: Medical

## 2020-01-06 DIAGNOSIS — N401 Enlarged prostate with lower urinary tract symptoms: Secondary | ICD-10-CM | POA: Diagnosis not present

## 2020-01-06 DIAGNOSIS — R350 Frequency of micturition: Secondary | ICD-10-CM | POA: Diagnosis not present

## 2020-01-06 DIAGNOSIS — R351 Nocturia: Secondary | ICD-10-CM | POA: Diagnosis not present

## 2020-01-17 IMAGING — CR CHEST - 2 VIEW
2 series · 3 of 3 positions shown · non-contrast
Comparison: None.

CLINICAL DATA: Chest pain beginning last night.  Tachycardia.

EXAM:
CHEST - 2 VIEW

[Series 2: chest lat · 0.14mm/px · 2 of 2 slices shown]
[im 1/2]
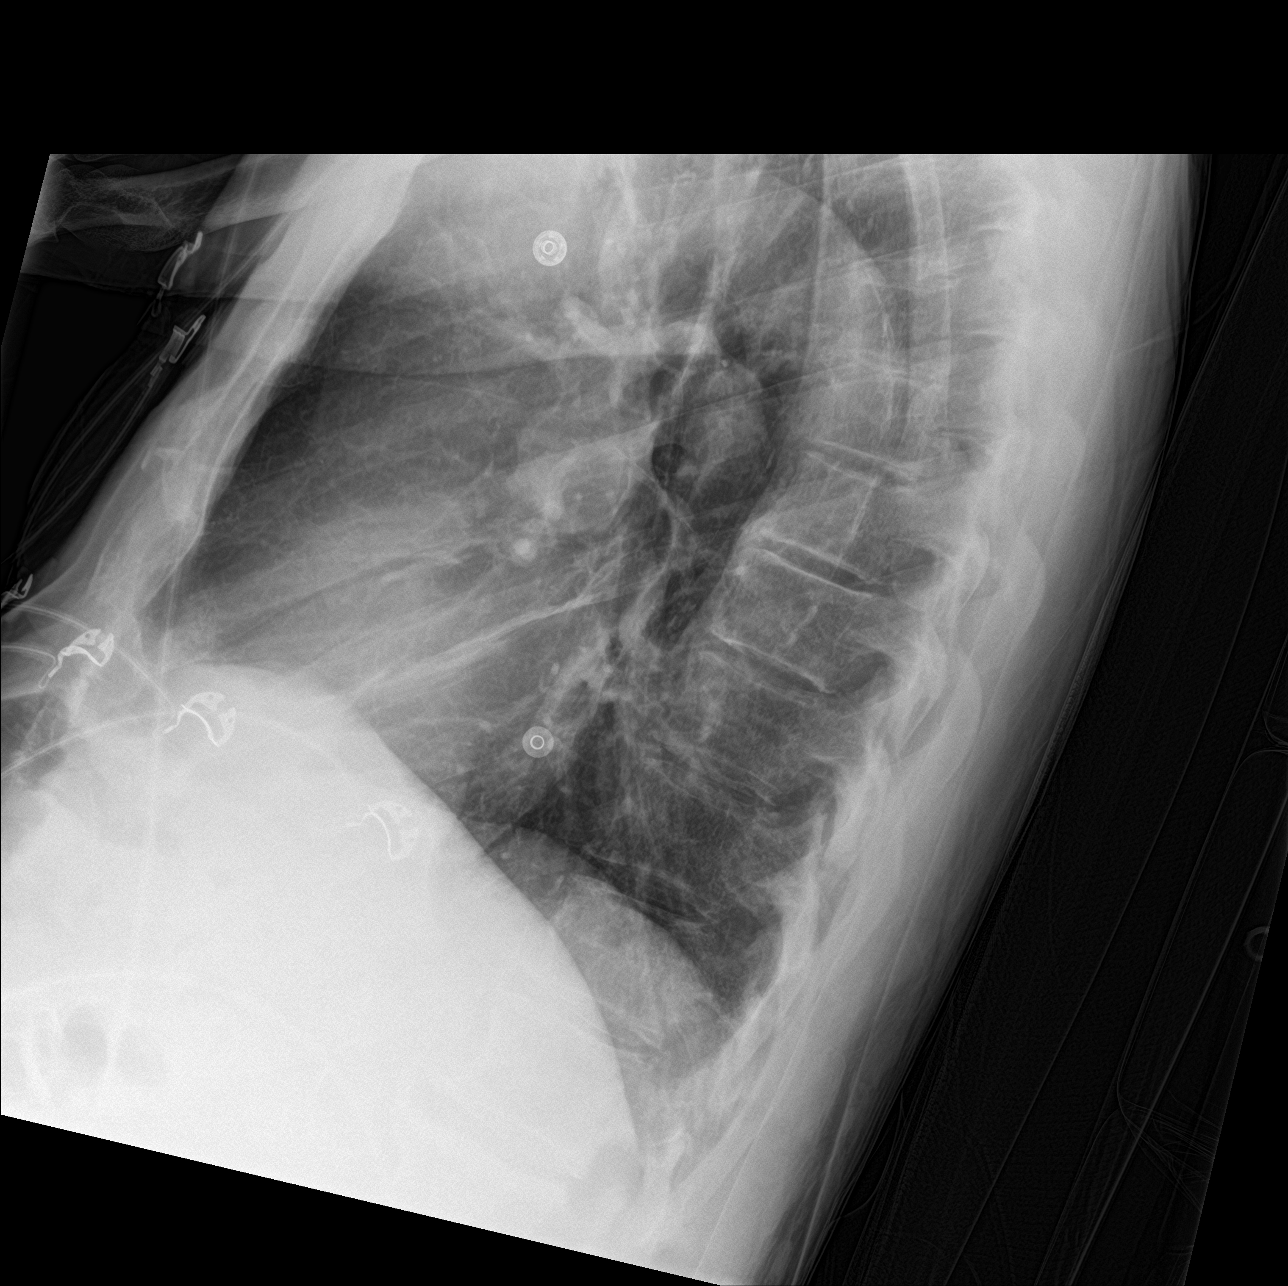
[im 2/2]
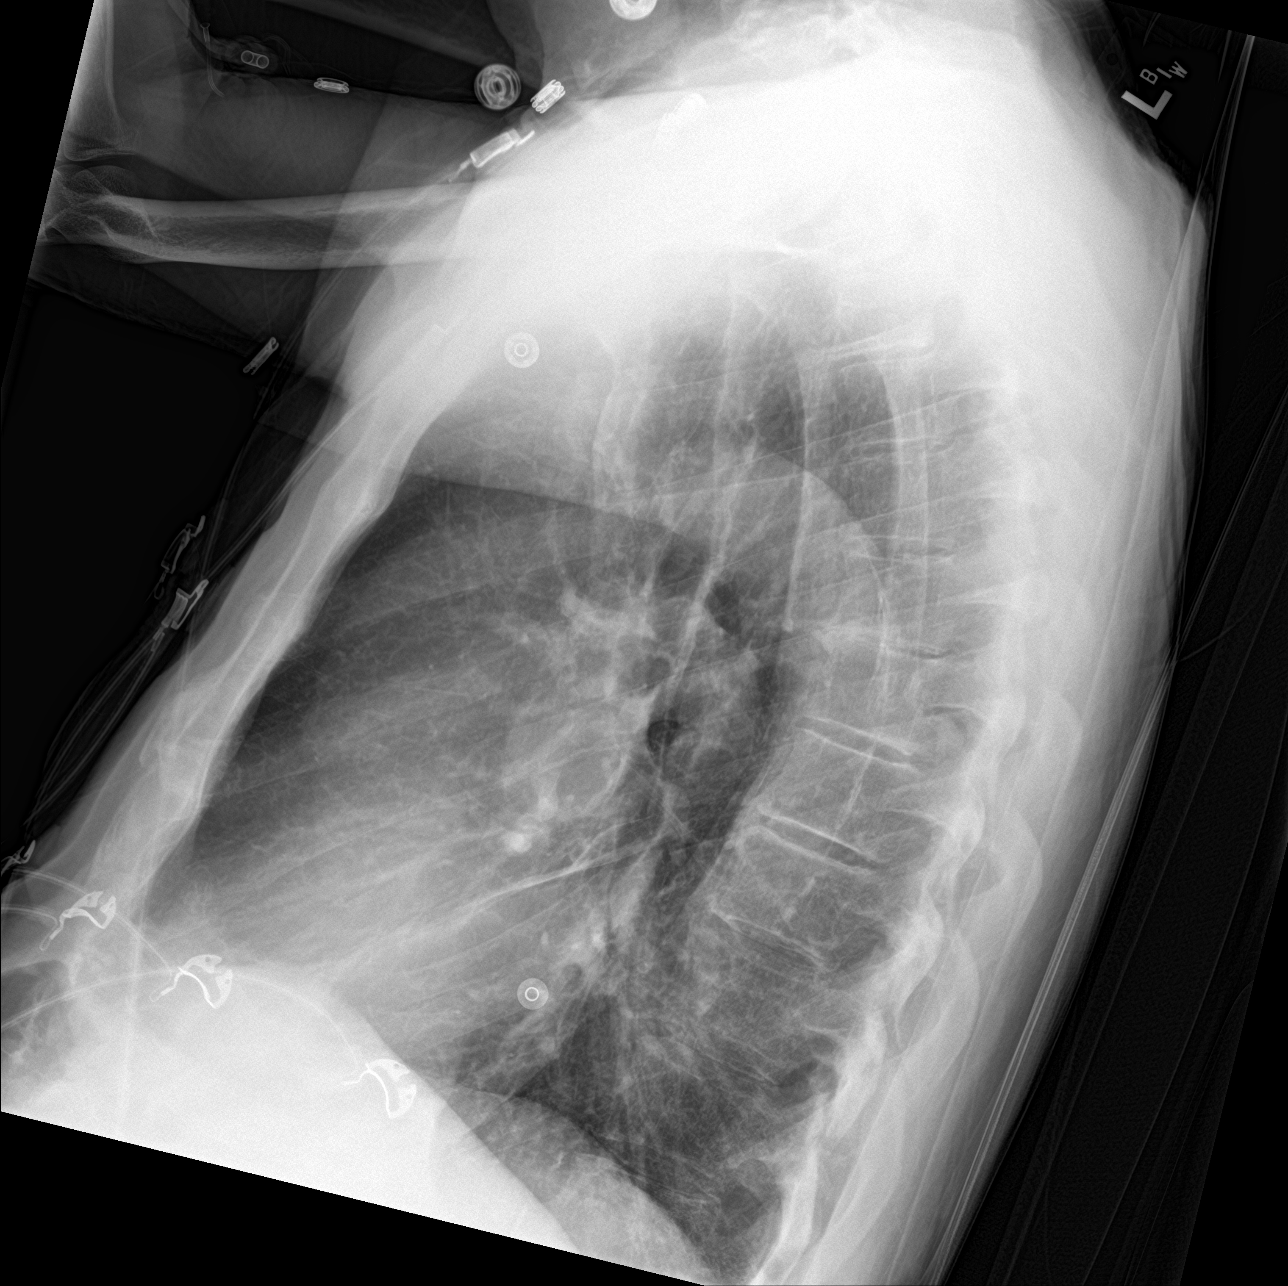

[chest ap]
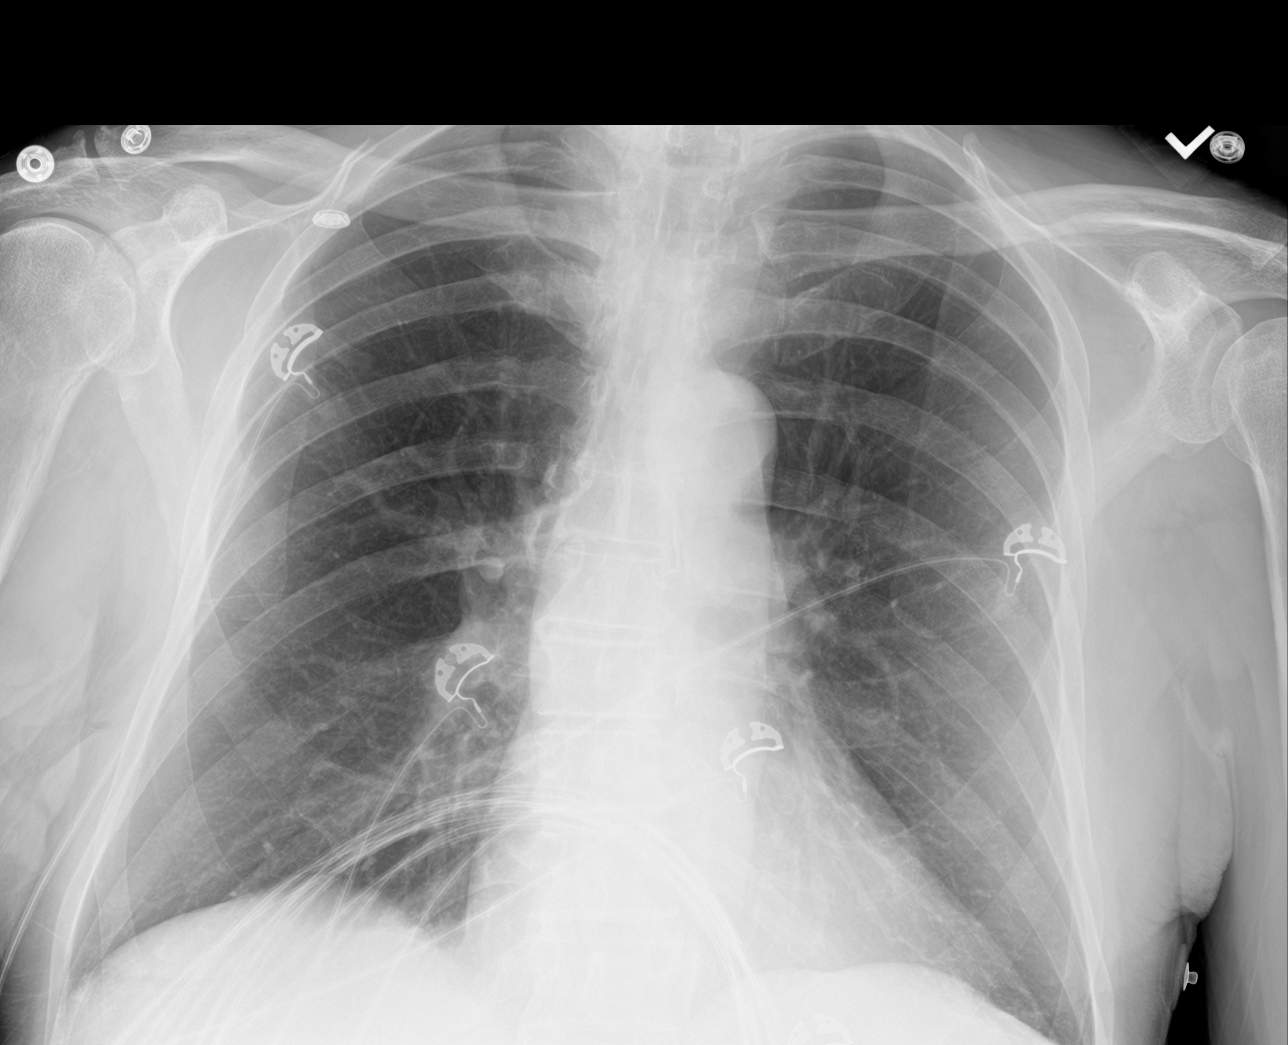

[3 of 3 positions shown; findings below may reference images not displayed]

FINDINGS: Heart size is normal. Mediastinal shadows are normal. The lungs are
clear except for mild atelectasis or scar in the right middle lobe
or lingula. Pulmonary vascularity is normal. No effusions. No acute
bone finding.
IMPRESSION: Probably no active disease. Minimal linear scarring or atelectasis
in the right middle lobe or lingula.

## 2020-01-20 DIAGNOSIS — H401122 Primary open-angle glaucoma, left eye, moderate stage: Secondary | ICD-10-CM | POA: Diagnosis not present

## 2020-01-20 DIAGNOSIS — H401113 Primary open-angle glaucoma, right eye, severe stage: Secondary | ICD-10-CM | POA: Diagnosis not present

## 2020-01-20 DIAGNOSIS — Z961 Presence of intraocular lens: Secondary | ICD-10-CM | POA: Diagnosis not present

## 2020-01-20 DIAGNOSIS — Z9889 Other specified postprocedural states: Secondary | ICD-10-CM | POA: Diagnosis not present

## 2020-02-18 ENCOUNTER — Other Ambulatory Visit: Payer: Self-pay

## 2020-02-18 ENCOUNTER — Encounter: Payer: Self-pay | Admitting: Cardiology

## 2020-02-18 ENCOUNTER — Ambulatory Visit (INDEPENDENT_AMBULATORY_CARE_PROVIDER_SITE_OTHER): Payer: Medicare Other | Admitting: Cardiology

## 2020-02-18 VITALS — BP 136/82 | HR 74 | Ht 73.0 in | Wt 174.0 lb

## 2020-02-18 DIAGNOSIS — I480 Paroxysmal atrial fibrillation: Secondary | ICD-10-CM

## 2020-02-18 NOTE — Patient Instructions (Signed)

## 2020-02-18 NOTE — Progress Notes (Signed)
02/18/2020 [name]   1929/05/25  [id number]  Primary Physician Antony Contras, MD Primary Cardiologist: Ena Dawley, MD  Electrophysiologist: None   Reason for Visit/CC: 6 months follow-up  HPI:  [name] is a 86 y.o. male with a PMH significant for HTN, psoriasis (on enbrel), hypothyroidism, and glaucoma, new onset atrial fibrillation in July 2020, CHA2DS2-VASc Score of 3 (HTN, Age >75) on apixaban 5mg  BID. Echo showed normal LVEF, 55-60% and no significant valvular disease. He had his outpatient NST done on 7/17. This was a low risk study and showed no ischemia.  She needs coming back after 6 months he remains active, walks 30 minutes every day, he has noticed some instability to his balance however he feels that that is improved with exercise.  No falls, no presyncope or syncope.  No palpitations recently.  No lower extremity edema orthopnea proximal nocturnal dyspnea.  He has been compliant with his meds reports no side effects and has not had any bleeding with Eliquis.  Cardiac Studies   Echocardiogram 10/10/2018: 1. The left ventricle has normal systolic function, with an ejection fraction of 55-60%. The cavity size was normal. There is moderate concentric left ventricular hypertrophy. Left ventricular diastolic function could not be evaluated secondary to  atrial fibrillation. No evidence of left ventricular regional wall motion abnormalities. 2. The right ventricle has normal systolic function. The cavity was mildly enlarged. There is no increase in right ventricular wall thickness. Right ventricular systolic pressure is normal. 3. Right atrial size was moderately dilated. 4. The aortic valve is tricuspid. Mild thickening of the aortic valve. No stenosis of the aortic valve. 5. There is mild to moderate dilatation of the ascending aorta measuring 45 mm.  NST 10/25/18  Study Highlights    The left ventricular ejection fraction is hyperdynamic  (>65%).  Nuclear stress EF: 68%. No wall motion abnormalities.  There was no ST segment deviation noted during stress.  This is a low risk study. No ischemia.   Candee Furbish, MD    Current Meds  Medication Sig  . bimatoprost (LUMIGAN) 0.01 % SOLN Place 1 drop into both eyes at bedtime.  . brimonidine-timolol (COMBIGAN) 0.2-0.5 % ophthalmic solution Place 1 drop into both eyes 2 (two) times a day.  . dorzolamide (TRUSOPT) 2 % ophthalmic solution Place 1 drop into the right eye 2 (two) times daily.  Marland Kitchen ELIQUIS 5 MG TABS tablet TAKE 1 TABLET BY MOUTH TWICE A DAY  . ENBREL 50 MG/ML injection Inject 1 mL into the skin once a week.  . levothyroxine (SYNTHROID) 75 MCG tablet Take 75 mcg by mouth daily before breakfast.  . lisinopril-hydrochlorothiazide (ZESTORETIC) 10-12.5 MG tablet TAKE 1 TABLET BY MOUTH EVERY DAY  . Netarsudil Dimesylate (RHOPRESSA) 0.02 % SOLN Place 1 drop into both eyes at bedtime.  Marland Kitchen omeprazole (PRILOSEC) 40 MG capsule Take 40 mg by mouth daily.  . prednisoLONE acetate (PRED FORTE) 1 % ophthalmic suspension Place 1 drop into the left eye 2 (two) times daily.  . vitamin B-12 (CYANOCOBALAMIN) 1000 MCG tablet Take 1,000 mcg by mouth daily.   No Known Allergies Past Medical History:  Diagnosis Date  . Glaucoma   . Hypertension   . Psoriasis    Family History  Problem Relation Age of Onset  . High blood pressure Mother   . Stroke Mother   . High blood pressure Sister    Past Surgical History:  Procedure Laterality Date  . GLAUCOMA SURGERY Left   .  HERNIA REPAIR    . HYDROCELE EXCISION / REPAIR     Social History   Socioeconomic History  . Marital status: Married    Spouse name: Not on file  . Number of children: Not on file  . Years of education: Not on file  . Highest education level: Not on file  Occupational History  . Not on file  Tobacco Use  . Smoking status: Former Smoker    Types: Pipe  . Smokeless tobacco: Never Used  . Tobacco comment: quit  >40 years ago  Substance and Sexual Activity  . Alcohol use: Yes    Alcohol/week: 7.0 standard drinks    Types: 7 Standard drinks or equivalent per week    Comment: 1 drink daily  . Drug use: Never  . Sexual activity: Not on file  Other Topics Concern  . Not on file  Social History Narrative   Wife has alzheimer's   Social Determinants of Health   Financial Resource Strain:   . Difficulty of Paying Living Expenses: Not on file  Food Insecurity:   . Worried About Charity fundraiser in the Last Year: Not on file  . Ran Out of Food in the Last Year: Not on file  Transportation Needs:   . Lack of Transportation (Medical): Not on file  . Lack of Transportation (Non-Medical): Not on file  Physical Activity:   . Days of Exercise per Week: Not on file  . Minutes of Exercise per Session: Not on file  Stress:   . Feeling of Stress : Not on file  Social Connections:   . Frequency of Communication with Friends and Family: Not on file  . Frequency of Social Gatherings with Friends and Family: Not on file  . Attends Religious Services: Not on file  . Active Member of Clubs or Organizations: Not on file  . Attends Archivist Meetings: Not on file  . Marital Status: Not on file  Intimate Partner Violence:   . Fear of Current or Ex-Partner: Not on file  . Emotionally Abused: Not on file  . Physically Abused: Not on file  . Sexually Abused: Not on file     Lipid Panel     Component Value Date/Time   CHOL 160 02/12/2019 1024   TRIG 68 02/12/2019 1024   HDL 48 02/12/2019 1024   CHOLHDL 3.3 02/12/2019 1024   CHOLHDL 4.2 10/10/2018 0426   VLDL 13 10/10/2018 0426   LDLCALC 99 02/12/2019 1024   Review of Systems: General: negative for chills, fever, night sweats or weight changes.  Cardiovascular: negative for chest pain, dyspnea on exertion, edema, orthopnea, palpitations, paroxysmal nocturnal dyspnea or shortness of breath Dermatological: negative for rash Respiratory:  negative for cough or wheezing Urologic: negative for hematuria Abdominal: negative for nausea, vomiting, diarrhea, bright red blood per rectum, melena, or hematemesis Neurologic: negative for visual changes, syncope, or dizziness All other systems reviewed and are otherwise negative except as noted above.  Physical Exam:  Blood pressure 136/82, pulse 74, height 6\' 1"  (1.854 m), weight 174 lb (78.9 kg), SpO2 98 %.  General appearance: alert, cooperative and no distress Neck: no carotid bruit and no JVD Lungs: clear to auscultation bilaterally Heart: regular rate and rhythm, S1, S2 normal, no murmur, click, rub or gallop Extremities: 1+ bilateral ankle edema Pulses: 2+ and symmetric Skin: Skin color, texture, turgor normal. No rashes or lesions Neurologic: Grossly normal  EKG NSR, 66 bpm, LVH with repol abnormalities, this is unchanged  from prior-- personally reviewed     ASSESSMENT AND PLAN:   Atrial Fibrillation: he converted w/ amiodarone. Maintaining NSR, now off amiodarone since October 2020.  No palpitations.  TSH normal.  In sinus rhythm today, we will continue Eliquis, he has not had any bleeding or falls and has stable hemoglobin.  LE EDEMA : LVEF normal, Diastolic function could not be adequately assessed at that time due to afib.  He is again advised to use compression stockings and ankle pumps, lower extremity edema is minimal and does not bother him.  He is doing very well with that walks around, also hydrochlorothiazide helped.  HTN: Well-controlled, and no episodes of orthostatic hypotension.  Follow-up in 6 months.  Ena Dawley , MD [facility] 02/18/2020 11:44 AM

## 2020-03-09 DIAGNOSIS — M109 Gout, unspecified: Secondary | ICD-10-CM | POA: Diagnosis not present

## 2020-03-09 DIAGNOSIS — K219 Gastro-esophageal reflux disease without esophagitis: Secondary | ICD-10-CM | POA: Diagnosis not present

## 2020-03-09 DIAGNOSIS — E782 Mixed hyperlipidemia: Secondary | ICD-10-CM | POA: Diagnosis not present

## 2020-03-09 DIAGNOSIS — R7303 Prediabetes: Secondary | ICD-10-CM | POA: Diagnosis not present

## 2020-03-09 DIAGNOSIS — Z0000 Encounter for general adult medical examination without abnormal findings: Secondary | ICD-10-CM | POA: Diagnosis not present

## 2020-03-09 DIAGNOSIS — I10 Essential (primary) hypertension: Secondary | ICD-10-CM | POA: Diagnosis not present

## 2020-03-09 DIAGNOSIS — D6869 Other thrombophilia: Secondary | ICD-10-CM | POA: Diagnosis not present

## 2020-03-09 DIAGNOSIS — E039 Hypothyroidism, unspecified: Secondary | ICD-10-CM | POA: Diagnosis not present

## 2020-03-09 DIAGNOSIS — E538 Deficiency of other specified B group vitamins: Secondary | ICD-10-CM | POA: Diagnosis not present

## 2020-03-09 DIAGNOSIS — R609 Edema, unspecified: Secondary | ICD-10-CM | POA: Diagnosis not present

## 2020-03-09 DIAGNOSIS — L409 Psoriasis, unspecified: Secondary | ICD-10-CM | POA: Diagnosis not present

## 2020-03-09 DIAGNOSIS — Z23 Encounter for immunization: Secondary | ICD-10-CM | POA: Diagnosis not present

## 2020-04-14 DIAGNOSIS — L570 Actinic keratosis: Secondary | ICD-10-CM | POA: Diagnosis not present

## 2020-04-14 DIAGNOSIS — L819 Disorder of pigmentation, unspecified: Secondary | ICD-10-CM | POA: Diagnosis not present

## 2020-04-14 DIAGNOSIS — L853 Xerosis cutis: Secondary | ICD-10-CM | POA: Diagnosis not present

## 2020-04-28 ENCOUNTER — Other Ambulatory Visit: Payer: Self-pay | Admitting: Medical

## 2020-04-28 NOTE — Telephone Encounter (Signed)
Prescription refill request for Eliquis received.  Indication: Afib Last office visit: 02/18/2020 Scr: 1.06, 03/09/2020 Age: 87 yo  Weight: 78.9 kg   Prescription refill sent.

## 2020-05-19 DIAGNOSIS — Z961 Presence of intraocular lens: Secondary | ICD-10-CM | POA: Diagnosis not present

## 2020-05-19 DIAGNOSIS — H401113 Primary open-angle glaucoma, right eye, severe stage: Secondary | ICD-10-CM | POA: Diagnosis not present

## 2020-05-19 DIAGNOSIS — Z9889 Other specified postprocedural states: Secondary | ICD-10-CM | POA: Diagnosis not present

## 2020-05-19 DIAGNOSIS — H401122 Primary open-angle glaucoma, left eye, moderate stage: Secondary | ICD-10-CM | POA: Diagnosis not present

## 2020-05-24 DIAGNOSIS — C44729 Squamous cell carcinoma of skin of left lower limb, including hip: Secondary | ICD-10-CM | POA: Diagnosis not present

## 2020-05-24 DIAGNOSIS — D485 Neoplasm of uncertain behavior of skin: Secondary | ICD-10-CM | POA: Diagnosis not present

## 2020-05-31 ENCOUNTER — Other Ambulatory Visit: Payer: Self-pay | Admitting: Cardiology

## 2020-07-13 DIAGNOSIS — C44729 Squamous cell carcinoma of skin of left lower limb, including hip: Secondary | ICD-10-CM | POA: Diagnosis not present

## 2020-07-19 DIAGNOSIS — Z23 Encounter for immunization: Secondary | ICD-10-CM | POA: Diagnosis not present

## 2020-07-26 DIAGNOSIS — C44729 Squamous cell carcinoma of skin of left lower limb, including hip: Secondary | ICD-10-CM | POA: Diagnosis not present

## 2020-08-09 DIAGNOSIS — D485 Neoplasm of uncertain behavior of skin: Secondary | ICD-10-CM | POA: Diagnosis not present

## 2020-08-09 DIAGNOSIS — Z08 Encounter for follow-up examination after completed treatment for malignant neoplasm: Secondary | ICD-10-CM | POA: Diagnosis not present

## 2020-08-29 NOTE — Progress Notes (Signed)
Cardiology Office Note:    Date:  08/31/2020   ID:  [name], DOB 1930/01/19, MRN [id number]  PCP:  Antony Contras, MD   [facility] Providers Cardiologist:  Ena Dawley, MD (Inactive)     Referring MD: Antony Contras, MD    History of Present Illness:    ALEXANDRIA SANOCKI is a 87 y.o. male with a hx of HTN, psoriasis (on enbrel), hypothyroidism, and glaucoma, and paroxysmal atrial fibrillation who was previously followed by Dr. Meda Coffee who now returns to clinic for follow-up of his Afib and HTN.  Per review of the record, patient was diagnosed with Afib in 10/2018. Echo showed normal LVEF, 55-60% and no significant valvular disease. He had his outpatient NST done on 7/17. This was a low risk study and showed no ischemia.   During last visit on 02/18/20 where he was doing well and remained active Tolerating medications without issues.  Today, the patient feels very tired frequently throughout the day. He has recently moved to a retirement community and has not been as active as he is used to being. He did, however, start going to exercise classes which has helped his fatigue. He also admits that he is not drinking much fluid and has a tendency to get dehydrated, which may be contributing to his fatigue. Otherwise, no chest pressure, SOB, bleeding issues, orthopnea or PND. TSH is well controlled.  Past Medical History:  Diagnosis Date  . Glaucoma   . Hypertension   . Psoriasis     Past Surgical History:  Procedure Laterality Date  . GLAUCOMA SURGERY Left   . HERNIA REPAIR    . HYDROCELE EXCISION / REPAIR      Current Medications: Current Meds  Medication Sig  . bimatoprost (LUMIGAN) 0.01 % SOLN Place 1 drop into the right eye at bedtime.  . brimonidine-timolol (COMBIGAN) 0.2-0.5 % ophthalmic solution Place 1 drop into the right eye 2 (two) times a day.  . cholecalciferol (VITAMIN D3) 25 MCG (1000 UNIT) tablet Take 1,000 Units by mouth daily.  . dorzolamide (TRUSOPT)  2 % ophthalmic solution Place 1 drop into the right eye 2 (two) times daily.  Marland Kitchen ELIQUIS 5 MG TABS tablet TAKE 1 TABLET BY MOUTH TWICE A DAY  . ENBREL 50 MG/ML injection Inject 1 mL into the skin once a week.  . levothyroxine (SYNTHROID) 75 MCG tablet Take 75 mcg by mouth daily before breakfast.  . lisinopril (ZESTRIL) 20 MG tablet Take 1 tablet (20 mg total) by mouth daily.  . Multiple Vitamin (MULTIVITAMIN ADULT PO) Take by mouth.  Mckinley Jewel Dimesylate (RHOPRESSA) 0.02 % SOLN Place 1 drop into the right eye at bedtime.  Marland Kitchen omeprazole (PRILOSEC) 40 MG capsule Take 40 mg by mouth daily.  . prednisoLONE acetate (PRED FORTE) 1 % ophthalmic suspension Place 1 drop into the left eye 2 (two) times daily.  . vitamin B-12 (CYANOCOBALAMIN) 1000 MCG tablet Take 1,000 mcg by mouth daily.  . [DISCONTINUED] lisinopril-hydrochlorothiazide (ZESTORETIC) 10-12.5 MG tablet TAKE 1 TABLET BY MOUTH EVERY DAY     Allergies:   Patient has no known allergies.   Social History   Socioeconomic History  . Marital status: Married    Spouse name: Not on file  . Number of children: Not on file  . Years of education: Not on file  . Highest education level: Not on file  Occupational History  . Not on file  Tobacco Use  . Smoking status: Former Smoker    Types:  Pipe  . Smokeless tobacco: Never Used  . Tobacco comment: quit >40 years ago  Vaping Use  . Vaping Use: Never used  Substance and Sexual Activity  . Alcohol use: Yes    Alcohol/week: 7.0 standard drinks    Types: 7 Standard drinks or equivalent per week    Comment: 1 drink daily  . Drug use: Never  . Sexual activity: Not on file  Other Topics Concern  . Not on file  Social History Narrative   Wife has alzheimer's   Social Determinants of Health   Financial Resource Strain: Not on file  Food Insecurity: Not on file  Transportation Needs: Not on file  Physical Activity: Not on file  Stress: Not on file  Social Connections: Not on file      Family History: The patient's family history includes High blood pressure in his mother and sister; Stroke in his mother.  ROS:   Please see the history of present illness.    Review of Systems  Constitutional: Positive for malaise/fatigue. Negative for chills and fever.  HENT: Positive for hearing loss.   Eyes: Negative for pain.  Respiratory: Negative for shortness of breath.   Cardiovascular: Positive for leg swelling. Negative for chest pain, palpitations, orthopnea, claudication and PND.  Gastrointestinal: Negative for blood in stool, melena, nausea and vomiting.  Genitourinary: Negative for hematuria.  Musculoskeletal: Positive for joint pain. Negative for falls.  Neurological: Negative for dizziness and loss of consciousness.  Endo/Heme/Allergies: Positive for environmental allergies.  Psychiatric/Behavioral: Negative for substance abuse.    EKGs/Labs/Other Studies Reviewed:    The following studies were reviewed today: Echocardiogram 10/10/2018: 1. The left ventricle has normal systolic function, with an ejection fraction of 55-60%. The cavity size was normal. There is moderate concentric left ventricular hypertrophy. Left ventricular diastolic function could not be evaluated secondary to  atrial fibrillation. No evidence of left ventricular regional wall motion abnormalities. 2. The right ventricle has normal systolic function. The cavity was mildly enlarged. There is no increase in right ventricular wall thickness. Right ventricular systolic pressure is normal. 3. Right atrial size was moderately dilated. 4. The aortic valve is tricuspid. Mild thickening of the aortic valve. No stenosis of the aortic valve. 5. There is mild to moderate dilatation of the ascending aorta measuring 45 mm.  NST 10/25/18  Study Highlights    The left ventricular ejection fraction is hyperdynamic (>65%).  Nuclear stress EF: 68%. No wall motion abnormalities.  There was no ST segment  deviation noted during stress.  This is a low risk study. No ischemia.      EKG:  No new tracing today  Recent Labs: No results found for requested labs within last 8760 hours.  Recent Lipid Panel    Component Value Date/Time   CHOL 160 02/12/2019 1024   TRIG 68 02/12/2019 1024   HDL 48 02/12/2019 1024   CHOLHDL 3.3 02/12/2019 1024   CHOLHDL 4.2 10/10/2018 0426   VLDL 13 10/10/2018 0426   LDLCALC 99 02/12/2019 1024     Risk Assessment/Calculations:    CHA2DS2-VASc Score = 3  {This indicates a 3.2% annual risk of stroke. The patient's score is based upon: CHF History: No HTN History: Yes Diabetes History: No Stroke History: No Vascular Disease History: No Age Score: 2 Gender Score: 0     Physical Exam:    VS:  BP 140/80   Pulse 64   Ht 6\' 1"  (1.854 m)   Wt 171 lb 3.2 oz (77.7  kg)   SpO2 99%   BMI 22.59 kg/m     Wt Readings from Last 3 Encounters:  08/31/20 171 lb 3.2 oz (77.7 kg)  02/18/20 174 lb (78.9 kg)  06/12/19 177 lb 6.4 oz (80.5 kg)     GEN: Elderly male, NAD HEENT: Normal NECK: No JVD; No carotid bruits CARDIAC: RRR, no murmurs, rubs, gallops RESPIRATORY:  Clear to auscultation without rales, wheezing or rhonchi  ABDOMEN: Soft, non-tender, non-distended MUSCULOSKELETAL:  No edema; No deformity  SKIN: Warm and dry NEUROLOGIC:  Alert and oriented x 3 PSYCHIATRIC:  Normal affect   ASSESSMENT:    1. Paroxysmal atrial fibrillation (HCC)   2. Lower extremity edema   3. Hyperlipidemia, unspecified hyperlipidemia type   4. Fatigue, unspecified type    PLAN:    In order of problems listed above:  #Paroxysmal Afib: CHADs-vasc 3. On AC with apixaban BID without bleeding issues. Not requiring nodal agents. -Continue apixaban 5mg  BID -Not on nodal agents  #LE Edema: Chronic and very minimal on exam. TTE with normal LVEF, no significant valve disease. Diastolic function indeterminant due to afib. -Continue compression socks and ankle  pumps -Change lisinopril-HCTZ to lisinopril 20mg  daily and monitor off diuretics as I am worried he is getting dehydrated  #HTN: -Change lisinopril-HCTZ to lisinopril 20mg  daily as I am afraid he is getting dehydrated -BMET with PCP next week -Monitor blood pressures 2x/day for [place] to review  #Fatigue: Likely multifactorial due to possible dehydration, seasonal allergies and change in activity level.  -Okay to take zyrtec of allergies -Will hold HCTZ for now and monitor off diuretics -Increase activity and encouraged him to go to exercise classes       Medication Adjustments/Labs and Tests Ordered: Current medicines are reviewed at length with the patient today.  Concerns regarding medicines are outlined above.  No orders of the defined types were placed in this encounter.  Meds ordered this encounter  Medications  . lisinopril (ZESTRIL) 20 MG tablet    Sig: Take 1 tablet (20 mg total) by mouth daily.    Dispense:  90 tablet    Refill:  1    Patient Instructions  Medication Instructions:   STOP TAKING COMBO MED LISINOPRIL-HCTZ NOW  START TAKING REGULAR LISINOPRIL 20 MG BY MOUTH DAILY  DR. Johney Frame SAID YOU CAN TAKE OVER-THE-COUNTER ZYRTEC AS NEEDED FOR YOUR ALLERGIES--TAKE AS DIRECTED ON BOTTLE  *If you need a refill on your cardiac medications before your next appointment, please call your pharmacy*   Lab Work:  ON 09/09/20 WHEN YOU GO TO SEE YOUR PRIMARY CARE PROVIDER, PLEASE HAVE THEM DRAW A BMET ON YOU AND FAX THOSE RESULTS TO DR. Johney Frame AT [phone] ATTN: DR. Johney Frame  If you have labs (blood work) drawn today and your tests are completely normal, you will receive your results only by: Marland Kitchen MyChart Message (if you have MyChart) OR . A paper copy in the mail If you have any lab test that is abnormal or we need to change your treatment, we will call you to review the results.   Follow-Up: At [facility], you and your health needs are our priority.  As part  of our continuing mission to provide you with exceptional heart care, we have created designated Provider Care Teams.  These Care Teams include your primary Cardiologist (physician) and Advanced Practice Providers (APPs -  Physician Assistants and Nurse Practitioners) who all work together to provide you with the care you need, when you need it.  We recommend signing up for the patient portal called "MyChart".  Sign up information is provided on this After Visit Summary.  MyChart is used to connect with patients for Virtual Visits (Telemedicine).  Patients are able to view lab/test results, encounter notes, upcoming appointments, etc.  Non-urgent messages can be sent to your provider as well.   To learn more about what you can do with MyChart, go to [email or web address].    Your next appointment:   6 month(s)  The format for your next appointment:   In Person  Provider:   You will see one of the following Advanced Practice Providers on your designated Care Team:    Richardson Dopp, PA-C  Vin Curtiss, PA-C  DAYNA DUNN PA-C  Cecilie Kicks NP  MICHELE LENZE PA-C  JILL MCDANIEL NP   Other Instructions  HAVE YOUR PCP DRAW A BMET ON YOU AT [place] ON 09/09/20     Signed, Freada Bergeron, MD  08/31/2020 1:17 PM    [place]

## 2020-08-31 ENCOUNTER — Ambulatory Visit (INDEPENDENT_AMBULATORY_CARE_PROVIDER_SITE_OTHER): Payer: Medicare Other | Admitting: Cardiology

## 2020-08-31 ENCOUNTER — Encounter: Payer: Self-pay | Admitting: Cardiology

## 2020-08-31 ENCOUNTER — Other Ambulatory Visit: Payer: Self-pay

## 2020-08-31 VITALS — BP 140/80 | HR 64 | Ht 73.0 in | Wt 171.2 lb

## 2020-08-31 DIAGNOSIS — E785 Hyperlipidemia, unspecified: Secondary | ICD-10-CM

## 2020-08-31 DIAGNOSIS — R600 Localized edema: Secondary | ICD-10-CM | POA: Diagnosis not present

## 2020-08-31 DIAGNOSIS — R5383 Other fatigue: Secondary | ICD-10-CM

## 2020-08-31 DIAGNOSIS — I480 Paroxysmal atrial fibrillation: Secondary | ICD-10-CM

## 2020-08-31 MED ORDER — LISINOPRIL 20 MG PO TABS: 20.0000 mg | ORAL_TABLET | Freq: Every day | ORAL | 1 refills | Status: DC

## 2020-08-31 NOTE — Patient Instructions (Signed)
Medication Instructions:   STOP TAKING COMBO MED LISINOPRIL-HCTZ NOW  START TAKING REGULAR LISINOPRIL 20 MG BY MOUTH DAILY  DR. Johney Frame SAID YOU CAN TAKE OVER-THE-COUNTER ZYRTEC AS NEEDED FOR YOUR ALLERGIES--TAKE AS DIRECTED ON BOTTLE  *If you need a refill on your cardiac medications before your next appointment, please call your pharmacy*   Lab Work:  ON 09/09/20 WHEN YOU GO TO SEE YOUR PRIMARY CARE PROVIDER, PLEASE HAVE THEM DRAW A BMET ON YOU AND FAX THOSE RESULTS TO DR. Johney Frame AT [phone] ATTN: DR. Johney Frame  If you have labs (blood work) drawn today and your tests are completely normal, you will receive your results only by: Marland Kitchen MyChart Message (if you have MyChart) OR . A paper copy in the mail If you have any lab test that is abnormal or we need to change your treatment, we will call you to review the results.   Follow-Up: At [facility], you and your health needs are our priority.  As part of our continuing mission to provide you with exceptional heart care, we have created designated Provider Care Teams.  These Care Teams include your primary Cardiologist (physician) and Advanced Practice Providers (APPs -  Physician Assistants and Nurse Practitioners) who all work together to provide you with the care you need, when you need it.  We recommend signing up for the patient portal called "MyChart".  Sign up information is provided on this After Visit Summary.  MyChart is used to connect with patients for Virtual Visits (Telemedicine).  Patients are able to view lab/test results, encounter notes, upcoming appointments, etc.  Non-urgent messages can be sent to your provider as well.   To learn more about what you can do with MyChart, go to [email or web address].    Your next appointment:   6 month(s)  The format for your next appointment:   In Person  Provider:   You will see one of the following Advanced Practice Providers on your designated Care Team:    Richardson Dopp, PA-C  Vin Bhagat, PA-C  DAYNA DUNN PA-C  LAURA INGOLD NP  MICHELE LENZE PA-C  JILL MCDANIEL NP   Other Instructions  HAVE YOUR PCP DRAW A BMET ON YOU AT [place] ON 09/09/20

## 2020-09-09 DIAGNOSIS — K219 Gastro-esophageal reflux disease without esophagitis: Secondary | ICD-10-CM | POA: Diagnosis not present

## 2020-09-09 DIAGNOSIS — E039 Hypothyroidism, unspecified: Secondary | ICD-10-CM | POA: Diagnosis not present

## 2020-09-09 DIAGNOSIS — I4891 Unspecified atrial fibrillation: Secondary | ICD-10-CM | POA: Diagnosis not present

## 2020-09-09 DIAGNOSIS — I10 Essential (primary) hypertension: Secondary | ICD-10-CM | POA: Diagnosis not present

## 2020-09-09 DIAGNOSIS — R609 Edema, unspecified: Secondary | ICD-10-CM | POA: Diagnosis not present

## 2020-09-09 DIAGNOSIS — D6869 Other thrombophilia: Secondary | ICD-10-CM | POA: Diagnosis not present

## 2020-09-09 DIAGNOSIS — M109 Gout, unspecified: Secondary | ICD-10-CM | POA: Diagnosis not present

## 2020-09-09 DIAGNOSIS — E782 Mixed hyperlipidemia: Secondary | ICD-10-CM | POA: Diagnosis not present

## 2020-09-09 DIAGNOSIS — R7303 Prediabetes: Secondary | ICD-10-CM | POA: Diagnosis not present

## 2020-09-09 DIAGNOSIS — Z23 Encounter for immunization: Secondary | ICD-10-CM | POA: Diagnosis not present

## 2020-09-09 DIAGNOSIS — E538 Deficiency of other specified B group vitamins: Secondary | ICD-10-CM | POA: Diagnosis not present

## 2020-09-14 DIAGNOSIS — D485 Neoplasm of uncertain behavior of skin: Secondary | ICD-10-CM | POA: Diagnosis not present

## 2020-09-14 DIAGNOSIS — L905 Scar conditions and fibrosis of skin: Secondary | ICD-10-CM | POA: Diagnosis not present

## 2020-09-14 DIAGNOSIS — Z85828 Personal history of other malignant neoplasm of skin: Secondary | ICD-10-CM | POA: Diagnosis not present

## 2020-09-14 DIAGNOSIS — L570 Actinic keratosis: Secondary | ICD-10-CM | POA: Diagnosis not present

## 2020-09-14 DIAGNOSIS — L821 Other seborrheic keratosis: Secondary | ICD-10-CM | POA: Diagnosis not present

## 2020-09-14 DIAGNOSIS — C44722 Squamous cell carcinoma of skin of right lower limb, including hip: Secondary | ICD-10-CM | POA: Diagnosis not present

## 2020-09-22 DIAGNOSIS — H401122 Primary open-angle glaucoma, left eye, moderate stage: Secondary | ICD-10-CM | POA: Diagnosis not present

## 2020-09-22 DIAGNOSIS — Z9889 Other specified postprocedural states: Secondary | ICD-10-CM | POA: Diagnosis not present

## 2020-09-22 DIAGNOSIS — H401113 Primary open-angle glaucoma, right eye, severe stage: Secondary | ICD-10-CM | POA: Diagnosis not present

## 2020-09-22 DIAGNOSIS — Z961 Presence of intraocular lens: Secondary | ICD-10-CM | POA: Diagnosis not present

## 2020-09-29 DIAGNOSIS — H401122 Primary open-angle glaucoma, left eye, moderate stage: Secondary | ICD-10-CM | POA: Diagnosis not present

## 2020-09-29 DIAGNOSIS — H401113 Primary open-angle glaucoma, right eye, severe stage: Secondary | ICD-10-CM | POA: Diagnosis not present

## 2020-09-29 DIAGNOSIS — Z9889 Other specified postprocedural states: Secondary | ICD-10-CM | POA: Diagnosis not present

## 2020-09-29 DIAGNOSIS — Z961 Presence of intraocular lens: Secondary | ICD-10-CM | POA: Diagnosis not present

## 2020-10-04 DIAGNOSIS — H401122 Primary open-angle glaucoma, left eye, moderate stage: Secondary | ICD-10-CM | POA: Diagnosis not present

## 2020-10-04 DIAGNOSIS — Z9889 Other specified postprocedural states: Secondary | ICD-10-CM | POA: Diagnosis not present

## 2020-10-04 DIAGNOSIS — H401113 Primary open-angle glaucoma, right eye, severe stage: Secondary | ICD-10-CM | POA: Diagnosis not present

## 2020-10-04 DIAGNOSIS — Z961 Presence of intraocular lens: Secondary | ICD-10-CM | POA: Diagnosis not present

## 2020-10-20 DIAGNOSIS — C44722 Squamous cell carcinoma of skin of right lower limb, including hip: Secondary | ICD-10-CM | POA: Diagnosis not present

## 2020-10-24 ENCOUNTER — Other Ambulatory Visit: Payer: Self-pay | Admitting: Medical

## 2020-10-25 NOTE — Telephone Encounter (Signed)
Pt's age 87, wt 77.7 kg, SCr 1.00, CrCl 55.04, last ov w/ HP 08/31/20.

## 2020-11-23 DIAGNOSIS — L819 Disorder of pigmentation, unspecified: Secondary | ICD-10-CM | POA: Diagnosis not present

## 2020-11-23 DIAGNOSIS — Z85828 Personal history of other malignant neoplasm of skin: Secondary | ICD-10-CM | POA: Diagnosis not present

## 2020-11-23 DIAGNOSIS — L905 Scar conditions and fibrosis of skin: Secondary | ICD-10-CM | POA: Diagnosis not present

## 2020-11-23 DIAGNOSIS — C44629 Squamous cell carcinoma of skin of left upper limb, including shoulder: Secondary | ICD-10-CM | POA: Diagnosis not present

## 2020-11-23 DIAGNOSIS — L821 Other seborrheic keratosis: Secondary | ICD-10-CM | POA: Diagnosis not present

## 2020-11-23 DIAGNOSIS — L814 Other melanin hyperpigmentation: Secondary | ICD-10-CM | POA: Diagnosis not present

## 2020-11-23 DIAGNOSIS — D485 Neoplasm of uncertain behavior of skin: Secondary | ICD-10-CM | POA: Diagnosis not present

## 2020-11-23 DIAGNOSIS — D229 Melanocytic nevi, unspecified: Secondary | ICD-10-CM | POA: Diagnosis not present

## 2020-11-23 DIAGNOSIS — L570 Actinic keratosis: Secondary | ICD-10-CM | POA: Diagnosis not present

## 2021-01-11 DIAGNOSIS — H401122 Primary open-angle glaucoma, left eye, moderate stage: Secondary | ICD-10-CM | POA: Diagnosis not present

## 2021-01-11 DIAGNOSIS — H401113 Primary open-angle glaucoma, right eye, severe stage: Secondary | ICD-10-CM | POA: Diagnosis not present

## 2021-01-11 DIAGNOSIS — H0102B Squamous blepharitis left eye, upper and lower eyelids: Secondary | ICD-10-CM | POA: Diagnosis not present

## 2021-01-11 DIAGNOSIS — Z961 Presence of intraocular lens: Secondary | ICD-10-CM | POA: Diagnosis not present

## 2021-01-11 DIAGNOSIS — Z9889 Other specified postprocedural states: Secondary | ICD-10-CM | POA: Diagnosis not present

## 2021-01-11 DIAGNOSIS — H0102A Squamous blepharitis right eye, upper and lower eyelids: Secondary | ICD-10-CM | POA: Diagnosis not present

## 2021-01-20 DIAGNOSIS — L570 Actinic keratosis: Secondary | ICD-10-CM | POA: Diagnosis not present

## 2021-01-20 DIAGNOSIS — Z85828 Personal history of other malignant neoplasm of skin: Secondary | ICD-10-CM | POA: Diagnosis not present

## 2021-01-20 DIAGNOSIS — Z08 Encounter for follow-up examination after completed treatment for malignant neoplasm: Secondary | ICD-10-CM | POA: Diagnosis not present

## 2021-02-04 ENCOUNTER — Other Ambulatory Visit: Payer: Self-pay | Admitting: *Deleted

## 2021-02-04 MED ORDER — LISINOPRIL 20 MG PO TABS
20.0000 mg | ORAL_TABLET | Freq: Every day | ORAL | 3 refills | Status: DC
Start: 2021-02-04 — End: 2021-06-08

## 2021-02-25 NOTE — Progress Notes (Deleted)
Cardiology Office Note    Date:  02/25/2021   ID:  Neil [name], DOB [birth year], MRN [id number]   PCP:  Antony Contras, [place]  Cardiologist:  Ena Dawley, MD (Inactive) *** Advanced Practice Provider:  No care team member to display Electrophysiologist:  None   [phone]   No chief complaint on file.   History of Present Illness:  Neil [name] is a 87 y.o. male with history of PAF on eliquis, HTN, LE edema.  Patient last saw Dr. Johney Frame 08/2020 complaining of fatigue felt multifactorial but concern for dehydration and HCTZ stopped.    Past Medical History:  Diagnosis Date   Glaucoma    Hypertension    Psoriasis     Past Surgical History:  Procedure Laterality Date   GLAUCOMA SURGERY Left    HERNIA REPAIR     HYDROCELE EXCISION / REPAIR      Current Medications: No outpatient medications have been marked as taking for the 03/09/21 encounter (Appointment) with Imogene Burn, PA-C.     Allergies:   Patient has no known allergies.   Social History   Socioeconomic History   Marital status: Married    Spouse name: Not on file   Number of children: Not on file   Years of education: Not on file   Highest education level: Not on file  Occupational History   Not on file  Tobacco Use   Smoking status: Former    Types: Pipe   Smokeless tobacco: Never   Tobacco comments:    quit >40 years ago  Vaping Use   Vaping Use: Never used  Substance and Sexual Activity   Alcohol use: Yes    Alcohol/week: 7.0 standard drinks    Types: 7 Standard drinks or equivalent per week    Comment: 1 drink daily   Drug use: Never   Sexual activity: Not on file  Other Topics Concern   Not on file  Social History Narrative   Wife has alzheimer's   Social Determinants of Health   Financial Resource Strain: Not on file  Food Insecurity: Not on file  Transportation Needs: Not on file  Physical Activity: Not on file  Stress:  Not on file  Social Connections: Not on file     Family History:  The patient's ***family history includes High blood pressure in his mother and sister; Stroke in his mother.   ROS:   Please see the history of present illness.    ROS All other systems reviewed and are negative.   PHYSICAL EXAM:   VS:  There were no vitals taken for this visit.  Physical Exam  GEN: Well nourished, well developed, in no acute distress  HEENT: normal  Neck: no JVD, carotid bruits, or masses Cardiac:RRR; no murmurs, rubs, or gallops  Respiratory:  clear to auscultation bilaterally, normal work of breathing GI: soft, nontender, nondistended, + BS Ext: without cyanosis, clubbing, or edema, Good distal pulses bilaterally MS: no deformity or atrophy  Skin: warm and dry, no rash Neuro:  Alert and Oriented x 3, Strength and sensation are intact Psych: euthymic mood, full affect  Wt Readings from Last 3 Encounters:  08/31/20 171 lb 3.2 oz (77.7 kg)  02/18/20 174 lb (78.9 kg)  06/12/19 177 lb 6.4 oz (80.5 kg)      Studies/Labs Reviewed:   EKG:  EKG is*** ordered today.  The ekg ordered today demonstrates ***  Recent Labs: No results  found for requested labs within last 8760 hours.   Lipid Panel    Component Value Date/Time   CHOL 160 02/12/2019 1024   TRIG 68 02/12/2019 1024   HDL 48 02/12/2019 1024   CHOLHDL 3.3 02/12/2019 1024   CHOLHDL 4.2 10/10/2018 0426   VLDL 13 10/10/2018 0426   LDLCALC 99 02/12/2019 1024    Additional studies/ records that were reviewed today include:  NST 7/2020The left ventricular ejection fraction is hyperdynamic (>65%). Nuclear stress EF: 68%. No wall motion abnormalities. There was no ST segment deviation noted during stress. This is a low risk study. No ischemia.   Candee Furbish, MD  Echo 10/2018 IMPRESSIONS     1. The left ventricle has normal systolic function, with an ejection  fraction of 55-60%. The cavity size was normal. There is moderate   concentric left ventricular hypertrophy. Left ventricular diastolic  function could not be evaluated secondary to  atrial fibrillation. No evidence of left ventricular regional wall motion  abnormalities.   2. The right ventricle has normal systolic function. The cavity was  mildly enlarged. There is no increase in right ventricular wall thickness.  Right ventricular systolic pressure is normal.   3. Right atrial size was moderately dilated.   4. The aortic valve is tricuspid. Mild thickening of the aortic valve. No  stenosis of the aortic valve.   5. There is mild to moderate dilatation of the ascending aorta measuring  45 mm.   Risk Assessment/Calculations:   {Does this patient have ATRIAL FIBRILLATION?:[phone]}     ASSESSMENT:    No diagnosis found.   PLAN:  In order of problems listed above: PAF on eliquis-not on AV nodal agents  Chronic LEE-normal LVEF, compression hose-HCTZ stopped LOV b/c concern for dehydration  HTN   Shared Decision Making/Informed Consent   {Are you ordering a CV Procedure (e.g. stress test, cath, DCCV, TEE, etc)?   Press F2        :[id number]}    Medication Adjustments/Labs and Tests Ordered: Current medicines are reviewed at length with the patient today.  Concerns regarding medicines are outlined above.  Medication changes, Labs and Tests ordered today are listed in the Patient Instructions below. There are no Patient Instructions on file for this visit.   Sumner Boast, PA-C  02/25/2021 7:26 AM    [facility] [address], [place], [place]  [address] Phone: [phone]; Fax: [phone]

## 2021-03-08 ENCOUNTER — Ambulatory Visit: Payer: Medicare Other | Admitting: Physician Assistant

## 2021-03-09 ENCOUNTER — Ambulatory Visit: Payer: Medicare Other | Admitting: Physician Assistant

## 2021-03-17 DIAGNOSIS — M109 Gout, unspecified: Secondary | ICD-10-CM | POA: Diagnosis not present

## 2021-03-17 DIAGNOSIS — K219 Gastro-esophageal reflux disease without esophagitis: Secondary | ICD-10-CM | POA: Diagnosis not present

## 2021-03-17 DIAGNOSIS — E782 Mixed hyperlipidemia: Secondary | ICD-10-CM | POA: Diagnosis not present

## 2021-03-17 DIAGNOSIS — R7303 Prediabetes: Secondary | ICD-10-CM | POA: Diagnosis not present

## 2021-03-17 DIAGNOSIS — R609 Edema, unspecified: Secondary | ICD-10-CM | POA: Diagnosis not present

## 2021-03-17 DIAGNOSIS — D6869 Other thrombophilia: Secondary | ICD-10-CM | POA: Diagnosis not present

## 2021-03-17 DIAGNOSIS — E538 Deficiency of other specified B group vitamins: Secondary | ICD-10-CM | POA: Diagnosis not present

## 2021-03-17 DIAGNOSIS — Z1389 Encounter for screening for other disorder: Secondary | ICD-10-CM | POA: Diagnosis not present

## 2021-03-17 DIAGNOSIS — E039 Hypothyroidism, unspecified: Secondary | ICD-10-CM | POA: Diagnosis not present

## 2021-03-17 DIAGNOSIS — I10 Essential (primary) hypertension: Secondary | ICD-10-CM | POA: Diagnosis not present

## 2021-03-17 DIAGNOSIS — Z0000 Encounter for general adult medical examination without abnormal findings: Secondary | ICD-10-CM | POA: Diagnosis not present

## 2021-03-17 DIAGNOSIS — I4891 Unspecified atrial fibrillation: Secondary | ICD-10-CM | POA: Diagnosis not present

## 2021-05-06 DIAGNOSIS — S81811A Laceration without foreign body, right lower leg, initial encounter: Secondary | ICD-10-CM | POA: Diagnosis not present

## 2021-05-11 DIAGNOSIS — H0102B Squamous blepharitis left eye, upper and lower eyelids: Secondary | ICD-10-CM | POA: Diagnosis not present

## 2021-05-11 DIAGNOSIS — H401122 Primary open-angle glaucoma, left eye, moderate stage: Secondary | ICD-10-CM | POA: Diagnosis not present

## 2021-05-11 DIAGNOSIS — H401113 Primary open-angle glaucoma, right eye, severe stage: Secondary | ICD-10-CM | POA: Diagnosis not present

## 2021-05-11 DIAGNOSIS — H0102A Squamous blepharitis right eye, upper and lower eyelids: Secondary | ICD-10-CM | POA: Diagnosis not present

## 2021-05-11 DIAGNOSIS — Z9889 Other specified postprocedural states: Secondary | ICD-10-CM | POA: Diagnosis not present

## 2021-05-11 DIAGNOSIS — Z961 Presence of intraocular lens: Secondary | ICD-10-CM | POA: Diagnosis not present

## 2021-05-16 DIAGNOSIS — L821 Other seborrheic keratosis: Secondary | ICD-10-CM | POA: Diagnosis not present

## 2021-05-16 DIAGNOSIS — L400 Psoriasis vulgaris: Secondary | ICD-10-CM | POA: Diagnosis not present

## 2021-05-18 ENCOUNTER — Telehealth: Payer: Self-pay | Admitting: Cardiology

## 2021-05-18 MED ORDER — APIXABAN 5 MG PO TABS: 5.0000 mg | ORAL_TABLET | Freq: Two times a day (BID) | ORAL | 5 refills | Status: DC

## 2021-05-18 NOTE — Telephone Encounter (Signed)
Prescription refill request for Eliquis received. Indication: Afib  Last office visit:08/31/20 Neil [name])  Scr: 1.00 (09/09/20) Age: 88 Weight: 77.7kg  Appropriate dose and refill sent to requested pharmacy.

## 2021-05-18 NOTE — Telephone Encounter (Signed)
°*  STAT* If patient is at the pharmacy, call can be transferred to refill team.   1. Which medications need to be refilled? (please list name of each medication and dose if known) ELIQUIS 5 MG TABS tablet  2. Which pharmacy/location (including street and city if local pharmacy) is medication to be sent to? [id number] - [place], [place] - [address]  3. Do they need a 30 day or 90 day supply? 60 day

## 2021-05-31 NOTE — Progress Notes (Signed)
Cardiology Office Note    Date:  06/08/2021   ID:  Neil [name], DOB 24-Jun-1929, MRN [id number]   PCP:  Antony Contras, MD   [place]  Cardiologist:  Freada Bergeron, MD   Advanced Practice Provider:  No care team member to display Electrophysiologist:  None   [phone]   Chief Complaint  Patient presents with   Follow-up    History of Present Illness:  Neil [name] is a 88 y.o. male with history of PAF on eliquis, HTN, LE edema.  Patient last saw Dr. Johney Frame 08/2020 complaining of fatigue felt multifactorial but concern for dehydration and HCTZ stopped.  Patient comes in for f/u. BP has been running high and PCP increased lisinopril 40 mg daily. Patient was doing exercise classes for 30 min daily but stopped because of fatigue. Stopping HCTZ didn't improve fatigue. Patient admits to getting extra salt and sugar in diet. No chest pain, dyspnea, palpitations, dizziness or presyncope. Patient has lost 10 lbs since his wife died of covid19 and then lost his son with down's syndrome a year later of covid19.     Past Medical History:  Diagnosis Date   Glaucoma    Hypertension    Psoriasis     Past Surgical History:  Procedure Laterality Date   GLAUCOMA SURGERY Left    HERNIA REPAIR     HYDROCELE EXCISION / REPAIR      Current Medications: Current Meds  Medication Sig   apixaban (ELIQUIS) 5 MG TABS tablet Take 1 tablet (5 mg total) by mouth 2 (two) times daily.   bimatoprost (LUMIGAN) 0.01 % SOLN Place 1 drop into the right eye at bedtime.   brimonidine-timolol (COMBIGAN) 0.2-0.5 % ophthalmic solution Place 1 drop into the right eye 2 (two) times a day.   dorzolamide (TRUSOPT) 2 % ophthalmic solution Place 1 drop into the right eye 2 (two) times daily.   ENBREL 50 MG/ML injection Inject 1 mL into the skin once a week.   levothyroxine (SYNTHROID) 75 MCG tablet Take 75 mcg by mouth daily before breakfast.   lisinopril (ZESTRIL) 40 MG  tablet Take 40 mg by mouth daily.   Multiple Vitamin (MULTIVITAMIN ADULT PO) Take 1 tablet by mouth daily.   Netarsudil Dimesylate (RHOPRESSA) 0.02 % SOLN Place 1 drop into the right eye at bedtime.   omeprazole (PRILOSEC) 40 MG capsule Take 40 mg by mouth daily.   vitamin B-12 (CYANOCOBALAMIN) 1000 MCG tablet Take 1,000 mcg by mouth daily.     Allergies:   Patient has no known allergies.   Social History   Socioeconomic History   Marital status: Married    Spouse name: Not on file   Number of children: Not on file   Years of education: Not on file   Highest education level: Not on file  Occupational History   Not on file  Tobacco Use   Smoking status: Former    Types: Pipe    Passive exposure: Never   Smokeless tobacco: Never   Tobacco comments:    quit >40 years ago  Vaping Use   Vaping Use: Never used  Substance and Sexual Activity   Alcohol use: Yes    Alcohol/week: 7.0 standard drinks    Types: 7 Standard drinks or equivalent per week    Comment: 1 drink daily   Drug use: Never   Sexual activity: Not on file  Other Topics Concern   Not on file  Social History  Narrative   Wife has alzheimer's   Social Determinants of Health   Financial Resource Strain: Not on file  Food Insecurity: Not on file  Transportation Needs: Not on file  Physical Activity: Not on file  Stress: Not on file  Social Connections: Not on file     Family History:  The patient's  family history includes High blood pressure in his mother and sister; Stroke in his mother.   ROS:   Please see the history of present illness.    ROS All other systems reviewed and are negative.   PHYSICAL EXAM:   VS:  BP (!) 140/96    Pulse 63    Ht 6\' 1"  (1.854 m)    Wt 165 lb (74.8 kg)    SpO2 98%    BMI 21.77 kg/m   Physical Exam  GEN: Elderly, thin, in no acute distress  Neck: no JVD, carotid bruits, or masses Cardiac:RRR; with some skipping, 1/6 systolic murmur Respiratory:  clear to auscultation  bilaterally, normal work of breathing GI: soft, nontender, nondistended, + BS Ext: without cyanosis, clubbing, or edema, Good distal pulses bilaterally Neuro:  Alert and Oriented x 3 Psych: euthymic mood, full affect  Wt Readings from Last 3 Encounters:  06/08/21 165 lb (74.8 kg)  08/31/20 171 lb 3.2 oz (77.7 kg)  02/18/20 174 lb (78.9 kg)      Studies/Labs Reviewed:   EKG:  EKG is  ordered today.  The ekg ordered today demonstrates NSR  Recent Labs: No results found for requested labs within last 8760 hours.   Lipid Panel    Component Value Date/Time   CHOL 160 02/12/2019 1024   TRIG 68 02/12/2019 1024   HDL 48 02/12/2019 1024   CHOLHDL 3.3 02/12/2019 1024   CHOLHDL 4.2 10/10/2018 0426   VLDL 13 10/10/2018 0426   LDLCALC 99 02/12/2019 1024    Additional studies/ records that were reviewed today include:   Echo 10/10/18 IMPRESSIONS     1. The left ventricle has normal systolic function, with an ejection  fraction of 55-60%. The cavity size was normal. There is moderate  concentric left ventricular hypertrophy. Left ventricular diastolic  function could not be evaluated secondary to  atrial fibrillation. No evidence of left ventricular regional wall motion  abnormalities.   2. The right ventricle has normal systolic function. The cavity was  mildly enlarged. There is no increase in right ventricular wall thickness.  Right ventricular systolic pressure is normal.   3. Right atrial size was moderately dilated.   4. The aortic valve is tricuspid. Mild thickening of the aortic valve. No  stenosis of the aortic valve.   5. There is mild to moderate dilatation of the ascending aorta measuring  45 mm.   NST 10/25/18  The left ventricular ejection fraction is hyperdynamic (>65%). Nuclear stress EF: 68%. No wall motion abnormalities. There was no ST segment deviation noted during stress. This is a low risk study. No ischemia.   Candee Furbish, MD   Risk  Assessment/Calculations:    CHA2DS2-VASc Score = 3   This indicates a 3.2% annual risk of stroke. The patient's score is based upon: CHF History: 0 HTN History: 1 Diabetes History: 0 Stroke History: 0 Vascular Disease History: 0 Age Score: 2 Gender Score: 0        ASSESSMENT:    1. Paroxysmal atrial fibrillation (HCC)   2. Lower extremity edema   3. Essential hypertension      PLAN:  In  order of problems listed above:  PAF on eliquis-not on AV nodal agents-in NSR and no recent palpitations. Renal ok in 03/17/21. Recheck today.   Chronic LEE-normal LVEF, compression hose-HCTZ stopped LOV b/c concern for dehydration-patient appears dehydrated today-encouraged to increase water intake.  HTN BP has been running high and PCP increased lisinopril 40 mg daily. Readings trending down. Getting extra salt-2 gm sodium diet, increase hydration, call if BP remains over 160/85.   Shared Decision Making/Informed Consent        Medication Adjustments/Labs and Tests Ordered: Current medicines are reviewed at length with the patient today.  Concerns regarding medicines are outlined above.  Medication changes, Labs and Tests ordered today are listed in the Patient Instructions below. Patient Instructions  Medication Instructions:  Your physician recommends that you continue on your current medications as directed. Please refer to the Current Medication list given to you today.  *If you need a refill on your cardiac medications before your next appointment, please call your pharmacy*   Lab Work: None ordered  If you have labs (blood work) drawn today and your tests are completely normal, you will receive your results only by: [place] (if you have MyChart) OR A paper copy in the mail If you have any lab test that is abnormal or we need to change your treatment, we will call you to review the results.   Testing/Procedures: None ordered   Follow-Up: At [facility],  you and your health needs are our priority.  As part of our continuing mission to provide you with exceptional heart care, we have created designated Provider Care Teams.  These Care Teams include your primary Cardiologist (physician) and Advanced Practice Providers (APPs -  Physician Assistants and Nurse Practitioners) who all work together to provide you with the care you need, when you need it.  We recommend signing up for the patient portal called "MyChart".  Sign up information is provided on this After Visit Summary.  MyChart is used to connect with patients for Virtual Visits (Telemedicine).  Patients are able to view lab/test results, encounter notes, upcoming appointments, etc.  Non-urgent messages can be sent to your provider as well.   To learn more about what you can do with MyChart, go to [email or web address].    Your next appointment:   6 month(s)  The format for your next appointment:   In Person  Provider:   Gwyndolyn Kaufman, MD   Other Instructions Please increase your Water Intake  Monitor your blood pressures, 2 hours after you take your morning medications.   Two Gram Sodium Diet 2000 mg  What is Sodium? Sodium is a mineral found naturally in many foods. The most significant source of sodium in the diet is table salt, which is about 40% sodium.  Processed, convenience, and preserved foods also contain a large amount of sodium.  The body needs only 500 mg of sodium daily to function,  A normal diet provides more than enough sodium even if you do not use salt.  Why Limit Sodium? A build up of sodium in the body can cause thirst, increased blood pressure, shortness of breath, and water retention.  Decreasing sodium in the diet can reduce edema and risk of heart attack or stroke associated with high blood pressure.  Keep in mind that there are many other factors involved in these health problems.  Heredity, obesity, lack of exercise, cigarette smoking, stress and what you  eat all play a role.  General Guidelines: Do not  add salt at the table or in cooking.  One teaspoon of salt contains over 2 grams of sodium. Read food labels Avoid processed and convenience foods Ask your dietitian before eating any foods not dicussed in the menu planning guidelines Consult your physician if you wish to use a salt substitute or a sodium containing medication such as antacids.  Limit milk and milk products to 16 oz (2 cups) per day.  Shopping Hints: READ LABELS!! "Dietetic" does not necessarily mean low sodium. Salt and other sodium ingredients are often added to foods during processing.    Menu Planning Guidelines Food Group Choose More Often Avoid  Beverages (see also the milk group All fruit juices, low-sodium, salt-free vegetables juices, low-sodium carbonated beverages Regular vegetable or tomato juices, commercially softened water used for drinking or cooking  Breads and Cereals Enriched white, wheat, rye and pumpernickel bread, hard rolls and dinner rolls; muffins, cornbread and waffles; most dry cereals, cooked cereal without added salt; unsalted crackers and breadsticks; low sodium or homemade bread crumbs Bread, rolls and crackers with salted tops; quick breads; instant hot cereals; pancakes; commercial bread stuffing; self-rising flower and biscuit mixes; regular bread crumbs or cracker crumbs  Desserts and Sweets Desserts and sweets mad with mild should be within allowance Instant pudding mixes and cake mixes  Fats Butter or margarine; vegetable oils; unsalted salad dressings, regular salad dressings limited to 1 Tbs; light, sour and heavy cream Regular salad dressings containing bacon fat, bacon bits, and salt pork; snack dips made with instant soup mixes or processed cheese; salted nuts  Fruits Most fresh, frozen and canned fruits Fruits processed with salt or sodium-containing ingredient (some dried fruits are processed with sodium sulfites        Vegetables  Fresh, frozen vegetables and low- sodium canned vegetables Regular canned vegetables, sauerkraut, pickled vegetables, and others prepared in brine; frozen vegetables in sauces; vegetables seasoned with ham, bacon or salt pork  Condiments, Sauces, Miscellaneous  Salt substitute with physician's approval; pepper, herbs, spices; vinegar, lemon or lime juice; hot pepper sauce; garlic powder, onion powder, low sodium soy sauce (1 Tbs.); low sodium condiments (ketchup, chili sauce, mustard) in limited amounts (1 tsp.) fresh ground horseradish; unsalted tortilla chips, pretzels, potato chips, popcorn, salsa (1/4 cup) Any seasoning made with salt including garlic salt, celery salt, onion salt, and seasoned salt; sea salt, rock salt, kosher salt; meat tenderizers; monosodium glutamate; mustard, regular soy sauce, barbecue, sauce, chili sauce, teriyaki sauce, steak sauce, Worcestershire sauce, and most flavored vinegars; canned gravy and mixes; regular condiments; salted snack foods, olives, picles, relish, horseradish sauce, catsup   Food preparation: Try these seasonings Meats:    Pork Sage, onion Serve with applesauce  Chicken Poultry seasoning, thyme, parsley Serve with cranberry sauce  Lamb Curry powder, rosemary, garlic, thyme Serve with mint sauce or jelly  Veal Marjoram, basil Serve with current jelly, cranberry sauce  Beef Pepper, bay leaf Serve with dry mustard, unsalted chive butter  Fish [place] leaf, dill Serve with unsalted lemon butter, unsalted parsley butter  Vegetables:    Asparagus Lemon juice   Broccoli Lemon juice   Carrots Mustard dressing parsley, mint, nutmeg, glazed with unsalted butter and sugar   Green beans Marjoram, lemon juice, nutmeg,dill seed   Tomatoes Basil, marjoram, onion   Spice /blend for [employer]" 4 tsp ground thyme 1 tsp ground sage 3 tsp ground rosemary 4 tsp ground marjoram   Test your knowledge A product that says "Salt Free" may still contain  sodium. True or  False Garlic Powder and Hot Pepper Sauce an be used as alternative seasonings.True or False Processed foods have more sodium than fresh foods.  True or False Canned Vegetables have less sodium than froze True or False   WAYS TO DECREASE YOUR SODIUM INTAKE Avoid the use of added salt in cooking and at the table.  Table salt (and other prepared seasonings which contain salt) is probably one of the greatest sources of sodium in the diet.  Unsalted foods can gain flavor from the sweet, sour, and butter taste sensations of herbs and spices.  Instead of using salt for seasoning, try the following seasonings with the foods listed.  Remember: how you use them to enhance natural food flavors is limited only by your creativity... Allspice-Meat, fish, eggs, fruit, peas, red and yellow vegetables Almond Extract-Fruit baked goods Anise Seed-Sweet breads, fruit, carrots, beets, cottage cheese, cookies (tastes like licorice) Basil-Meat, fish, eggs, vegetables, rice, vegetables salads, soups, sauces Bay Leaf-Meat, fish, stews, poultry Burnet-Salad, vegetables (cucumber-like flavor) Caraway Seed-Bread, cookies, cottage cheese, meat, vegetables, cheese, rice Cardamon-Baked goods, fruit, soups Celery Powder or seed-Salads, salad dressings, sauces, meatloaf, soup, bread.Do not use  celery salt Chervil-Meats, salads, fish, eggs, vegetables, cottage cheese (parsley-like flavor) Chili Power-Meatloaf, chicken cheese, corn, eggplant, egg dishes Chives-Salads cottage cheese, egg dishes, soups, vegetables, sauces Cilantro-Salsa, casseroles Cinnamon-Baked goods, fruit, pork, lamb, chicken, carrots Cloves-Fruit, baked goods, fish, pot roast, green beans, beets, carrots Coriander-Pastry, cookies, meat, salads, cheese (lemon-orange flavor) Cumin-Meatloaf, fish,cheese, eggs, cabbage,fruit pie (caraway flavor) [employer], fruit, eggs, fish, poultry, cottage cheese, vegetables Dill Seed-Meat, cottage cheese, poultry,  vegetables, fish, salads, bread Fennel Seed-Bread, cookies, apples, pork, eggs, fish, beets, cabbage, cheese, Licorice-like flavor Garlic-(buds or powder) Salads, meat, poultry, fish, bread, butter, vegetables, potatoes.Do not  use garlic salt Ginger-Fruit, vegetables, baked goods, meat, fish, poultry Horseradish Root-Meet, vegetables, butter Lemon Juice or Extract-Vegetables, fruit, tea, baked goods, fish salads Mace-Baked goods fruit, vegetables, fish, poultry (taste like nutmeg) Maple Extract-Syrups Marjoram-Meat, chicken, fish, vegetables, breads, green salads (taste like Sage) Mint-Tea, lamb, sherbet, vegetables, desserts, carrots, cabbage Mustard, Dry or Seed-Cheese, eggs, meats, vegetables, poultry Nutmeg-Baked goods, fruit, chicken, eggs, vegetables, desserts Onion Powder-Meat, fish, poultry, vegetables, cheese, eggs, bread, rice salads (Do not use   Onion salt) Orange Extract-Desserts, baked goods Oregano-Pasta, eggs, cheese, onions, pork, lamb, fish, chicken, vegetables, green salads Paprika-Meat, fish, poultry, eggs, cheese, vegetables Parsley Flakes-Butter, vegetables, meat fish, poultry, eggs, bread, salads (certain forms may   Contain sodium Pepper-Meat fish, poultry, vegetables, eggs Peppermint Extract-Desserts, baked goods Poppy Seed-Eggs, bread, cheese, fruit dressings, baked goods, noodles, vegetables, cottage  [employer], poultry, meat, fish, cauliflower, turnips,eggs bread Saffron-Rice, bread, veal, chicken, fish, eggs Sage-Meat, fish, poultry, onions, eggplant, tomateos, pork, stews Savory-Eggs, salads, poultry, meat, rice, vegetables, soups, pork Tarragon-Meat, poultry, fish, eggs, butter, vegetables (licorice-like flavor)  Thyme-Meat, poultry, fish, eggs, vegetables, (clover-like flavor), sauces, soups Tumeric-Salads, butter, eggs, fish, rice, vegetables (saffron-like flavor) Vanilla Extract-Baked goods,  candy Vinegar-Salads, vegetables, meat marinades Walnut Extract-baked goods, candy   2. Choose your Foods Wisely   The following is a list of foods to avoid which are high in sodium:  Meats-Avoid all smoked, canned, salt cured, dried and kosher meat and fish as well as Anchovies   [employer] meats:Bologna, Liverwurst, Pastrami Canned meat or fish  Marinated herring Caviar    Pepperoni Corned Beef   Pizza Dried chipped beef  Salami Frozen breaded fish or meat Salt pork Frankfurters  or hot dogs  Sardines Gefilte fish   Sausage Ham (boiled ham, Proscuitto Smoked butt    spiced ham)   Spam      TV Dinners Vegetables Canned vegetables (Regular) Relish Canned mushrooms  Sauerkraut Olives    Tomato juice Pickles  Bakery and Dessert Products Canned puddings  Cream pies Cheesecake   Decorated cakes Cookies  Beverages/Juices Tomato juice, regular  Gatorade   V-8 vegetable juice, regular  Breads and Cereals Biscuit mixes   Salted potato chips, corn chips, pretzels Bread stuffing mixes  Salted crackers and rolls Pancake and waffle mixes Self-rising flour  Seasonings Accent    Meat sauces Barbecue sauce  Meat tenderizer Catsup    Monosodium glutamate (MSG) Celery salt   Onion salt Chili sauce   Prepared mustard Garlic salt   Salt, seasoned salt, sea salt Gravy mixes   Soy sauce Horseradish   Steak sauce Ketchup   Tartar sauce Lite salt    Teriyaki sauce Marinade mixes   Worcestershire sauce  Others Baking powder   Cocoa and cocoa mixes Baking soda   Commercial casserole mixes Candy-caramels, chocolate  Dehydrated soups    Bars, fudge,nougats  Instant rice and pasta mixes Canned broth or soup  Maraschino cherries Cheese, aged and processed cheese and cheese spreads  Learning Assessment Quiz  Indicated T (for True) or F (for False) for each of the following statements:  _____ Fresh fruits and vegetables and unprocessed grains are generally low in  sodium _____ Water may contain a considerable amount of sodium, depending on the source _____ You can always tell if a food is high in sodium by tasting it _____ Certain laxatives my be high in sodium and should be avoided unless prescribed   by a physician or pharmacist _____ Salt substitutes may be used freely by anyone on a sodium restricted diet _____ Sodium is present in table salt, food additives and as a natural component of   most foods _____ Table salt is approximately 90% sodium _____ Limiting sodium intake may help prevent excess fluid accumulation in the body _____ On a sodium-restricted diet, seasonings such as bouillon soy sauce, and    cooking wine should be used in place of table salt _____ On an ingredient list, a product which lists monosodium glutamate as the first   ingredient is an appropriate food to include on a low sodium diet  Circle the best answer(s) to the following statements (Hint: there may be more than one correct answer)  11. On a low-sodium diet, some acceptable snack items are:    A. Olives  F. Bean dip   K. Grapefruit juice    B. Salted Pretzels G. Commercial Popcorn   L. Canned peaches    C. Carrot Sticks  H. Bouillon   M. Unsalted nuts   D. [place] fries  I. Peanut butter crackers N. Salami   E. Sweet pickles J. Tomato Juice   O. Pizza  12.  Seasonings that may be used freely on a reduced - sodium diet include   A. Lemon wedges F.Monosodium glutamate K. Celery seed    B.Soysauce   G. Pepper   L. Mustard powder   C. Sea salt  H. Cooking wine  M. Onion flakes   D. Vinegar  E. Prepared horseradish N. Salsa   E. Sage   J. Worcestershire sauce  O. Chutney    Signed, Ermalinda Barrios, PA-C  06/08/2021 11:52 AM    [id number]  [address] [place], [place]  [address] Phone: [phone]; Fax: [phone]

## 2021-06-08 ENCOUNTER — Other Ambulatory Visit: Payer: Self-pay

## 2021-06-08 ENCOUNTER — Encounter: Payer: Self-pay | Admitting: Physician Assistant

## 2021-06-08 ENCOUNTER — Ambulatory Visit (INDEPENDENT_AMBULATORY_CARE_PROVIDER_SITE_OTHER): Payer: Medicare Other | Admitting: Physician Assistant

## 2021-06-08 VITALS — BP 140/96 | HR 63 | Ht 73.0 in | Wt 165.0 lb

## 2021-06-08 DIAGNOSIS — I480 Paroxysmal atrial fibrillation: Secondary | ICD-10-CM | POA: Diagnosis not present

## 2021-06-08 DIAGNOSIS — R600 Localized edema: Secondary | ICD-10-CM

## 2021-06-08 DIAGNOSIS — I10 Essential (primary) hypertension: Secondary | ICD-10-CM | POA: Diagnosis not present

## 2021-06-08 LAB — BASIC METABOLIC PANEL
BUN/Creatinine Ratio: 13 (ref 10–24)
BUN: 13 mg/dL (ref 10–36)
CO2: 25 mmol/L (ref 20–29)
Calcium: 8.9 mg/dL (ref 8.6–10.2)
Chloride: 101 mmol/L (ref 96–106)
Creatinine, Ser: 1.03 mg/dL (ref 0.76–1.27)
Glucose: 93 mg/dL (ref 70–99)
Potassium: 4.2 mmol/L (ref 3.5–5.2)
Sodium: 139 mmol/L (ref 134–144)
eGFR: 69 mL/min/{1.73_m2} (ref >59)

## 2021-06-08 NOTE — Patient Instructions (Addendum)
Medication Instructions:  Your physician recommends that you continue on your current medications as directed. Please refer to the Current Medication list given to you today.  *If you need a refill on your cardiac medications before your next appointment, please call your pharmacy*   Lab Work: None ordered  If you have labs (blood work) drawn today and your tests are completely normal, you will receive your results only by: [place] (if you have MyChart) OR A paper copy in the mail If you have any lab test that is abnormal or we need to change your treatment, we will call you to review the results.   Testing/Procedures: None ordered   Follow-Up: At [facility], you and your health needs are our priority.  As part of our continuing mission to provide you with exceptional heart care, we have created designated Provider Care Teams.  These Care Teams include your primary Cardiologist (physician) and Advanced Practice Providers (APPs -  Physician Assistants and Nurse Practitioners) who all work together to provide you with the care you need, when you need it.  We recommend signing up for the patient portal called "MyChart".  Sign up information is provided on this After Visit Summary.  MyChart is used to connect with patients for Virtual Visits (Telemedicine).  Patients are able to view lab/test results, encounter notes, upcoming appointments, etc.  Non-urgent messages can be sent to your provider as well.   To learn more about what you can do with MyChart, go to [email or web address].    Your next appointment:   6 month(s)  The format for your next appointment:   In Person  Provider:   Gwyndolyn Kaufman, MD   Other Instructions Please increase your Water Intake  Monitor your blood pressures, 2 hours after you take your morning medications.   Two Gram Sodium Diet 2000 mg  What is Sodium? Sodium is a mineral found naturally in many foods. The most significant source of  sodium in the diet is table salt, which is about 40% sodium.  Processed, convenience, and preserved foods also contain a large amount of sodium.  The body needs only 500 mg of sodium daily to function,  A normal diet provides more than enough sodium even if you do not use salt.  Why Limit Sodium? A build up of sodium in the body can cause thirst, increased blood pressure, shortness of breath, and water retention.  Decreasing sodium in the diet can reduce edema and risk of heart attack or stroke associated with high blood pressure.  Keep in mind that there are many other factors involved in these health problems.  Heredity, obesity, lack of exercise, cigarette smoking, stress and what you eat all play a role.  General Guidelines: Do not add salt at the table or in cooking.  One teaspoon of salt contains over 2 grams of sodium. Read food labels Avoid processed and convenience foods Ask your dietitian before eating any foods not dicussed in the menu planning guidelines Consult your physician if you wish to use a salt substitute or a sodium containing medication such as antacids.  Limit milk and milk products to 16 oz (2 cups) per day.  Shopping Hints: READ LABELS!! "Dietetic" does not necessarily mean low sodium. Salt and other sodium ingredients are often added to foods during processing.    Menu Planning Guidelines Food Group Choose More Often Avoid  Beverages (see also the milk group All fruit juices, low-sodium, salt-free vegetables juices, low-sodium carbonated beverages Regular vegetable or  tomato juices, commercially softened water used for drinking or cooking  Breads and Cereals Enriched white, wheat, rye and pumpernickel bread, hard rolls and dinner rolls; muffins, cornbread and waffles; most dry cereals, cooked cereal without added salt; unsalted crackers and breadsticks; low sodium or homemade bread crumbs Bread, rolls and crackers with salted tops; quick breads; instant hot cereals;  pancakes; commercial bread stuffing; self-rising flower and biscuit mixes; regular bread crumbs or cracker crumbs  Desserts and Sweets Desserts and sweets mad with mild should be within allowance Instant pudding mixes and cake mixes  Fats Butter or margarine; vegetable oils; unsalted salad dressings, regular salad dressings limited to 1 Tbs; light, sour and heavy cream Regular salad dressings containing bacon fat, bacon bits, and salt pork; snack dips made with instant soup mixes or processed cheese; salted nuts  Fruits Most fresh, frozen and canned fruits Fruits processed with salt or sodium-containing ingredient (some dried fruits are processed with sodium sulfites        Vegetables Fresh, frozen vegetables and low- sodium canned vegetables Regular canned vegetables, sauerkraut, pickled vegetables, and others prepared in brine; frozen vegetables in sauces; vegetables seasoned with ham, bacon or salt pork  Condiments, Sauces, Miscellaneous  Salt substitute with physician's approval; pepper, herbs, spices; vinegar, lemon or lime juice; hot pepper sauce; garlic powder, onion powder, low sodium soy sauce (1 Tbs.); low sodium condiments (ketchup, chili sauce, mustard) in limited amounts (1 tsp.) fresh ground horseradish; unsalted tortilla chips, pretzels, potato chips, popcorn, salsa (1/4 cup) Any seasoning made with salt including garlic salt, celery salt, onion salt, and seasoned salt; sea salt, rock salt, kosher salt; meat tenderizers; monosodium glutamate; mustard, regular soy sauce, barbecue, sauce, chili sauce, teriyaki sauce, steak sauce, Worcestershire sauce, and most flavored vinegars; canned gravy and mixes; regular condiments; salted snack foods, olives, picles, relish, horseradish sauce, catsup   Food preparation: Try these seasonings Meats:    Pork Sage, onion Serve with applesauce  Chicken Poultry seasoning, thyme, parsley Serve with cranberry sauce  Lamb Curry powder, rosemary, garlic,  thyme Serve with mint sauce or jelly  Veal Marjoram, basil Serve with current jelly, cranberry sauce  Beef Pepper, bay leaf Serve with dry mustard, unsalted chive butter  Fish [place] leaf, dill Serve with unsalted lemon butter, unsalted parsley butter  Vegetables:    Asparagus Lemon juice   Broccoli Lemon juice   Carrots Mustard dressing parsley, mint, nutmeg, glazed with unsalted butter and sugar   Green beans Marjoram, lemon juice, nutmeg,dill seed   Tomatoes Basil, marjoram, onion   Spice /blend for [employer]" 4 tsp ground thyme 1 tsp ground sage 3 tsp ground rosemary 4 tsp ground marjoram   Test your knowledge A product that says "Salt Free" may still contain sodium. True or False Garlic Powder and Hot Pepper Sauce an be used as alternative seasonings.True or False Processed foods have more sodium than fresh foods.  True or False Canned Vegetables have less sodium than froze True or False   WAYS TO DECREASE YOUR SODIUM INTAKE Avoid the use of added salt in cooking and at the table.  Table salt (and other prepared seasonings which contain salt) is probably one of the greatest sources of sodium in the diet.  Unsalted foods can gain flavor from the sweet, sour, and butter taste sensations of herbs and spices.  Instead of using salt for seasoning, try the following seasonings with the foods listed.  Remember: how you use them to enhance natural food flavors is limited  only by your creativity... Allspice-Meat, fish, eggs, fruit, peas, red and yellow vegetables Almond Extract-Fruit baked goods Anise Seed-Sweet breads, fruit, carrots, beets, cottage cheese, cookies (tastes like licorice) Basil-Meat, fish, eggs, vegetables, rice, vegetables salads, soups, sauces Bay Leaf-Meat, fish, stews, poultry Burnet-Salad, vegetables (cucumber-like flavor) Caraway Seed-Bread, cookies, cottage cheese, meat, vegetables, cheese, rice Cardamon-Baked goods, fruit, soups Celery Powder or seed-Salads, salad  dressings, sauces, meatloaf, soup, bread.Do not use  celery salt Chervil-Meats, salads, fish, eggs, vegetables, cottage cheese (parsley-like flavor) Chili Power-Meatloaf, chicken cheese, corn, eggplant, egg dishes Chives-Salads cottage cheese, egg dishes, soups, vegetables, sauces Cilantro-Salsa, casseroles Cinnamon-Baked goods, fruit, pork, lamb, chicken, carrots Cloves-Fruit, baked goods, fish, pot roast, green beans, beets, carrots Coriander-Pastry, cookies, meat, salads, cheese (lemon-orange flavor) Cumin-Meatloaf, fish,cheese, eggs, cabbage,fruit pie (caraway flavor) [employer], fruit, eggs, fish, poultry, cottage cheese, vegetables Dill Seed-Meat, cottage cheese, poultry, vegetables, fish, salads, bread Fennel Seed-Bread, cookies, apples, pork, eggs, fish, beets, cabbage, cheese, Licorice-like flavor Garlic-(buds or powder) Salads, meat, poultry, fish, bread, butter, vegetables, potatoes.Do not  use garlic salt Ginger-Fruit, vegetables, baked goods, meat, fish, poultry Horseradish Root-Meet, vegetables, butter Lemon Juice or Extract-Vegetables, fruit, tea, baked goods, fish salads Mace-Baked goods fruit, vegetables, fish, poultry (taste like nutmeg) Maple Extract-Syrups Marjoram-Meat, chicken, fish, vegetables, breads, green salads (taste like Sage) Mint-Tea, lamb, sherbet, vegetables, desserts, carrots, cabbage Mustard, Dry or Seed-Cheese, eggs, meats, vegetables, poultry Nutmeg-Baked goods, fruit, chicken, eggs, vegetables, desserts Onion Powder-Meat, fish, poultry, vegetables, cheese, eggs, bread, rice salads (Do not use   Onion salt) Orange Extract-Desserts, baked goods Oregano-Pasta, eggs, cheese, onions, pork, lamb, fish, chicken, vegetables, green salads Paprika-Meat, fish, poultry, eggs, cheese, vegetables Parsley Flakes-Butter, vegetables, meat fish, poultry, eggs, bread, salads (certain forms may   Contain sodium Pepper-Meat fish, poultry, vegetables,  eggs Peppermint Extract-Desserts, baked goods Poppy Seed-Eggs, bread, cheese, fruit dressings, baked goods, noodles, vegetables, cottage  [employer], poultry, meat, fish, cauliflower, turnips,eggs bread Saffron-Rice, bread, veal, chicken, fish, eggs Sage-Meat, fish, poultry, onions, eggplant, tomateos, pork, stews Savory-Eggs, salads, poultry, meat, rice, vegetables, soups, pork Tarragon-Meat, poultry, fish, eggs, butter, vegetables (licorice-like flavor)  Thyme-Meat, poultry, fish, eggs, vegetables, (clover-like flavor), sauces, soups Tumeric-Salads, butter, eggs, fish, rice, vegetables (saffron-like flavor) Vanilla Extract-Baked goods, candy Vinegar-Salads, vegetables, meat marinades Walnut Extract-baked goods, candy   2. Choose your Foods Wisely   The following is a list of foods to avoid which are high in sodium:  Meats-Avoid all smoked, canned, salt cured, dried and kosher meat and fish as well as Anchovies   [employer] meats:Bologna, Liverwurst, Pastrami Canned meat or fish  Marinated herring Caviar    Pepperoni Corned Beef   Pizza Dried chipped beef  Salami Frozen breaded fish or meat Salt pork Frankfurters or hot dogs  Sardines Gefilte fish   Sausage Ham (boiled ham, Proscuitto Smoked butt    spiced ham)   Spam      TV Dinners Vegetables Canned vegetables (Regular) Relish Canned mushrooms  Sauerkraut Olives    Tomato juice Pickles  Bakery and Dessert Products Canned puddings  Cream pies Cheesecake   Decorated cakes Cookies  Beverages/Juices Tomato juice, regular  Gatorade   V-8 vegetable juice, regular  Breads and Cereals Biscuit mixes   Salted potato chips, corn chips, pretzels Bread stuffing mixes  Salted crackers and rolls Pancake and waffle mixes Self-rising flour  Seasonings Accent    Meat sauces Barbecue sauce  Meat tenderizer Catsup    Monosodium glutamate (MSG) Celery salt   Onion  salt Chili  sauce   Prepared mustard Garlic salt   Salt, seasoned salt, sea salt Gravy mixes   Soy sauce Horseradish   Steak sauce Ketchup   Tartar sauce Lite salt    Teriyaki sauce Marinade mixes   Worcestershire sauce  Others Baking powder   Cocoa and cocoa mixes Baking soda   Commercial casserole mixes Candy-caramels, chocolate  Dehydrated soups    Bars, fudge,nougats  Instant rice and pasta mixes Canned broth or soup  Maraschino cherries Cheese, aged and processed cheese and cheese spreads  Learning Assessment Quiz  Indicated T (for True) or F (for False) for each of the following statements:  _____ Fresh fruits and vegetables and unprocessed grains are generally low in sodium _____ Water may contain a considerable amount of sodium, depending on the source _____ You can always tell if a food is high in sodium by tasting it _____ Certain laxatives my be high in sodium and should be avoided unless prescribed   by a physician or pharmacist _____ Salt substitutes may be used freely by anyone on a sodium restricted diet _____ Sodium is present in table salt, food additives and as a natural component of   most foods _____ Table salt is approximately 90% sodium _____ Limiting sodium intake may help prevent excess fluid accumulation in the body _____ On a sodium-restricted diet, seasonings such as bouillon soy sauce, and    cooking wine should be used in place of table salt _____ On an ingredient list, a product which lists monosodium glutamate as the first   ingredient is an appropriate food to include on a low sodium diet  Circle the best answer(s) to the following statements (Hint: there may be more than one correct answer)  11. On a low-sodium diet, some acceptable snack items are:    A. Olives  F. Bean dip   K. Grapefruit juice    B. Salted Pretzels G. Commercial Popcorn   L. Canned peaches    C. Carrot Sticks  H. Bouillon   M. Unsalted nuts   D. [place] fries  I. Peanut butter  crackers N. Salami   E. Sweet pickles J. Tomato Juice   O. Pizza  12.  Seasonings that may be used freely on a reduced - sodium diet include   A. Lemon wedges F.Monosodium glutamate K. Celery seed    B.Soysauce   G. Pepper   L. Mustard powder   C. Sea salt  H. Cooking wine  M. Onion flakes   D. Vinegar  E. Prepared horseradish N. Salsa   E. Sage   J. Worcestershire sauce  O. Chutney

## 2021-08-05 DIAGNOSIS — Z20822 Contact with and (suspected) exposure to covid-19: Secondary | ICD-10-CM | POA: Diagnosis not present

## 2021-08-19 DIAGNOSIS — Z23 Encounter for immunization: Secondary | ICD-10-CM | POA: Diagnosis not present

## 2021-09-15 DIAGNOSIS — I4891 Unspecified atrial fibrillation: Secondary | ICD-10-CM | POA: Diagnosis not present

## 2021-09-15 DIAGNOSIS — M109 Gout, unspecified: Secondary | ICD-10-CM | POA: Diagnosis not present

## 2021-09-15 DIAGNOSIS — E039 Hypothyroidism, unspecified: Secondary | ICD-10-CM | POA: Diagnosis not present

## 2021-09-15 DIAGNOSIS — E782 Mixed hyperlipidemia: Secondary | ICD-10-CM | POA: Diagnosis not present

## 2021-09-15 DIAGNOSIS — K219 Gastro-esophageal reflux disease without esophagitis: Secondary | ICD-10-CM | POA: Diagnosis not present

## 2021-09-15 DIAGNOSIS — R609 Edema, unspecified: Secondary | ICD-10-CM | POA: Diagnosis not present

## 2021-09-15 DIAGNOSIS — I10 Essential (primary) hypertension: Secondary | ICD-10-CM | POA: Diagnosis not present

## 2021-09-15 DIAGNOSIS — E538 Deficiency of other specified B group vitamins: Secondary | ICD-10-CM | POA: Diagnosis not present

## 2021-09-15 DIAGNOSIS — H409 Unspecified glaucoma: Secondary | ICD-10-CM | POA: Diagnosis not present

## 2021-09-15 DIAGNOSIS — N529 Male erectile dysfunction, unspecified: Secondary | ICD-10-CM | POA: Diagnosis not present

## 2021-09-15 DIAGNOSIS — R7303 Prediabetes: Secondary | ICD-10-CM | POA: Diagnosis not present

## 2021-09-15 DIAGNOSIS — L409 Psoriasis, unspecified: Secondary | ICD-10-CM | POA: Diagnosis not present

## 2021-09-20 DIAGNOSIS — H401113 Primary open-angle glaucoma, right eye, severe stage: Secondary | ICD-10-CM | POA: Diagnosis not present

## 2021-09-20 DIAGNOSIS — H0102B Squamous blepharitis left eye, upper and lower eyelids: Secondary | ICD-10-CM | POA: Diagnosis not present

## 2021-09-20 DIAGNOSIS — Z961 Presence of intraocular lens: Secondary | ICD-10-CM | POA: Diagnosis not present

## 2021-09-20 DIAGNOSIS — H401122 Primary open-angle glaucoma, left eye, moderate stage: Secondary | ICD-10-CM | POA: Diagnosis not present

## 2021-09-20 DIAGNOSIS — H0102A Squamous blepharitis right eye, upper and lower eyelids: Secondary | ICD-10-CM | POA: Diagnosis not present

## 2021-09-23 ENCOUNTER — Telehealth: Payer: Self-pay | Admitting: Cardiology

## 2021-09-23 NOTE — Telephone Encounter (Signed)
Patient called and mentioned that he is very interested in having sexual intercourse again. PCP told patient to see Dr. Johney Frame to see or to get approved on what to do or take. Please call back to discuss

## 2021-09-25 NOTE — Telephone Encounter (Signed)
Okay for him to engage in sexual intercourse. If he plans to use medications such as viagra, he just needs to be careful and watch his blood pressure as this can lower it quite significantly.

## 2021-09-26 ENCOUNTER — Telehealth: Payer: Self-pay | Admitting: Cardiology

## 2021-09-26 NOTE — Telephone Encounter (Signed)
Left the pt a message to call the office back to discuss recommendations per Dr. Johney Frame.

## 2021-09-26 NOTE — Telephone Encounter (Signed)
Pt aware of Dr. Jacolyn Reedy recommendations as indicated in this message. Pt verbalized understanding and agrees with this plan.

## 2021-09-26 NOTE — Telephone Encounter (Signed)
See previous open phone note about this encounter.

## 2021-09-26 NOTE — Telephone Encounter (Signed)
Patient is returning LPN's call regarding Dr. Jacolyn Reedy recommendations.

## 2021-11-14 DIAGNOSIS — D225 Melanocytic nevi of trunk: Secondary | ICD-10-CM | POA: Diagnosis not present

## 2021-11-14 DIAGNOSIS — Z85828 Personal history of other malignant neoplasm of skin: Secondary | ICD-10-CM | POA: Diagnosis not present

## 2021-11-14 DIAGNOSIS — Z08 Encounter for follow-up examination after completed treatment for malignant neoplasm: Secondary | ICD-10-CM | POA: Diagnosis not present

## 2021-11-14 DIAGNOSIS — L821 Other seborrheic keratosis: Secondary | ICD-10-CM | POA: Diagnosis not present

## 2021-11-14 DIAGNOSIS — L570 Actinic keratosis: Secondary | ICD-10-CM | POA: Diagnosis not present

## 2021-11-14 DIAGNOSIS — L814 Other melanin hyperpigmentation: Secondary | ICD-10-CM | POA: Diagnosis not present

## 2021-12-21 DIAGNOSIS — Z23 Encounter for immunization: Secondary | ICD-10-CM | POA: Diagnosis not present

## 2022-01-05 DIAGNOSIS — Z23 Encounter for immunization: Secondary | ICD-10-CM | POA: Diagnosis not present

## 2022-01-23 DIAGNOSIS — H401122 Primary open-angle glaucoma, left eye, moderate stage: Secondary | ICD-10-CM | POA: Diagnosis not present

## 2022-01-23 DIAGNOSIS — H0102A Squamous blepharitis right eye, upper and lower eyelids: Secondary | ICD-10-CM | POA: Diagnosis not present

## 2022-01-23 DIAGNOSIS — H0102B Squamous blepharitis left eye, upper and lower eyelids: Secondary | ICD-10-CM | POA: Diagnosis not present

## 2022-01-23 DIAGNOSIS — Z961 Presence of intraocular lens: Secondary | ICD-10-CM | POA: Diagnosis not present

## 2022-01-23 DIAGNOSIS — H401113 Primary open-angle glaucoma, right eye, severe stage: Secondary | ICD-10-CM | POA: Diagnosis not present

## 2022-02-20 ENCOUNTER — Other Ambulatory Visit: Payer: Self-pay

## 2022-02-20 DIAGNOSIS — H401122 Primary open-angle glaucoma, left eye, moderate stage: Secondary | ICD-10-CM | POA: Diagnosis not present

## 2022-02-20 DIAGNOSIS — Z961 Presence of intraocular lens: Secondary | ICD-10-CM | POA: Diagnosis not present

## 2022-02-20 DIAGNOSIS — H0102A Squamous blepharitis right eye, upper and lower eyelids: Secondary | ICD-10-CM | POA: Diagnosis not present

## 2022-02-20 DIAGNOSIS — H0102B Squamous blepharitis left eye, upper and lower eyelids: Secondary | ICD-10-CM | POA: Diagnosis not present

## 2022-02-20 DIAGNOSIS — H401113 Primary open-angle glaucoma, right eye, severe stage: Secondary | ICD-10-CM | POA: Diagnosis not present

## 2022-02-20 MED ORDER — LISINOPRIL 40 MG PO TABS: 40.0000 mg | ORAL_TABLET | Freq: Every day | ORAL | 1 refills | Status: DC

## 2022-03-01 ENCOUNTER — Telehealth: Payer: Self-pay | Admitting: Cardiology

## 2022-03-01 DIAGNOSIS — I483 Typical atrial flutter: Secondary | ICD-10-CM

## 2022-03-01 MED ORDER — APIXABAN 5 MG PO TABS: 5.0000 mg | ORAL_TABLET | Freq: Two times a day (BID) | ORAL | 1 refills | Status: DC

## 2022-03-01 NOTE — Telephone Encounter (Signed)
Eliquis '5mg'$  refill request received. Patient is 88 years old, weight-74.8kg, Crea-1.03 on 06/08/2021, Diagnosis-Afib, and last seen by Ermalinda Barrios on 06/08/2021. Dose is appropriate based on dosing criteria. Will send in refill to requested pharmacy.

## 2022-03-01 NOTE — Telephone Encounter (Signed)
*  STAT* If patient is at the pharmacy, call can be transferred to refill team.   1. Which medications need to be refilled? (please list name of each medication and dose if known) apixaban (ELIQUIS) 5 MG TABS tablet   2. Which pharmacy/location (including street and city if local pharmacy) is medication to be sent to?  [id number]- [place], [place] - [address]    3. Do they need a 30 day or 90 day supply? 90 day  Patient is completely out

## 2022-03-07 NOTE — Progress Notes (Unsigned)
Cardiology Office Note:    Date:  03/07/2022   ID:  Neil [name], DOB 03/28/1930, MRN [id number]  PCP:  Antony Contras, MD   [facility] Providers Cardiologist:  Freada Bergeron, MD     Referring MD: Antony Contras, MD    History of Present Illness:    Neil [name] is a 88 y.o. male with a hx of HTN, psoriasis (on enbrel), hypothyroidism, and glaucoma, and paroxysmal atrial fibrillation who was previously followed by Dr. Meda Coffee who now returns to clinic for follow-up of his Afib and HTN.  Per review of the record, patient was diagnosed with Afib in 10/2018. Echo showed normal LVEF, 55-60% and no significant valvular disease. He had his outpatient NST done on 7/17. This was a low risk study and showed no ischemia.   During last visit on 06/2021. There was concern for dehydration at that time. BP was also elevated and he was recommended for low Na diet. No medication changes were made.   Today, ***  Past Medical History:  Diagnosis Date   Glaucoma    Hypertension    Psoriasis     Past Surgical History:  Procedure Laterality Date   GLAUCOMA SURGERY Left    HERNIA REPAIR     HYDROCELE EXCISION / REPAIR      Current Medications: No outpatient medications have been marked as taking for the 03/08/22 encounter (Appointment) with Freada Bergeron, MD.     Allergies:   Patient has no known allergies.   Social History   Socioeconomic History   Marital status: Married    Spouse name: Not on file   Number of children: Not on file   Years of education: Not on file   Highest education level: Not on file  Occupational History   Not on file  Tobacco Use   Smoking status: Former    Types: Pipe    Passive exposure: Never   Smokeless tobacco: Never   Tobacco comments:    quit >40 years ago  Vaping Use   Vaping Use: Never used  Substance and Sexual Activity   Alcohol use: Yes    Alcohol/week: 7.0 standard drinks of alcohol    Types: 7 Standard drinks or  equivalent per week    Comment: 1 drink daily   Drug use: Never   Sexual activity: Not on file  Other Topics Concern   Not on file  Social History Narrative   Wife has alzheimer's   Social Determinants of Health   Financial Resource Strain: Not on file  Food Insecurity: Not on file  Transportation Needs: Not on file  Physical Activity: Not on file  Stress: Not on file  Social Connections: Not on file     Family History: The patient's family history includes High blood pressure in his mother and sister; Stroke in his mother.  ROS:   Please see the history of present illness.    Review of Systems  Constitutional:  Positive for malaise/fatigue. Negative for chills and fever.  HENT:  Positive for hearing loss.   Eyes:  Negative for pain.  Respiratory:  Negative for shortness of breath.   Cardiovascular:  Positive for leg swelling. Negative for chest pain, palpitations, orthopnea, claudication and PND.  Gastrointestinal:  Negative for blood in stool, melena, nausea and vomiting.  Genitourinary:  Negative for hematuria.  Musculoskeletal:  Positive for joint pain. Negative for falls.  Neurological:  Negative for dizziness and loss of consciousness.  Endo/Heme/Allergies:  Positive for  environmental allergies.  Psychiatric/Behavioral:  Negative for substance abuse.     EKGs/Labs/Other Studies Reviewed:    The following studies were reviewed today: Echocardiogram 10/10/2018: 1. The left ventricle has normal systolic function, with an ejection fraction of 55-60%. The cavity size was normal. There is moderate concentric left ventricular hypertrophy. Left ventricular diastolic function could not be evaluated secondary to  atrial fibrillation. No evidence of left ventricular regional wall motion abnormalities.  2. The right ventricle has normal systolic function. The cavity was mildly enlarged. There is no increase in right ventricular wall thickness. Right ventricular systolic pressure  is normal.  3. Right atrial size was moderately dilated.  4. The aortic valve is tricuspid. Mild thickening of the aortic valve. No stenosis of the aortic valve.  5. There is mild to moderate dilatation of the ascending aorta measuring 45 mm.   NST 10/25/18  Study Highlights     The left ventricular ejection fraction is hyperdynamic (>65%). Nuclear stress EF: 68%. No wall motion abnormalities. There was no ST segment deviation noted during stress. This is a low risk study. No ischemia.       EKG:  No new tracing today  Recent Labs: 06/08/2021: BUN 13; Creatinine, Ser 1.03; Potassium 4.2; Sodium 139  Recent Lipid Panel    Component Value Date/Time   CHOL 160 02/12/2019 1024   TRIG 68 02/12/2019 1024   HDL 48 02/12/2019 1024   CHOLHDL 3.3 02/12/2019 1024   CHOLHDL 4.2 10/10/2018 0426   VLDL 13 10/10/2018 0426   LDLCALC 99 02/12/2019 1024     Risk Assessment/Calculations:    CHA2DS2-VASc Score =    {This indicates a  % annual risk of stroke. The patient's score is based upon:       Physical Exam:    VS:  There were no vitals taken for this visit.    Wt Readings from Last 3 Encounters:  06/08/21 165 lb (74.8 kg)  08/31/20 171 lb 3.2 oz (77.7 kg)  02/18/20 174 lb (78.9 kg)     GEN: Elderly male, NAD HEENT: Normal NECK: No JVD; No carotid bruits CARDIAC: RRR, no murmurs, rubs, gallops RESPIRATORY:  Clear to auscultation without rales, wheezing or rhonchi  ABDOMEN: Soft, non-tender, non-distended MUSCULOSKELETAL:  No edema; No deformity  SKIN: Warm and dry NEUROLOGIC:  Alert and oriented x 3 PSYCHIATRIC:  Normal affect   ASSESSMENT:    No diagnosis found.  PLAN:    In order of problems listed above:  #Paroxysmal Afib: CHADs-vasc 3. On AC with apixaban BID without bleeding issues. Not requiring nodal agents. -Continue apixaban '5mg'$  BID -Not on nodal agents  #LE Edema: Chronic and very minimal on exam. TTE with normal LVEF, no significant valve  disease. Diastolic function indeterminant due to afib. -Continue compression socks and ankle pumps -Off HCTZ due to concern for dehydration  #HTN: -Continue lisinopril '40mg'$  daily -BMET with PCP next week -Monitor blood pressures 2x/day for [place] to review      Medication Adjustments/Labs and Tests Ordered: Current medicines are reviewed at length with the patient today.  Concerns regarding medicines are outlined above.  No orders of the defined types were placed in this encounter.  No orders of the defined types were placed in this encounter.   There are no Patient Instructions on file for this visit.    Signed, Freada Bergeron, MD  03/07/2022 1:22 PM    [place]

## 2022-03-08 ENCOUNTER — Encounter: Payer: Self-pay | Admitting: Cardiology

## 2022-03-08 ENCOUNTER — Encounter: Payer: Self-pay | Admitting: *Deleted

## 2022-03-08 ENCOUNTER — Ambulatory Visit: Payer: Medicare Other | Attending: Cardiology | Admitting: Cardiology

## 2022-03-08 VITALS — BP 155/92 | HR 62 | Ht 73.0 in | Wt 158.6 lb

## 2022-03-08 DIAGNOSIS — E785 Hyperlipidemia, unspecified: Secondary | ICD-10-CM

## 2022-03-08 DIAGNOSIS — R0609 Other forms of dyspnea: Secondary | ICD-10-CM

## 2022-03-08 DIAGNOSIS — I10 Essential (primary) hypertension: Secondary | ICD-10-CM

## 2022-03-08 DIAGNOSIS — R5383 Other fatigue: Secondary | ICD-10-CM

## 2022-03-08 DIAGNOSIS — I480 Paroxysmal atrial fibrillation: Secondary | ICD-10-CM

## 2022-03-08 DIAGNOSIS — R600 Localized edema: Secondary | ICD-10-CM

## 2022-03-08 MED ORDER — AMLODIPINE BESYLATE 5 MG PO TABS: 5.0000 mg | ORAL_TABLET | Freq: Every day | ORAL | 3 refills | Status: DC

## 2022-03-08 NOTE — Patient Instructions (Signed)
Medication Instructions:   START TAKING AMLODIPINE 5 MG BY MOUTH DAILY AT BEDTIME  *If you need a refill on your cardiac medications before your next appointment, please call your pharmacy*   Testing/Procedures:  Your physician has requested that you have an echocardiogram. Echocardiography is a painless test that uses sound waves to create images of your heart. It provides your doctor with information about the size and shape of your heart and how well your heart's chambers and valves are working. This procedure takes approximately one hour. There are no restrictions for this procedure. Please do NOT wear cologne, perfume, aftershave, or lotions (deodorant is allowed). Please arrive 15 minutes prior to your appointment time.   Your physician has requested that you have a lexiscan myoview. For further information please visit [email or web address]. Please follow instruction sheet, as given.    Follow-Up:  3 MONTHS WITH AN EXTENDER IN THE OFFICE--PLEASE SCHEDULE WITH AN EXTENDER

## 2022-03-08 NOTE — Progress Notes (Signed)
Cardiology Office Note:    Date:  03/08/2022   ID:  [name], DOB [birth year], MRN [id number]  PCP:  Antony Contras, MD   [facility] Providers Cardiologist:  Freada Bergeron, MD     Referring MD: Antony Contras, MD    History of Present Illness:    Neil [name] is a 88 y.o. male with a hx of HTN, psoriasis (on enbrel), hypothyroidism, and glaucoma, and paroxysmal atrial fibrillation who was previously followed by Dr. Meda Coffee who now returns to clinic for follow-up of his Afib and HTN.  Per review of the record, patient was diagnosed with Afib in 10/2018. Echo showed normal LVEF, 55-60% and no significant valvular disease. He had his outpatient NST done on 7/17. This was a low risk study and showed no ischemia.   During last visit on 06/2021, there was concern for dehydration at that time. BP was also elevated and he was recommended for low Na diet. No medication changes were made.   Today, he states that he has had some issues with fatigue and lightheadedness which he attributes to either his Synthroid or Lisinopril. He states that he does not wake up feeling tired but feels severely fatigued as the day goes on. He is unable to be as active due to this. He is walking with a cane when he is able to walk.  He feels that his lightheadedness starts around noon and may occur both with sitting and positional changes. He denies any balance issues or falls. He notes having 1 episode while driving his car wherein he felt near syncopal and had to pull over. This happened a second time while walking in a parking lot to his car on a hot day. He notes that is very poorly hydrated and knows that he does not drink as much as he should.  His BP on arrival today is 172/83 despite compliance with his Lisinopril '40mg'$  daily. He states that this dose was doubled since his last visit. His blood pressure has been running between 382-505L systolic at home.  He reports losing ~20lbs.  He notes  having some mild BLE L>R leg swelling.  He denies any chest pain, palpitations, shortness of breath, or orthopnea.  He has questions as to whether or not he should use Sildenafil as he has experienced increased eye pressure when he did use this. The pressure resolved with discontinuation of the medication.  Past Medical History:  Diagnosis Date   Glaucoma    Hypertension    Psoriasis     Past Surgical History:  Procedure Laterality Date   GLAUCOMA SURGERY Left    HERNIA REPAIR     HYDROCELE EXCISION / REPAIR      Current Medications: Current Meds  Medication Sig   amLODipine (NORVASC) 5 MG tablet Take 1 tablet (5 mg total) by mouth at bedtime.   apixaban (ELIQUIS) 5 MG TABS tablet Take 1 tablet (5 mg total) by mouth 2 (two) times daily.   bimatoprost (LUMIGAN) 0.01 % SOLN Place 1 drop into the right eye at bedtime.   brimonidine-timolol (COMBIGAN) 0.2-0.5 % ophthalmic solution Place 1 drop into the right eye 2 (two) times a day.   dorzolamide (TRUSOPT) 2 % ophthalmic solution Place 1 drop into the right eye 2 (two) times daily.   levothyroxine (SYNTHROID) 75 MCG tablet Take 75 mcg by mouth daily before breakfast.   lisinopril (ZESTRIL) 40 MG tablet Take 1 tablet (40 mg total) by mouth daily.   Multiple  Vitamin (MULTIVITAMIN ADULT PO) Take 1 tablet by mouth daily.   omeprazole (PRILOSEC) 40 MG capsule Take 40 mg by mouth daily.   vitamin B-12 (CYANOCOBALAMIN) 1000 MCG tablet Take 1,000 mcg by mouth daily.     Allergies:   Patient has no known allergies.   Social History   Socioeconomic History   Marital status: Married    Spouse name: Not on file   Number of children: Not on file   Years of education: Not on file   Highest education level: Not on file  Occupational History   Not on file  Tobacco Use   Smoking status: Former    Types: Pipe    Passive exposure: Never   Smokeless tobacco: Never   Tobacco comments:    quit >40 years ago  Vaping Use   Vaping Use:  Never used  Substance and Sexual Activity   Alcohol use: Yes    Alcohol/week: 7.0 standard drinks of alcohol    Types: 7 Standard drinks or equivalent per week    Comment: 1 drink daily   Drug use: Never   Sexual activity: Not on file  Other Topics Concern   Not on file  Social History Narrative   Wife has alzheimer's   Social Determinants of Health   Financial Resource Strain: Not on file  Food Insecurity: Not on file  Transportation Needs: Not on file  Physical Activity: Not on file  Stress: Not on file  Social Connections: Not on file     Family History: The patient's family history includes High blood pressure in his mother and sister; Stroke in his mother.  ROS:   Please see the history of present illness.    Review of Systems  Constitutional:  Positive for malaise/fatigue. Negative for chills and fever.  HENT:  Positive for tinnitus. Negative for nosebleeds.   Eyes:  Negative for blurred vision and pain.  Respiratory:  Negative for cough, hemoptysis, shortness of breath and stridor.   Cardiovascular:  Positive for leg swelling. Negative for chest pain, palpitations, orthopnea, claudication and PND.  Gastrointestinal:  Negative for blood in stool, diarrhea, nausea and vomiting.  Genitourinary:  Negative for dysuria and hematuria.  Musculoskeletal:  Negative for falls.  Neurological:  Negative for loss of consciousness and headaches.       + lightheadedness  Psychiatric/Behavioral:  Negative for depression, hallucinations and substance abuse. The patient does not have insomnia.     EKGs/Labs/Other Studies Reviewed:    The following studies were reviewed today: Echocardiogram 10/10/2018: 1. The left ventricle has normal systolic function, with an ejection fraction of 55-60%. The cavity size was normal. There is moderate concentric left ventricular hypertrophy. Left ventricular diastolic function could not be evaluated secondary to  atrial fibrillation. No evidence of  left ventricular regional wall motion abnormalities.  2. The right ventricle has normal systolic function. The cavity was mildly enlarged. There is no increase in right ventricular wall thickness. Right ventricular systolic pressure is normal.  3. Right atrial size was moderately dilated.  4. The aortic valve is tricuspid. Mild thickening of the aortic valve. No stenosis of the aortic valve.  5. There is mild to moderate dilatation of the ascending aorta measuring 45 mm.   NST 10/25/18  Study Highlights     The left ventricular ejection fraction is hyperdynamic (>65%). Nuclear stress EF: 68%. No wall motion abnormalities. There was no ST segment deviation noted during stress. This is a low risk study. No ischemia.  EKG:  No new tracing today.  Recent Labs: 06/08/2021: BUN 13; Creatinine, Ser 1.03; Potassium 4.2; Sodium 139  Recent Lipid Panel    Component Value Date/Time   CHOL 160 02/12/2019 1024   TRIG 68 02/12/2019 1024   HDL 48 02/12/2019 1024   CHOLHDL 3.3 02/12/2019 1024   CHOLHDL 4.2 10/10/2018 0426   VLDL 13 10/10/2018 0426   LDLCALC 99 02/12/2019 1024     Risk Assessment/Calculations:    CHA2DS2-VASc Score =    {This indicates a  % annual risk of stroke. The patient's score is based upon:       Physical Exam:    VS:  BP (!) 155/92 (BP Location: Right Arm, Patient Position: Sitting)   Pulse 62   Ht '6\' 1"'$  (1.854 m)   Wt 158 lb 9.6 oz (71.9 kg)   SpO2 98%   BMI 20.92 kg/m     Wt Readings from Last 3 Encounters:  03/08/22 158 lb 9.6 oz (71.9 kg)  06/08/21 165 lb (74.8 kg)  08/31/20 171 lb 3.2 oz (77.7 kg)     GEN: Elderly male, NAD HEENT: Normal NECK: No JVD; No carotid bruits CARDIAC: RRR, no murmurs, rubs, gallops RESPIRATORY:  Clear to auscultation without rales, wheezing or rhonchi  ABDOMEN: Soft, non-tender, non-distended MUSCULOSKELETAL:  No edema; No deformity  SKIN: Warm and dry NEUROLOGIC:  Alert and oriented x 3 PSYCHIATRIC:   Normal affect   ASSESSMENT:    1. DOE (dyspnea on exertion)   2. Essential hypertension   3. Paroxysmal atrial fibrillation (HCC)   4. Lower extremity edema   5. Hyperlipidemia, unspecified hyperlipidemia type   6. Fatigue, unspecified type     PLAN:    In order of problems listed above:  #Fatigue: #DOE: Unclear etiology. ? Related to uncontrolled HTN. Given symptoms are worsening, will check TTE and myoview for further evaluation. Will manage BP as below. -Check TTE -Check myoview  #Paroxysmal Afib: CHADs-vasc 3. On AC with apixaban BID without bleeding issues. Not requiring nodal agents. -Continue apixaban '5mg'$  BID -Not on nodal agents  #LE Edema: Chronic and very minimal on exam. TTE with normal LVEF, no significant valve disease. Diastolic function indeterminant due to afib. -Continue compression socks and ankle pumps -Off HCTZ due to concern for dehydration  #HTN: Elevated 140-170s at home. Has orthostatic symptoms but suspect this is related to dehydration. Given the degree of HTN, will add amlodipine at night. If dizziness worsens, will plan to stop. Discussed importance of hydration. -Start amlodipine '5mg'$  qHS -Continue lisinopril '40mg'$  daily -Follow-up with APP in 3 months to ensure BP better controlled      Medication Adjustments/Labs and Tests Ordered: Current medicines are reviewed at length with the patient today.  Concerns regarding medicines are outlined above.  Orders Placed This Encounter  Procedures   Cardiac Stress Test: Informed Consent Details: Physician/Practitioner Attestation; Transcribe to consent form and obtain patient signature   MYOCARDIAL PERFUSION IMAGING   ECHOCARDIOGRAM COMPLETE   Meds ordered this encounter  Medications   amLODipine (NORVASC) 5 MG tablet    Sig: Take 1 tablet (5 mg total) by mouth at bedtime.    Dispense:  90 tablet    Refill:  3    Patient Instructions  Medication Instructions:   START TAKING AMLODIPINE 5 MG  BY MOUTH DAILY AT BEDTIME  *If you need a refill on your cardiac medications before your next appointment, please call your pharmacy*   Testing/Procedures:  Your physician has requested that  you have an echocardiogram. Echocardiography is a painless test that uses sound waves to create images of your heart. It provides your doctor with information about the size and shape of your heart and how well your heart's chambers and valves are working. This procedure takes approximately one hour. There are no restrictions for this procedure. Please do NOT wear cologne, perfume, aftershave, or lotions (deodorant is allowed). Please arrive 15 minutes prior to your appointment time.   Your physician has requested that you have a lexiscan myoview. For further information please visit [email or web address]. Please follow instruction sheet, as given.    Follow-Up:  3 MONTHS WITH AN EXTENDER IN THE OFFICE--PLEASE SCHEDULE WITH AN EXTENDER            I,Alexis Herring,acting as a scribe for Freada Bergeron, MD.,have documented all relevant documentation on the behalf of Freada Bergeron, MD,as directed by  Freada Bergeron, MD while in the presence of Freada Bergeron, MD.  I, Freada Bergeron, MD, have reviewed all documentation for this visit. The documentation on 03/08/22 for the exam, diagnosis, procedures, and orders are all accurate and complete.   Signed, Freada Bergeron, MD  03/08/2022 2:28 PM    [place]

## 2022-03-11 ENCOUNTER — Telehealth: Payer: Self-pay | Admitting: Home Health

## 2022-03-11 NOTE — Telephone Encounter (Signed)
Patient called after hour line, reporting that he had dizzy spell this morning. He checked his BP: 128/65 , 109/57  ,134/62, 121/61. He believes when BP was 109/57, he felt dizzy. Advised patient to lower amlodipine from '5mg'$  to 2.'5mg'$  daily starting tonight, continue trend BP at home, call back if further questions.

## 2022-03-13 ENCOUNTER — Telehealth: Payer: Self-pay | Admitting: Cardiology

## 2022-03-13 DIAGNOSIS — H0102B Squamous blepharitis left eye, upper and lower eyelids: Secondary | ICD-10-CM | POA: Diagnosis not present

## 2022-03-13 DIAGNOSIS — Z961 Presence of intraocular lens: Secondary | ICD-10-CM | POA: Diagnosis not present

## 2022-03-13 DIAGNOSIS — H401113 Primary open-angle glaucoma, right eye, severe stage: Secondary | ICD-10-CM | POA: Diagnosis not present

## 2022-03-13 DIAGNOSIS — H401122 Primary open-angle glaucoma, left eye, moderate stage: Secondary | ICD-10-CM | POA: Diagnosis not present

## 2022-03-13 DIAGNOSIS — H0102A Squamous blepharitis right eye, upper and lower eyelids: Secondary | ICD-10-CM | POA: Diagnosis not present

## 2022-03-13 DIAGNOSIS — Z9889 Other specified postprocedural states: Secondary | ICD-10-CM | POA: Diagnosis not present

## 2022-03-13 NOTE — Telephone Encounter (Signed)
Pt c/o medication issue:  1. Name of Medication:   amLODipine (NORVASC) 5 MG tablet   2. How are you currently taking this medication (dosage and times per day)? As prescribed  3. Are you having a reaction (difficulty breathing--STAT)? No  4. What is your medication issue?   Patient stated he spoke with someone on Saturday and they told him to cut the pill in half but he is having trouble cutting the medication in half and would like to know if he should continue taking this medication.  Patient stated he has not taken today's regular medications as yet. Patient reported his BP readings for today are: 125/65 111/63 136/63 125/57

## 2022-03-13 NOTE — Telephone Encounter (Signed)
Pt is calling to ask if he should continue taking amlodipine 5 mg po daily at bedtime.  This was started at last OV with Dr. Johney Frame on 11/29.  He states his pressures are running as low as 115/54.   He reports he is taking this at bedtime.   Advised the pt that he needs to continue this regimen and all his other cardiac meds.   Went over BP parameters that would warrant him to hold his amlodipine and notify us.   Advised him to keep his test as scheduled and follow-up with Nicholes Rough PA-C as scheduled.   Pt verbalized understanding and agrees with this plan.  Will send this message to Dr. Johney Frame as a general FYI.

## 2022-03-23 ENCOUNTER — Telehealth (HOSPITAL_COMMUNITY): Payer: Self-pay | Admitting: *Deleted

## 2022-03-23 NOTE — Telephone Encounter (Signed)
Per DPR left detailed message on daughters cell phone for stress test scheduled on 03/30/22.

## 2022-03-30 ENCOUNTER — Ambulatory Visit (HOSPITAL_COMMUNITY): Payer: Medicare Other | Attending: Cardiology

## 2022-03-30 ENCOUNTER — Ambulatory Visit (HOSPITAL_BASED_OUTPATIENT_CLINIC_OR_DEPARTMENT_OTHER): Payer: Medicare Other

## 2022-03-30 DIAGNOSIS — R0609 Other forms of dyspnea: Secondary | ICD-10-CM | POA: Insufficient documentation

## 2022-03-30 DIAGNOSIS — I10 Essential (primary) hypertension: Secondary | ICD-10-CM | POA: Diagnosis not present

## 2022-03-30 LAB — MYOCARDIAL PERFUSION IMAGING
LV dias vol: 76 mL (ref 62–150)
LV sys vol: 37 mL
Nuc Stress EF: 52 %
Peak HR: 84 {beats}/min
Rest HR: 65 {beats}/min
Rest Nuclear Isotope Dose: 11 mCi
SDS: 0
SRS: 0
SSS: 0
ST Depression (mm): 0 mm
Stress Nuclear Isotope Dose: 31.3 mCi
TID: 1.1

## 2022-03-30 LAB — ECHOCARDIOGRAM COMPLETE
Area-P 1/2: 2.83 cm2
Height: 73 in
P 1/2 time: 344 msec
S' Lateral: 2.9 cm
Weight: 2528 oz

## 2022-03-30 MED ORDER — REGADENOSON 0.4 MG/5ML IV SOLN
0.4000 mg | Freq: Once | INTRAVENOUS | Status: AC
  Administered 2022-03-30: 0.4 mg via INTRAVENOUS

## 2022-03-30 MED ORDER — TECHNETIUM TC 99M TETROFOSMIN IV KIT
31.3000 | PACK | Freq: Once | INTRAVENOUS | Status: AC | PRN
  Administered 2022-03-30: 31.3 via INTRAVENOUS

## 2022-03-30 MED ORDER — TECHNETIUM TC 99M TETROFOSMIN IV KIT
11.0000 | PACK | Freq: Once | INTRAVENOUS | Status: AC | PRN
  Administered 2022-03-30: 11 via INTRAVENOUS

## 2022-03-31 ENCOUNTER — Telehealth: Payer: Self-pay | Admitting: *Deleted

## 2022-03-31 ENCOUNTER — Telehealth: Payer: Self-pay | Admitting: Cardiology

## 2022-03-31 DIAGNOSIS — I071 Rheumatic tricuspid insufficiency: Secondary | ICD-10-CM

## 2022-03-31 DIAGNOSIS — I77819 Aortic ectasia, unspecified site: Secondary | ICD-10-CM

## 2022-03-31 DIAGNOSIS — I340 Nonrheumatic mitral (valve) insufficiency: Secondary | ICD-10-CM

## 2022-03-31 NOTE — Telephone Encounter (Signed)
The patient has been notified of the result and verbalized understanding.  All questions (if any) were answered.  Pt aware to maintain good BP control, watch his salt intake, and take his medications.  He is aware that he will need a repeat echo in one year for surveillance of his aorta.  Pt aware I will go ahead and place the order for repeat echo in one year in the system and send a message to our Echo Scheduler to call him back and arrange this appt for that time.   Pt verbalized understanding and agrees with this plan.

## 2022-03-31 NOTE — Telephone Encounter (Signed)
Pt returning call for echo results  

## 2022-03-31 NOTE — Telephone Encounter (Signed)
-----   Message from Nuala Alpha, LPN sent at 29/56/2130  7:23 AM EST -----  ----- Message ----- From: Freada Bergeron, MD Sent: 03/30/2022   4:40 PM EST To: [facility] Triage  His echo shows normal pumping function. There is mild leakiness of the mitral valve, mild to moderate leakiness of the tricuspid valve. There is also mildly elevated blood pressures in the arteries in the lungs. Overall, this should not cause significant symptoms. We will continue to monitor his echo yearly as his aorta was mild dilated. We just need to ensure his blood pressure is controlled.

## 2022-03-31 NOTE — Telephone Encounter (Signed)
The patient has been notified of the result and verbalized understanding.  All questions (if any) were answered.   Pt aware to maintain good BP control, watch his salt intake, and take his medications.   He is aware that he will need a repeat echo in one year for surveillance of his aorta.   Pt aware I will go ahead and place the order for repeat echo in one year in the system and send a message to our Echo Scheduler to call him back and arrange this appt for that time.    Pt verbalized understanding and agrees with this plan.             03/31/22  9:34 AM You routed this conversation to Freada Bergeron, MD Me      03/31/22  9:25 AM Note ----- Message from Nuala Alpha, LPN sent at 63/84/6659  7:23 AM EST -----   ----- Message ----- From: Freada Bergeron, MD Sent: 03/30/2022   4:40 PM EST To: Cv Div [facility] Triage   His echo shows normal pumping function. There is mild leakiness of the mitral valve, mild to moderate leakiness of the tricuspid valve. There is also mildly elevated blood pressures in the arteries in the lungs. Overall, this should not cause significant symptoms. We will continue to monitor his echo yearly as his aorta was mild dilated. We just need to ensure his blood pressure is controlled

## 2022-05-09 DIAGNOSIS — E039 Hypothyroidism, unspecified: Secondary | ICD-10-CM | POA: Diagnosis not present

## 2022-05-09 DIAGNOSIS — E538 Deficiency of other specified B group vitamins: Secondary | ICD-10-CM | POA: Diagnosis not present

## 2022-05-09 DIAGNOSIS — K219 Gastro-esophageal reflux disease without esophagitis: Secondary | ICD-10-CM | POA: Diagnosis not present

## 2022-05-09 DIAGNOSIS — D6869 Other thrombophilia: Secondary | ICD-10-CM | POA: Diagnosis not present

## 2022-05-09 DIAGNOSIS — I10 Essential (primary) hypertension: Secondary | ICD-10-CM | POA: Diagnosis not present

## 2022-05-09 DIAGNOSIS — R609 Edema, unspecified: Secondary | ICD-10-CM | POA: Diagnosis not present

## 2022-05-09 DIAGNOSIS — M109 Gout, unspecified: Secondary | ICD-10-CM | POA: Diagnosis not present

## 2022-05-09 DIAGNOSIS — Z23 Encounter for immunization: Secondary | ICD-10-CM | POA: Diagnosis not present

## 2022-05-09 DIAGNOSIS — I4891 Unspecified atrial fibrillation: Secondary | ICD-10-CM | POA: Diagnosis not present

## 2022-05-09 DIAGNOSIS — E782 Mixed hyperlipidemia: Secondary | ICD-10-CM | POA: Diagnosis not present

## 2022-05-09 DIAGNOSIS — R7303 Prediabetes: Secondary | ICD-10-CM | POA: Diagnosis not present

## 2022-05-09 DIAGNOSIS — Z0000 Encounter for general adult medical examination without abnormal findings: Secondary | ICD-10-CM | POA: Diagnosis not present

## 2022-05-09 DIAGNOSIS — Z1331 Encounter for screening for depression: Secondary | ICD-10-CM | POA: Diagnosis not present

## 2022-05-09 LAB — HEMOGLOBIN A1C: A1c: 5.6

## 2022-05-09 LAB — CMP 10231: EGFR (Non-African Amer.): 77

## 2022-05-17 DIAGNOSIS — L570 Actinic keratosis: Secondary | ICD-10-CM | POA: Diagnosis not present

## 2022-05-17 DIAGNOSIS — Z85828 Personal history of other malignant neoplasm of skin: Secondary | ICD-10-CM | POA: Diagnosis not present

## 2022-05-17 DIAGNOSIS — D225 Melanocytic nevi of trunk: Secondary | ICD-10-CM | POA: Diagnosis not present

## 2022-05-17 DIAGNOSIS — L821 Other seborrheic keratosis: Secondary | ICD-10-CM | POA: Diagnosis not present

## 2022-05-17 DIAGNOSIS — L814 Other melanin hyperpigmentation: Secondary | ICD-10-CM | POA: Diagnosis not present

## 2022-05-17 DIAGNOSIS — Z08 Encounter for follow-up examination after completed treatment for malignant neoplasm: Secondary | ICD-10-CM | POA: Diagnosis not present

## 2022-05-23 DIAGNOSIS — H401113 Primary open-angle glaucoma, right eye, severe stage: Secondary | ICD-10-CM | POA: Diagnosis not present

## 2022-05-23 DIAGNOSIS — Z961 Presence of intraocular lens: Secondary | ICD-10-CM | POA: Diagnosis not present

## 2022-05-23 DIAGNOSIS — H0102B Squamous blepharitis left eye, upper and lower eyelids: Secondary | ICD-10-CM | POA: Diagnosis not present

## 2022-05-23 DIAGNOSIS — H0102A Squamous blepharitis right eye, upper and lower eyelids: Secondary | ICD-10-CM | POA: Diagnosis not present

## 2022-05-23 DIAGNOSIS — H401122 Primary open-angle glaucoma, left eye, moderate stage: Secondary | ICD-10-CM | POA: Diagnosis not present

## 2022-05-23 DIAGNOSIS — H532 Diplopia: Secondary | ICD-10-CM | POA: Diagnosis not present

## 2022-06-07 ENCOUNTER — Ambulatory Visit: Payer: Medicare Other | Admitting: Physician Assistant

## 2022-06-07 NOTE — Progress Notes (Deleted)
Office Visit    Patient Name: Neil [name] Date of Encounter: 06/07/2022  PCP:  Antony Contras, MD   [facility]  Cardiologist:  Freada Bergeron, MD  Advanced Practice Provider:  No care team member to display Electrophysiologist:  None   HPI    Neil [name] is a 89 y.o. male with a past medical history of hypertension, psoriasis, hypothyroidism, paroxysmal atrial fibrillation and glaucoma presents today for follow-up visit.  He was last seen by Dr. Johney Frame 11/23 for follow-up visit of his atrial fibrillation hypertension.  Per record, patient was diagnosed with A-fib 10/2018.  Echo showed LVEF 55 to 60% no significant valvular disease.  Had outpatient NST done 7/17 and he was low risk for ischemia.  During his last visit 06/2021 there was concern for dehydration at that time.  BP was also elevated and it was recommended for low-sodium diet.  No medication changes were made.  When he was last seen 02/2022 he stated he was having some issues with fatigue and lightheadedness which he attributed to either his Synthroid or lisinopril.  He stated that he did not wake up feeling tired but felt severely fatigued as the day goes on.  He was unable to be as active due to this.  He does walk with a cane when he is able to walk.  He also endorsed some lightheadedness that starts around noon and may occur with sitting or positional changes.  He denied balance issues or falls.  He had 1 episode while driving his car where he felt near syncopal and had to pull over.  Happened a second time while walking in a parking lot on a hot day.  Hydration was discussed.  BP on arrival then was at 172/83 despite compliance with medication (lisinopril 40 mg daily).  Dose was doubled since his last visit.  Typically systolic blood pressure at home running 1 40-1 70s.  Reported losing about 20 pounds.  Did have some lower extremity edema left greater than right.  Today, he***  Past  Medical History    Past Medical History:  Diagnosis Date   Glaucoma    Hypertension    Psoriasis    Past Surgical History:  Procedure Laterality Date   GLAUCOMA SURGERY Left    HERNIA REPAIR     HYDROCELE EXCISION / REPAIR      Allergies  No Known Allergies   EKGs/Labs/Other Studies Reviewed:   The following studies were reviewed today:  Echocardiogram 10/10/2018: 1. The left ventricle has normal systolic function, with an ejection fraction of 55-60%. The cavity size was normal. There is moderate concentric left ventricular hypertrophy. Left ventricular diastolic function could not be evaluated secondary to  atrial fibrillation. No evidence of left ventricular regional wall motion abnormalities.  2. The right ventricle has normal systolic function. The cavity was mildly enlarged. There is no increase in right ventricular wall thickness. Right ventricular systolic pressure is normal.  3. Right atrial size was moderately dilated.  4. The aortic valve is tricuspid. Mild thickening of the aortic valve. No stenosis of the aortic valve.  5. There is mild to moderate dilatation of the ascending aorta measuring 45 mm.   NST 10/25/18  Study Highlights     The left ventricular ejection fraction is hyperdynamic (>65%). Nuclear stress EF: 68%. No wall motion abnormalities. There was no ST segment deviation noted during stress. This is a low risk study. No ischemia.  EKG:  EKG is *** ordered today.  The ekg ordered today demonstrates ***  Recent Labs: 06/08/2021: BUN 13; Creatinine, Ser 1.03; Potassium 4.2; Sodium 139  Recent Lipid Panel    Component Value Date/Time   CHOL 160 02/12/2019 1024   TRIG 68 02/12/2019 1024   HDL 48 02/12/2019 1024   CHOLHDL 3.3 02/12/2019 1024   CHOLHDL 4.2 10/10/2018 0426   VLDL 13 10/10/2018 0426   LDLCALC 99 02/12/2019 1024    Risk Assessment/Calculations:  {Does this patient have ATRIAL FIBRILLATION?:[phone]}  Home Medications    No outpatient medications have been marked as taking for the 06/07/22 encounter (Appointment) with Elgie Collard, PA-C.     Review of Systems   ***   All other systems reviewed and are otherwise negative except as noted above.  Physical Exam    VS:  There were no vitals taken for this visit. , BMI There is no height or weight on file to calculate BMI.  Wt Readings from Last 3 Encounters:  03/30/22 158 lb (71.7 kg)  03/08/22 158 lb 9.6 oz (71.9 kg)  06/08/21 165 lb (74.8 kg)     GEN: Well nourished, well developed, in no acute distress. HEENT: normal. Neck: Supple, no JVD, carotid bruits, or masses. Cardiac: ***RRR, no murmurs, rubs, or gallops. No clubbing, cyanosis, edema.  ***Radials/PT 2+ and equal bilaterally.  Respiratory:  ***Respirations regular and unlabored, clear to auscultation bilaterally. GI: Soft, nontender, nondistended. MS: No deformity or atrophy. Skin: Warm and dry, no rash. Neuro:  Strength and sensation are intact. Psych: Normal affect.  Assessment & Plan    DOE Hypertension Paroxysmal atrial fibrillation Lower extremity edema Hyperlipidemia Fatigue Mild MR  No BP recorded.  {Refresh Note OR Click here to enter BP  :1}***      Disposition: Follow up {follow up:15908} with Freada Bergeron, MD or APP.  Signed, Elgie Collard, PA-C 06/07/2022, 10:57 AM [place]

## 2022-07-05 DIAGNOSIS — H0102A Squamous blepharitis right eye, upper and lower eyelids: Secondary | ICD-10-CM | POA: Diagnosis not present

## 2022-07-05 DIAGNOSIS — Z961 Presence of intraocular lens: Secondary | ICD-10-CM | POA: Diagnosis not present

## 2022-07-05 DIAGNOSIS — H532 Diplopia: Secondary | ICD-10-CM | POA: Diagnosis not present

## 2022-07-05 DIAGNOSIS — H401113 Primary open-angle glaucoma, right eye, severe stage: Secondary | ICD-10-CM | POA: Diagnosis not present

## 2022-07-05 DIAGNOSIS — H401122 Primary open-angle glaucoma, left eye, moderate stage: Secondary | ICD-10-CM | POA: Diagnosis not present

## 2022-07-05 DIAGNOSIS — H0102B Squamous blepharitis left eye, upper and lower eyelids: Secondary | ICD-10-CM | POA: Diagnosis not present

## 2022-07-19 ENCOUNTER — Ambulatory Visit: Payer: Medicare Other | Attending: Physician Assistant | Admitting: Nurse Practitioner

## 2022-07-19 ENCOUNTER — Encounter: Payer: Self-pay | Admitting: Nurse Practitioner

## 2022-07-19 VITALS — BP 110/62 | HR 61 | Ht 73.0 in | Wt 161.6 lb

## 2022-07-19 DIAGNOSIS — R42 Dizziness and giddiness: Secondary | ICD-10-CM

## 2022-07-19 DIAGNOSIS — I340 Nonrheumatic mitral (valve) insufficiency: Secondary | ICD-10-CM | POA: Diagnosis not present

## 2022-07-19 DIAGNOSIS — Z7901 Long term (current) use of anticoagulants: Secondary | ICD-10-CM | POA: Diagnosis not present

## 2022-07-19 DIAGNOSIS — I071 Rheumatic tricuspid insufficiency: Secondary | ICD-10-CM

## 2022-07-19 DIAGNOSIS — I10 Essential (primary) hypertension: Secondary | ICD-10-CM | POA: Diagnosis not present

## 2022-07-19 DIAGNOSIS — I480 Paroxysmal atrial fibrillation: Secondary | ICD-10-CM | POA: Diagnosis not present

## 2022-07-19 DIAGNOSIS — I77819 Aortic ectasia, unspecified site: Secondary | ICD-10-CM | POA: Diagnosis not present

## 2022-07-19 MED ORDER — LISINOPRIL 40 MG PO TABS: 20.0000 mg | ORAL_TABLET | Freq: Two times a day (BID) | ORAL | 1 refills | Status: DC

## 2022-07-19 MED ORDER — AMLODIPINE BESYLATE 5 MG PO TABS: 2.5000 mg | ORAL_TABLET | Freq: Two times a day (BID) | ORAL | 3 refills | Status: DC

## 2022-07-19 NOTE — Patient Instructions (Signed)
Medication Instructions:   CHANGE Amlodipine one half (0.5) tablet by mouth ( 2.5 mg) twice daily.   CHANGE Lisinopril one half ( 0.5) tablet by mouth (20 mg) twice daily.   *If you need a refill on your cardiac medications before your next appointment, please call your pharmacy*   Lab Work:  None ordered.  If you have labs (blood work) drawn today and your tests are completely normal, you will receive your results only by: MyChart Message (if you have MyChart) OR A paper copy in the mail If you have any lab test that is abnormal or we need to change your treatment, we will call you to review the results.   Testing/Procedures:  None ordered.   Follow-Up: At [facility], you and your health needs are our priority.  As part of our continuing mission to provide you with exceptional heart care, we have created designated Provider Care Teams.  These Care Teams include your primary Cardiologist (physician) and Advanced Practice Providers (APPs -  Physician Assistants and Nurse Practitioners) who all work together to provide you with the care you need, when you need it.  We recommend signing up for the patient portal called "MyChart".  Sign up information is provided on this After Visit Summary.  MyChart is used to connect with patients for Virtual Visits (Telemedicine).  Patients are able to view lab/test results, encounter notes, upcoming appointments, etc.  Non-urgent messages can be sent to your provider as well.   To learn more about what you can do with MyChart, go to [email or web address].    Your next appointment:   3 month(s)  Provider:   Meriam Sprague, MD

## 2022-07-19 NOTE — Progress Notes (Signed)
Cardiology Office Note:    Date:  07/19/2022   ID:  Neil [name], DOB [birth year], MRN [id number]  PCP:  Tally Joe, MD   [facility] Providers Cardiologist:  Meriam Sprague, MD     Referring MD: Tally Joe, MD   Chief Complaint: hypertension  History of Present Illness:    Neil [name] is a very plesant 89 y.o. male with a hx of HTN, hypothyroidism, glaucoma, PAF on chronic anticoagulation, and psoriasis.  Diffusely followed by Dr. Delton See, he established with Dr. Shari Prows 08/2020.  He was diagnosed with A-fib 10/2018.  Echo showed normal LVEF 55 to 60% and no significant valvular disease.  He had outpatient NST 10/2018 which was a low risk study with no ischemia.  During clinic visit 06/2021 there was concern for dehydration.  BP was elevated and he was recommended to follow low-sodium diet.  Medication changes were made.  Last cardiology clinic visit was 03/08/2022 with Dr. Shari Prows.  He reported fatigue and lightheadedness which she attributes to either his Synthroid or lisinopril.  He does not wake up feeling tired but feels severely fatigued as the day goes on.  He is unable to be as active due to this fatigue.  No falls.  He noted 1 episode while driving his car where he felt near syncopal and had to pull over.  This happened a second time while parking his car on a hot day.  He notes that he is very poorly hydrated and knows he does not drink as much as she should.  He reports 20 pound weight loss.  BP elevated 140-170s at home.  He noted mild BLE L>R.  Amlodipine 5 mg at bedtime was added to lisinopril 40 mg.  TTE was obtained 03/30/22 which revealed normal LVEF 60 to 65%, no RWMA, moderate LVH, grade 2 diastolic dysfunction, mildly elevated PASP, moderately dilated LA, mild MR, mild to moderate TR, mild dilatation of the ascending aorta measuring 41 mm.  Lexiscan Myoview 03/30/2022 revealed low risk study with normal LV perfusion and no evidence of ischemia. He was  advised to return in 3 months for follow-up.  He contacted our office 03/11/2022 to report dizziness with somewhat soft BP.  He was advised to decrease amlodipine from 5 to 2.5 mg nightly.  Today, he is here for evaluation of dizziness. He is here alone. Has a tall hand-carved walking stick that he uses to assist with ambulation. Denies any dizziness at present, but has been concerned about recent occurrences. On one occasion, he needed assistance coming off the elevator. Nurse checked his BP and it was elevated. Lives at [facility], lives alone in an apartment. Frequently wakes up early at 0400 and takes a laxative, goes back to sleep and sleeps until 7. Takes his morning medications after breakfast. Breakfast is croissant with egg, sausage and cheese every morning. Lunch is [employer] or left overs, dinner is in the dining room, admits he eats a high sodium diet. Also admits he rarely drinks water. Urine is typically dark yellow. He denies chest pain, shortness of breath, lower extremity edema, fatigue, palpitations, melena, hematuria, hemoptysis, diaphoresis, weakness, presyncope, syncope, orthopnea, and PND. No specific problems with any of his medications. He brought in lab work from 05/09/2022 at [facility].    Past Medical History:  Diagnosis Date   Glaucoma    Hypertension    Psoriasis     Past Surgical History:  Procedure Laterality Date   GLAUCOMA SURGERY Left  HERNIA REPAIR     HYDROCELE EXCISION / REPAIR      Current Medications: Current Meds  Medication Sig   apixaban (ELIQUIS) 5 MG TABS tablet Take 1 tablet (5 mg total) by mouth 2 (two) times daily.   bimatoprost (LUMIGAN) 0.01 % SOLN Place 1 drop into the right eye at bedtime.   brimonidine-timolol (COMBIGAN) 0.2-0.5 % ophthalmic solution Place 1 drop into the right eye 2 (two) times a day.   dorzolamide (TRUSOPT) 2 % ophthalmic solution Place 1 drop into the right eye 2 (two) times daily.   levothyroxine (SYNTHROID) 75 MCG  tablet Take 75 mcg by mouth daily before breakfast.   Multiple Vitamin (MULTIVITAMIN ADULT PO) Take 1 tablet by mouth daily.   omeprazole (PRILOSEC) 40 MG capsule Take 40 mg by mouth daily.   vitamin B-12 (CYANOCOBALAMIN) 1000 MCG tablet Take 1,000 mcg by mouth daily.   [DISCONTINUED] amLODipine (NORVASC) 5 MG tablet Take 1 tablet (5 mg total) by mouth at bedtime.   [DISCONTINUED] lisinopril (ZESTRIL) 40 MG tablet Take 1 tablet (40 mg total) by mouth daily.     Allergies:   Patient has no known allergies.   Social History   Socioeconomic History   Marital status: Married    Spouse name: Not on file   Number of children: Not on file   Years of education: Not on file   Highest education level: Not on file  Occupational History   Not on file  Tobacco Use   Smoking status: Former    Types: Pipe    Passive exposure: Never   Smokeless tobacco: Never   Tobacco comments:    quit >40 years ago  Vaping Use   Vaping Use: Never used  Substance and Sexual Activity   Alcohol use: Yes    Alcohol/week: 7.0 standard drinks of alcohol    Types: 7 Standard drinks or equivalent per week    Comment: 1 drink daily   Drug use: Never   Sexual activity: Not on file  Other Topics Concern   Not on file  Social History Narrative   Wife has alzheimer's   Social Determinants of Health   Financial Resource Strain: Not on file  Food Insecurity: Not on file  Transportation Needs: Not on file  Physical Activity: Not on file  Stress: Not on file  Social Connections: Not on file     Family History: The patient's family history includes High blood pressure in his mother and sister; Stroke in his mother.  ROS:   Please see the history of present illness.    + Dizziness All other systems reviewed and are negative.  Labs/Other Studies Reviewed:    The following studies were reviewed today:  Cardiac Studies & Procedures     STRESS TESTS  MYOCARDIAL PERFUSION IMAGING  03/30/2022  Narrative   The study is normal. The study is low risk.   LV perfusion is normal. There is no evidence of ischemia. There is no evidence of infarction.   Left ventricular function is abnormal. Global function is mildly reduced. Nuclear stress EF: 52 %. The left ventricular ejection fraction is mildly decreased (45-54%). End diastolic cavity size is normal. End systolic cavity size is normal.   ECHOCARDIOGRAM  ECHOCARDIOGRAM COMPLETE 03/30/2022  Narrative ECHOCARDIOGRAM REPORT  IMPRESSIONS   1. Left ventricular ejection fraction, by estimation, is 60 to 65%. The left ventricle has normal function. The left ventricle has no regional wall motion abnormalities. There is moderate left ventricular hypertrophy. Left  ventricular diastolic parameters are consistent with Grade II diastolic dysfunction (pseudonormalization). 2. Right ventricular systolic function is normal. The right ventricular size is normal. There is mildly elevated pulmonary artery systolic pressure. 3. Left atrial size was moderately dilated. 4. The mitral valve is normal in structure. Mild mitral valve regurgitation. No evidence of mitral stenosis. 5. Tricuspid valve regurgitation is mild to moderate. 6. The aortic valve is tricuspid. Aortic valve regurgitation is trivial. No aortic stenosis is present. 7. Aortic dilatation noted. There is mild dilatation of the ascending aorta, measuring 41 mm. 8. The inferior vena cava is normal in size with greater than 50% respiratory variability, suggesting right atrial pressure of 3 mmHg.  FINDINGS Left Ventricle: Left ventricular ejection fraction, by estimation, is 60 to 65%. The left ventricle has normal function. The left ventricle has no regional wall motion abnormalities. The left ventricular internal cavity size was normal in size. There is moderate left ventricular hypertrophy. Left ventricular diastolic parameters are consistent with Grade II diastolic dysfunction  (pseudonormalization). Indeterminate filling pressures.  Right Ventricle: The right ventricular size is normal. No increase in right ventricular wall thickness. Right ventricular systolic function is normal. There is mildly elevated pulmonary artery systolic pressure. The tricuspid regurgitant velocity is 3.09 m/s, and with an assumed right atrial pressure of 3 mmHg, the estimated right ventricular systolic pressure is 41.2 mmHg.  Left Atrium: Left atrial size was moderately dilated.  Right Atrium: Right atrial size was normal in size.  Pericardium: There is no evidence of pericardial effusion.  Mitral Valve: The mitral valve is normal in structure. Mild mitral valve regurgitation. No evidence of mitral valve stenosis.  Tricuspid Valve: The tricuspid valve is normal in structure. Tricuspid valve regurgitation is mild to moderate. No evidence of tricuspid stenosis.  Aortic Valve: The aortic valve is tricuspid. Aortic valve regurgitation is trivial. Aortic regurgitation PHT measures 344 msec. No aortic stenosis is present.  Pulmonic Valve: The pulmonic valve was normal in structure. Pulmonic valve regurgitation is not visualized. No evidence of pulmonic stenosis.  Aorta: Aortic dilatation noted. There is mild dilatation of the ascending aorta, measuring 41 mm.  Venous: The inferior vena cava is normal in size with greater than 50% respiratory variability, suggesting right atrial pressure of 3 mmHg.  IAS/Shunts: No atrial level shunt detected by color flow Doppler.                Recent Labs: No results found for requested labs within last 365 days.  Recent Lipid Panel    Component Value Date/Time   CHOL 160 02/12/2019 1024   TRIG 68 02/12/2019 1024   HDL 48 02/12/2019 1024   CHOLHDL 3.3 02/12/2019 1024   CHOLHDL 4.2 10/10/2018 0426   VLDL 13 10/10/2018 0426   LDLCALC 99 02/12/2019 1024     Risk Assessment/Calculations:    CHA2DS2-VASc Score = 3   This indicates a 3.2%  annual risk of stroke. The patient's score is based upon: CHF History: 0 HTN History: 1 Diabetes History: 0 Stroke History: 0 Vascular Disease History: 0 Age Score: 2 Gender Score: 0          Physical Exam:    VS:  BP 110/62   Pulse 61   Ht 6\' 1"  (1.854 m)   Wt 161 lb 9.6 oz (73.3 kg)   SpO2 97%   BMI 21.32 kg/m     Wt Readings from Last 3 Encounters:  07/19/22 161 lb 9.6 oz (73.3 kg)  03/30/22 158 lb (71.7  kg)  03/08/22 158 lb 9.6 oz (71.9 kg)     GEN: Elderly, thin stature in no acute distress HEENT: Normal NECK: No JVD; No carotid bruits CARDIAC: RRR, no murmurs, rubs, gallops RESPIRATORY:  Clear to auscultation without rales, wheezing or rhonchi  ABDOMEN: Soft, non-tender, non-distended MUSCULOSKELETAL:  No edema; No deformity. 2+ pedal pulses, equal bilaterally SKIN: Warm and dry NEUROLOGIC:  Alert and oriented x 3 PSYCHIATRIC:  Normal affect   EKG:  EKG is ordered today.  EKG reveals sinus rhythm with PVC, nonspecific ST abnormality, incomplete right ventricular conduction delay, no acute change from previous tracing   Diagnoses:    1. Dizziness   2. Essential hypertension   3. Dilatation of aorta   4. Paroxysmal atrial fibrillation   5. Chronic anticoagulation   6. Mild mitral regurgitation   7. Tricuspid valve insufficiency, unspecified etiology    Assessment and Plan:     Dizziness: He reports regular episodes of dizziness. No falls. No presyncope, syncope. He admits to high sodium diet and poor hydration. Encouraged him to reduce sodium intake and increase hydration with water, specifically to drink at least one full glass of water with each meal. Will try changing medications to take 1/2 doses of both anti-hypertensives every 12 hours rather than full dose every 24 hours.  Advised him to notify us if symptoms worsen.  Hypertension: BP initially elevated but significantly improved on my recheck. It is somewhat soft on recheck. Has great number of  BP readings from home which are labile. Renal function on 05/09/22 is stable with SCR 0.94, NA 138, K4.6. Have encouraged him to try splitting his dose of amlodipine and lisinopril to take half in the morning and half in the evening for more consistently controlled BP. I suspect there is a component of dehydration with dizziness.  Encouraged low-sodium diet. Advised him to report if BP does not improve with this med adjustment.   PAF on chronic anticoagulation: HR is well-controlled today.  He does not monitor pulse as often as he monitors BP, but no significant episodes of bradycardia to his awareness. For stroke prevention, continue Eliquis 5 mg twice daily which is appropriate dose for age/weight/renal function.  Valve disease: Mild MR, mild to moderate TR on TTE 03/30/2022. I do not appreciate a significant murmur on exam today. We will continue to monitor clinically at this time. He is scheduled for repeat echocardiogram 03/2023.  Aortic dilatation: Mild dilatation of the ascending aorta measuring 41 mm on TTE 03/30/2022. Is scheduled for repeat echocardiogram 03/2023.     Disposition: 6 months with Dr. Shari ProwsPemberton  Medication Adjustments/Labs and Tests Ordered: Current medicines are reviewed at length with the patient today.  Concerns regarding medicines are outlined above.  Orders Placed This Encounter  Procedures   EKG 12-Lead   Meds ordered this encounter  Medications   amLODipine (NORVASC) 5 MG tablet    Sig: Take 0.5 tablets (2.5 mg total) by mouth in the morning and at bedtime.    Dispense:  90 tablet    Refill:  3   lisinopril (ZESTRIL) 40 MG tablet    Sig: Take 0.5 tablets (20 mg total) by mouth in the morning and at bedtime.    Dispense:  90 tablet    Refill:  1    Patient Instructions  Medication Instructions:   CHANGE Amlodipine one half (0.5) tablet by mouth ( 2.5 mg) twice daily.   CHANGE Lisinopril one half ( 0.5) tablet by mouth (20 mg)  twice daily.   *If you  need a refill on your cardiac medications before your next appointment, please call your pharmacy*   Lab Work:  None ordered.  If you have labs (blood work) drawn today and your tests are completely normal, you will receive your results only by: MyChart Message (if you have MyChart) OR A paper copy in the mail If you have any lab test that is abnormal or we need to change your treatment, we will call you to review the results.   Testing/Procedures:  None ordered.   Follow-Up: At [facility], you and your health needs are our priority.  As part of our continuing mission to provide you with exceptional heart care, we have created designated Provider Care Teams.  These Care Teams include your primary Cardiologist (physician) and Advanced Practice Providers (APPs -  Physician Assistants and Nurse Practitioners) who all work together to provide you with the care you need, when you need it.  We recommend signing up for the patient portal called "MyChart".  Sign up information is provided on this After Visit Summary.  MyChart is used to connect with patients for Virtual Visits (Telemedicine).  Patients are able to view lab/test results, encounter notes, upcoming appointments, etc.  Non-urgent messages can be sent to your provider as well.   To learn more about what you can do with MyChart, go to [email or web address].    Your next appointment:   3 month(s)  Provider:   Meriam Sprague, MD       Signed, Levi Aland, NP  07/19/2022 1:21 PM    [facility]

## 2022-08-10 DIAGNOSIS — L814 Other melanin hyperpigmentation: Secondary | ICD-10-CM | POA: Diagnosis not present

## 2022-08-10 DIAGNOSIS — D485 Neoplasm of uncertain behavior of skin: Secondary | ICD-10-CM | POA: Diagnosis not present

## 2022-08-10 DIAGNOSIS — L905 Scar conditions and fibrosis of skin: Secondary | ICD-10-CM | POA: Diagnosis not present

## 2022-08-10 DIAGNOSIS — D1801 Hemangioma of skin and subcutaneous tissue: Secondary | ICD-10-CM | POA: Diagnosis not present

## 2022-08-10 DIAGNOSIS — L821 Other seborrheic keratosis: Secondary | ICD-10-CM | POA: Diagnosis not present

## 2022-08-10 DIAGNOSIS — L570 Actinic keratosis: Secondary | ICD-10-CM | POA: Diagnosis not present

## 2022-08-10 DIAGNOSIS — L578 Other skin changes due to chronic exposure to nonionizing radiation: Secondary | ICD-10-CM | POA: Diagnosis not present

## 2022-08-10 DIAGNOSIS — D0471 Carcinoma in situ of skin of right lower limb, including hip: Secondary | ICD-10-CM | POA: Diagnosis not present

## 2022-08-10 DIAGNOSIS — C44729 Squamous cell carcinoma of skin of left lower limb, including hip: Secondary | ICD-10-CM | POA: Diagnosis not present

## 2022-09-13 DIAGNOSIS — D485 Neoplasm of uncertain behavior of skin: Secondary | ICD-10-CM | POA: Diagnosis not present

## 2022-09-13 DIAGNOSIS — L281 Prurigo nodularis: Secondary | ICD-10-CM | POA: Diagnosis not present

## 2022-09-13 DIAGNOSIS — L570 Actinic keratosis: Secondary | ICD-10-CM | POA: Diagnosis not present

## 2022-09-19 DIAGNOSIS — C44729 Squamous cell carcinoma of skin of left lower limb, including hip: Secondary | ICD-10-CM | POA: Diagnosis not present

## 2022-09-19 DIAGNOSIS — C44722 Squamous cell carcinoma of skin of right lower limb, including hip: Secondary | ICD-10-CM | POA: Diagnosis not present

## 2022-09-19 DIAGNOSIS — L570 Actinic keratosis: Secondary | ICD-10-CM | POA: Diagnosis not present

## 2022-09-25 ENCOUNTER — Other Ambulatory Visit: Payer: Self-pay

## 2022-09-25 DIAGNOSIS — I483 Typical atrial flutter: Secondary | ICD-10-CM

## 2022-09-25 MED ORDER — APIXABAN 5 MG PO TABS
5.0000 mg | ORAL_TABLET | Freq: Two times a day (BID) | ORAL | 1 refills | Status: DC
Start: 2022-09-25 — End: 2023-07-16

## 2022-09-25 NOTE — Telephone Encounter (Signed)
Prescription refill request for Eliquis received. Indication: Afib  Last office visit: 07/19/22 ([place])  Scr: 0.94 (05/09/22)  Age: 89 Weight: 73.3kg  Appropriate dose. Refill sent.

## 2022-10-02 DIAGNOSIS — L570 Actinic keratosis: Secondary | ICD-10-CM | POA: Diagnosis not present

## 2022-11-05 NOTE — Progress Notes (Unsigned)
Cardiology Office Note:    Date:  11/08/2022   ID:  Neil [name], DOB 1929-08-05, MRN [id number]  PCP:  Tally Joe, MD   [facility] Providers Cardiologist:  Meriam Sprague, MD     Referring MD: Tally Joe, MD    History of Present Illness:    Neil [name] is a 89 y.o. male with a hx of HTN, psoriasis (on enbrel), hypothyroidism, and glaucoma, and paroxysmal atrial fibrillation who was previously followed by Dr. Delton See who now returns to clinic for follow-up of his Afib and HTN.  Per review of the record, patient was diagnosed with Afib in 10/2018. Echo showed normal LVEF, 55-60% and no significant valvular disease. He had his outpatient NST done on 7/17. This was a low risk study and showed no ischemia.  Was seen in clinic 02/2022 where he was feeling fatigued and lightheaded. BP was elevated and he was also having presyncopal symptoms in the setting of dehydration.  TTE 03/30/22 revealed normal LVEF 60 to 65%, no RWMA, moderate LVH, grade 2 diastolic dysfunction, mildly elevated PASP, moderately dilated LA, mild MR, mild to moderate TR, mild dilatation of the ascending aorta measuring 41 mm.  Lexiscan Myoview 03/30/2022 revealed low risk study with normal LV perfusion and no evidence of ischemia.   Was last seen in clinic on 07/2022 by Illene Labrador where he was having continued to have dizziness thought to be due to poor hydration.  Today, the patient overall feels well. States he has been feeling more weak in the morning. Symptoms improve by the afternoon. Also reports that he has lost 20lbs over the past 5 years. Otherwise, no chest pain, SOB, orthopnea, PND or palpitations. Continues to exercise 4x/week without significant exertional symptoms.   Lisinopril recently recommended to stop due to cough.   Past Medical History:  Diagnosis Date   Glaucoma    Hypertension    Psoriasis     Past Surgical History:  Procedure Laterality Date   GLAUCOMA SURGERY  Left    HERNIA REPAIR     HYDROCELE EXCISION / REPAIR      Current Medications: Current Meds  Medication Sig   apixaban (ELIQUIS) 5 MG TABS tablet Take 1 tablet (5 mg total) by mouth 2 (two) times daily.   bimatoprost (LUMIGAN) 0.01 % SOLN Place 1 drop into the right eye at bedtime.   brimonidine-timolol (COMBIGAN) 0.2-0.5 % ophthalmic solution Place 1 drop into the right eye 2 (two) times a day.   dorzolamide (TRUSOPT) 2 % ophthalmic solution Place 1 drop into the right eye 2 (two) times daily.   levothyroxine (SYNTHROID) 75 MCG tablet Take 75 mcg by mouth daily before breakfast.   Multiple Vitamin (MULTIVITAMIN ADULT PO) Take 1 tablet by mouth daily.   omeprazole (PRILOSEC) 40 MG capsule Take 40 mg by mouth daily.   vitamin B-12 (CYANOCOBALAMIN) 1000 MCG tablet Take 1,000 mcg by mouth daily.   [DISCONTINUED] amLODipine (NORVASC) 5 MG tablet Take 0.5 tablets (2.5 mg total) by mouth in the morning and at bedtime.   [DISCONTINUED] amLODipine (NORVASC) 5 MG tablet Take 1 tablet (5 mg total) by mouth daily.   [DISCONTINUED] lisinopril (ZESTRIL) 40 MG tablet Take 0.5 tablets (20 mg total) by mouth in the morning and at bedtime.     Allergies:   Patient has no known allergies.   Social History   Socioeconomic History   Marital status: Married    Spouse name: Not on file   Number of children:  Not on file   Years of education: Not on file   Highest education level: Not on file  Occupational History   Not on file  Tobacco Use   Smoking status: Former    Types: Pipe    Passive exposure: Never   Smokeless tobacco: Never   Tobacco comments:    quit >40 years ago  Vaping Use   Vaping status: Never Used  Substance and Sexual Activity   Alcohol use: Yes    Alcohol/week: 7.0 standard drinks of alcohol    Types: 7 Standard drinks or equivalent per week    Comment: 1 drink daily   Drug use: Never   Sexual activity: Not on file  Other Topics Concern   Not on file  Social History  Narrative   Wife has alzheimer's   Social Determinants of Health   Financial Resource Strain: Not on file  Food Insecurity: Not on file  Transportation Needs: Not on file  Physical Activity: Not on file  Stress: Not on file  Social Connections: Not on file     Family History: The patient's family history includes High blood pressure in his mother and sister; Stroke in his mother.  ROS:   Please see the history of present illness.      EKGs/Labs/Other Studies Reviewed:    The following studies were reviewed today: Cardiac Studies & Procedures     STRESS TESTS  MYOCARDIAL PERFUSION IMAGING 03/30/2022  Narrative   The study is normal. The study is low risk.   LV perfusion is normal. There is no evidence of ischemia. There is no evidence of infarction.   Left ventricular function is abnormal. Global function is mildly reduced. Nuclear stress EF: 52 %. The left ventricular ejection fraction is mildly decreased (45-54%). End diastolic cavity size is normal. End systolic cavity size is normal.   ECHOCARDIOGRAM  ECHOCARDIOGRAM COMPLETE 03/30/2022  Narrative ECHOCARDIOGRAM REPORT    Patient Name:   Neil [name] Date of Exam: 03/30/2022 Medical Rec #:  [id number]       Height:       73.0 in Accession #:    [id number]      Weight:       158.0 lb Date of Birth:  03-31-1930       BSA:          1.947 m Patient Age:    89 years        BP:           162/86 mmHg Patient Gender: M               HR:           67 bpm. Exam Location:  [address]  Procedure: 2D Echo, 3D Echo, Cardiac Doppler and Color Doppler  Indications:    R06.09 DOE  History:        Patient has prior history of Echocardiogram examinations, most recent 10/10/2018. Arrythmias:Atrial Fibrillation and Atrial Flutter; Signs/Symptoms:Fatigue and Edema.  Sonographer:    Clearence Ped RCS Referring Phys: [id number] Neil [name]  IMPRESSIONS   1. Left ventricular ejection fraction, by estimation, is 60 to  65%. The left ventricle has normal function. The left ventricle has no regional wall motion abnormalities. There is moderate left ventricular hypertrophy. Left ventricular diastolic parameters are consistent with Grade II diastolic dysfunction (pseudonormalization). 2. Right ventricular systolic function is normal. The right ventricular size is normal. There is mildly elevated pulmonary artery systolic pressure. 3. Left atrial size  was moderately dilated. 4. The mitral valve is normal in structure. Mild mitral valve regurgitation. No evidence of mitral stenosis. 5. Tricuspid valve regurgitation is mild to moderate. 6. The aortic valve is tricuspid. Aortic valve regurgitation is trivial. No aortic stenosis is present. 7. Aortic dilatation noted. There is mild dilatation of the ascending aorta, measuring 41 mm. 8. The inferior vena cava is normal in size with greater than 50% respiratory variability, suggesting right atrial pressure of 3 mmHg.  FINDINGS Left Ventricle: Left ventricular ejection fraction, by estimation, is 60 to 65%. The left ventricle has normal function. The left ventricle has no regional wall motion abnormalities. The left ventricular internal cavity size was normal in size. There is moderate left ventricular hypertrophy. Left ventricular diastolic parameters are consistent with Grade II diastolic dysfunction (pseudonormalization). Indeterminate filling pressures.  Right Ventricle: The right ventricular size is normal. No increase in right ventricular wall thickness. Right ventricular systolic function is normal. There is mildly elevated pulmonary artery systolic pressure. The tricuspid regurgitant velocity is 3.09 m/s, and with an assumed right atrial pressure of 3 mmHg, the estimated right ventricular systolic pressure is 41.2 mmHg.  Left Atrium: Left atrial size was moderately dilated.  Right Atrium: Right atrial size was normal in size.  Pericardium: There is no evidence of  pericardial effusion.  Mitral Valve: The mitral valve is normal in structure. Mild mitral valve regurgitation. No evidence of mitral valve stenosis.  Tricuspid Valve: The tricuspid valve is normal in structure. Tricuspid valve regurgitation is mild to moderate. No evidence of tricuspid stenosis.  Aortic Valve: The aortic valve is tricuspid. Aortic valve regurgitation is trivial. Aortic regurgitation PHT measures 344 msec. No aortic stenosis is present.  Pulmonic Valve: The pulmonic valve was normal in structure. Pulmonic valve regurgitation is not visualized. No evidence of pulmonic stenosis.  Aorta: Aortic dilatation noted. There is mild dilatation of the ascending aorta, measuring 41 mm.  Venous: The inferior vena cava is normal in size with greater than 50% respiratory variability, suggesting right atrial pressure of 3 mmHg.  IAS/Shunts: No atrial level shunt detected by color flow Doppler.   LEFT VENTRICLE PLAX 2D LVIDd:         4.70 cm   Diastology LVIDs:         2.90 cm   LV e' medial:    7.40 cm/s LV PW:         1.20 cm   LV E/e' medial:  9.8 LV IVS:        1.40 cm   LV e' lateral:   6.74 cm/s LVOT diam:     2.10 cm   LV E/e' lateral: 10.8 LV SV:         82 LV SV Index:   42 LVOT Area:     3.46 cm  3D Volume EF: 3D EF:        55 % LV EDV:       145 ml LV ESV:       65 ml LV SV:        80 ml  RIGHT VENTRICLE RV Basal diam:  4.10 cm RV S prime:     17.30 cm/s TAPSE (M-mode): 2.5 cm RVSP:           41.2 mmHg  LEFT ATRIUM             Index        RIGHT ATRIUM           Index LA  diam:        4.30 cm 2.21 cm/m   RA Pressure: 3.00 mmHg LA Vol (A2C):   82.0 ml 42.13 ml/m  RA Area:     20.40 cm LA Vol (A4C):   70.1 ml 36.01 ml/m  RA Volume:   53.90 ml  27.69 ml/m LA Biplane Vol: 77.8 ml 39.97 ml/m AORTIC VALVE LVOT Vmax:   104.00 cm/s LVOT Vmean:  66.700 cm/s LVOT VTI:    0.238 m AI PHT:      344 msec  AORTA Ao Root diam: 3.40 cm Ao Asc diam:  4.10  cm  MITRAL VALVE               TRICUSPID VALVE MV Area (PHT):             TR Peak grad:   38.2 mmHg MV Decel Time:             TR Vmax:        309.00 cm/s MV E velocity: 72.80 cm/s  Estimated RAP:  3.00 mmHg MV A velocity: 69.20 cm/s  RVSP:           41.2 mmHg MV E/A ratio:  1.05 SHUNTS Systemic VTI:  0.24 m Systemic Diam: 2.10 cm  Chilton Si MD Electronically signed by Chilton Si MD Signature Date/Time: 03/30/2022/2:49:26 PM    Final               EKG: NSR, LVH  Recent Labs: No results found for requested labs within last 365 days.  Recent Lipid Panel    Component Value Date/Time   CHOL 160 02/12/2019 1024   TRIG 68 02/12/2019 1024   HDL 48 02/12/2019 1024   CHOLHDL 3.3 02/12/2019 1024   CHOLHDL 4.2 10/10/2018 0426   VLDL 13 10/10/2018 0426   LDLCALC 99 02/12/2019 1024     Risk Assessment/Calculations:    CHA2DS2-VASc Score = 3  {This indicates a 3.2% annual risk of stroke. The patient's score is based upon: CHF History: 0 HTN History: 1 Diabetes History: 0 Stroke History: 0 Vascular Disease History: 0 Age Score: 2 Gender Score: 0      Physical Exam:    VS:  BP 130/82   Pulse 68   Ht 6' (1.829 m)   Wt 160 lb (72.6 kg)   SpO2 97%   BMI 21.70 kg/m     Wt Readings from Last 3 Encounters:  11/08/22 160 lb (72.6 kg)  07/19/22 161 lb 9.6 oz (73.3 kg)  03/30/22 158 lb (71.7 kg)     GEN: Elderly male, NAD HEENT: Normal NECK: No JVD; No carotid bruits CARDIAC: RRR, no murmurs, rubs, gallops RESPIRATORY:  CTAB, no wheezes ABDOMEN: Soft, non-tender, non-distended MUSCULOSKELETAL:  1+ LE edema, warm SKIN: Warm and dry NEUROLOGIC:  Alert and oriented x 3 PSYCHIATRIC:  Normal affect   ASSESSMENT:    1. Paroxysmal atrial fibrillation (HCC)   2. Essential hypertension   3. Chronic anticoagulation   4. Dilatation of aorta (HCC)   5. Mild mitral regurgitation      PLAN:    In order of problems listed  above:  #Fatigue: #DOE -Reassuring cardiac work-up with normal myoview -TTE with EF 60-65%, G2DD, mild MR, mild to mod TR, ascending aorta 41 -Discussed importance of hydration and Bp control  #Paroxysmal Afib: CHADs-vasc 3. On AC with apixaban BID without bleeding issues. Not requiring nodal agents. -Continue apixaban 5mg  BID -Not on nodal agents  #LE Edema: Chronic and very  minimal on exam. TTE with normal LVEF, no significant valve disease. Diastolic function indeterminant due to afib. -Continue compression socks and ankle pumps -Off HCTZ due to concern for dehydration  #HTN: Controlled but having cough on lisinopril. -Increase amlodipine to 5mg  daily -Hold lisinopril -If SBP>140s consistently, can either increase amlodipine to 10mg  daily OR add ARB  #Mildly dilated aorta: -Mildly dilated 41mm; continue serial monitoring      Medication Adjustments/Labs and Tests Ordered: Current medicines are reviewed at length with the patient today.  Concerns regarding medicines are outlined above.  Orders Placed This Encounter  Procedures   EKG 12-Lead   Meds ordered this encounter  Medications   DISCONTD: amLODipine (NORVASC) 5 MG tablet    Sig: Take 1 tablet (5 mg total) by mouth daily.    Dispense:  180 tablet    Refill:  2    Dose increase   amLODipine (NORVASC) 5 MG tablet    Sig: Take 1 tablet (5 mg total) by mouth daily.    Dispense:  90 tablet    Refill:  3    Dose increase    Patient Instructions  Medication Instructions:   STOP TAKING LISINOPRIL NOW  INCREASE YOUR AMLODIPINE TO 5 MG BY MOUTH DAILY  *If you need a refill on your cardiac medications before your next appointment, please call your pharmacy*     Follow-Up:  1.)  6 WEEKS WITH AN EXTENDER IN THE OFFICE FOR BLOOD PRESSURE MANAGEMENT  2.)  6 MONTHS WITH DR. Anne Fu IN THE OFFICE     Signed, Meriam Sprague, MD  11/08/2022 2:03 PM    [facility]

## 2022-11-07 DIAGNOSIS — I4891 Unspecified atrial fibrillation: Secondary | ICD-10-CM | POA: Diagnosis not present

## 2022-11-07 DIAGNOSIS — H409 Unspecified glaucoma: Secondary | ICD-10-CM | POA: Diagnosis not present

## 2022-11-07 DIAGNOSIS — E538 Deficiency of other specified B group vitamins: Secondary | ICD-10-CM | POA: Diagnosis not present

## 2022-11-07 DIAGNOSIS — K219 Gastro-esophageal reflux disease without esophagitis: Secondary | ICD-10-CM | POA: Diagnosis not present

## 2022-11-07 DIAGNOSIS — R609 Edema, unspecified: Secondary | ICD-10-CM | POA: Diagnosis not present

## 2022-11-07 DIAGNOSIS — E039 Hypothyroidism, unspecified: Secondary | ICD-10-CM | POA: Diagnosis not present

## 2022-11-07 DIAGNOSIS — N529 Male erectile dysfunction, unspecified: Secondary | ICD-10-CM | POA: Diagnosis not present

## 2022-11-07 DIAGNOSIS — E782 Mixed hyperlipidemia: Secondary | ICD-10-CM | POA: Diagnosis not present

## 2022-11-07 DIAGNOSIS — I10 Essential (primary) hypertension: Secondary | ICD-10-CM | POA: Diagnosis not present

## 2022-11-07 DIAGNOSIS — D6869 Other thrombophilia: Secondary | ICD-10-CM | POA: Diagnosis not present

## 2022-11-07 DIAGNOSIS — M109 Gout, unspecified: Secondary | ICD-10-CM | POA: Diagnosis not present

## 2022-11-07 DIAGNOSIS — R7303 Prediabetes: Secondary | ICD-10-CM | POA: Diagnosis not present

## 2022-11-08 ENCOUNTER — Ambulatory Visit: Payer: Medicare Other | Attending: Cardiology | Admitting: Cardiology

## 2022-11-08 ENCOUNTER — Encounter: Payer: Self-pay | Admitting: Cardiology

## 2022-11-08 VITALS — BP 130/82 | HR 68 | Ht 72.0 in | Wt 160.0 lb

## 2022-11-08 DIAGNOSIS — I340 Nonrheumatic mitral (valve) insufficiency: Secondary | ICD-10-CM | POA: Diagnosis not present

## 2022-11-08 DIAGNOSIS — I10 Essential (primary) hypertension: Secondary | ICD-10-CM | POA: Diagnosis not present

## 2022-11-08 DIAGNOSIS — I77819 Aortic ectasia, unspecified site: Secondary | ICD-10-CM | POA: Insufficient documentation

## 2022-11-08 DIAGNOSIS — Z7901 Long term (current) use of anticoagulants: Secondary | ICD-10-CM | POA: Insufficient documentation

## 2022-11-08 DIAGNOSIS — I480 Paroxysmal atrial fibrillation: Secondary | ICD-10-CM | POA: Insufficient documentation

## 2022-11-08 MED ORDER — AMLODIPINE BESYLATE 5 MG PO TABS: 5.0000 mg | ORAL_TABLET | Freq: Every day | ORAL | 3 refills | Status: DC

## 2022-11-08 MED ORDER — AMLODIPINE BESYLATE 5 MG PO TABS: 5.0000 mg | ORAL_TABLET | Freq: Every day | ORAL | 2 refills | Status: DC

## 2022-11-08 NOTE — Patient Instructions (Addendum)
Medication Instructions:   STOP TAKING LISINOPRIL NOW  INCREASE YOUR AMLODIPINE TO 5 MG BY MOUTH DAILY  *If you need a refill on your cardiac medications before your next appointment, please call your pharmacy*     Follow-Up:  1.)  6 WEEKS WITH AN EXTENDER IN THE OFFICE FOR BLOOD PRESSURE MANAGEMENT  2.)  6 MONTHS WITH DR. Anne Fu IN THE OFFICE

## 2022-11-21 DIAGNOSIS — R351 Nocturia: Secondary | ICD-10-CM | POA: Diagnosis not present

## 2022-11-21 DIAGNOSIS — N401 Enlarged prostate with lower urinary tract symptoms: Secondary | ICD-10-CM | POA: Diagnosis not present

## 2022-11-21 DIAGNOSIS — N5201 Erectile dysfunction due to arterial insufficiency: Secondary | ICD-10-CM | POA: Diagnosis not present

## 2022-11-29 DIAGNOSIS — L814 Other melanin hyperpigmentation: Secondary | ICD-10-CM | POA: Diagnosis not present

## 2022-11-29 DIAGNOSIS — C44729 Squamous cell carcinoma of skin of left lower limb, including hip: Secondary | ICD-10-CM | POA: Diagnosis not present

## 2022-11-29 DIAGNOSIS — D485 Neoplasm of uncertain behavior of skin: Secondary | ICD-10-CM | POA: Diagnosis not present

## 2022-11-29 DIAGNOSIS — L570 Actinic keratosis: Secondary | ICD-10-CM | POA: Diagnosis not present

## 2022-11-29 DIAGNOSIS — D0472 Carcinoma in situ of skin of left lower limb, including hip: Secondary | ICD-10-CM | POA: Diagnosis not present

## 2022-12-14 DIAGNOSIS — Q5561 Curvature of penis (lateral): Secondary | ICD-10-CM | POA: Diagnosis not present

## 2022-12-14 DIAGNOSIS — N5201 Erectile dysfunction due to arterial insufficiency: Secondary | ICD-10-CM | POA: Diagnosis not present

## 2022-12-15 DIAGNOSIS — Z23 Encounter for immunization: Secondary | ICD-10-CM | POA: Diagnosis not present

## 2022-12-18 NOTE — Progress Notes (Unsigned)
Cardiology Office Note:  .   Date:  12/18/2022  ID:  Neil [name], DOB 22-Nov-1929, MRN [id number] PCP: Tally Joe, MD  [facility] Providers Cardiologist:  Meriam Sprague, MD (Inactive) { Click to update primary MD,subspecialty MD or APP then REFRESH:1}   Patient Profile: .      PMH Hypertension Psoriasis PAF CHA2DS2-VASc score of 3 OAC on apixaban  Hypothyroidism Low risk nuclear stress test 10/2015 and 03/30/2022 Ascending aortic dilatation Mitral regurgitation  Previously seen by Dr. Delton See, he was referred for symptomatic atrial fibrillation with palpitations and chest pain.   He reported fatigue and activity intolerance with near syncope. Echocardiogram completed 03/30/22 revealed LVEF 60 to 65%, no RWMA, moderate LVH, G2 DD, mildly elevated pulmonary artery systolic pressure, mildly dilated LA, mild MR, mild to moderate TR, mild dilatation of the ascending aorta at 41 mm. He had low risk NST on 03/30/2022.   Last cardiology clinic visit was 11/08/2022 with Dr. Shari Prows at which time he reported he was feeling well with some weakness in the mornings which improves by the afternoon. Continuing to exercise 4 x/week without significant exertional symptoms.  Lisinopril had recently been stopped due to cough.  He was advised to continue compression socks and ankle pumps for LE edema. Off hydrochlorothiazide due to concern for dehydration. Amlodipine increased to 5 mg daily. Advised to increase hydration and take 1/2 doses of anti-hypertensives every 12 hours rather than full dose every 24 hr.        History of Present Illness: .   Neil [name] is a *** 89 y.o. male who is here today for follow-up.   ROS: ***       Studies Reviewed: .        *** Risk Assessment/Calculations:    CHA2DS2-VASc Score = 3  {Confirm score is correct.  If not, click here to update score.  REFRESH note.  :1} This indicates a 3.2% annual risk of stroke. The patient's score is based  upon: CHF History: 0 HTN History: 1 Diabetes History: 0 Stroke History: 0 Vascular Disease History: 0 Age Score: 2 Gender Score: 0   {This patient has a significant risk of stroke if diagnosed with atrial fibrillation.  Please consider VKA or DOAC agent for anticoagulation if the bleeding risk is acceptable.   You can also use the SmartPhrase .[email or web address] for documentation.   :[id number]} No BP recorded.  {Refresh Note OR Click here to enter BP  :1}***       Physical Exam:   VS:  There were no vitals taken for this visit.   Wt Readings from Last 3 Encounters:  11/08/22 160 lb (72.6 kg)  07/19/22 161 lb 9.6 oz (73.3 kg)  03/30/22 158 lb (71.7 kg)    GEN: Well nourished, well developed in no acute distress NECK: No JVD; No carotid bruits CARDIAC: ***RRR, no murmurs, rubs, gallops RESPIRATORY:  Clear to auscultation without rales, wheezing or rhonchi  ABDOMEN: Soft, non-tender, non-distended EXTREMITIES:  No edema; No deformity     ASSESSMENT AND PLAN: .    Ascending aortic dilatation: Mild dilatation of ascending aorta measuring 41 mm on TTE 03/30/2022. Plan to repeat echo 03/2023.   Hypertension:   PAF on chronic anticoagulation: For stroke prevention for CHA2DS2-VASc score of 3  Valve disease: Mild MR, mild to moderate TR on TTE 03/30/2022.     {Are you ordering a CV Procedure (e.g. stress test, cath, DCCV, TEE, etc)?   Press F2        :  [id number]}  Dispo: ***  Signed, Eligha Bridegroom, NP-C

## 2022-12-19 ENCOUNTER — Encounter: Payer: Self-pay | Admitting: Nurse Practitioner

## 2022-12-19 ENCOUNTER — Ambulatory Visit: Payer: Medicare Other | Attending: Nurse Practitioner | Admitting: Nurse Practitioner

## 2022-12-19 VITALS — BP 132/66 | HR 71 | Ht 73.0 in | Wt 164.8 lb

## 2022-12-19 DIAGNOSIS — I10 Essential (primary) hypertension: Secondary | ICD-10-CM | POA: Insufficient documentation

## 2022-12-19 DIAGNOSIS — I480 Paroxysmal atrial fibrillation: Secondary | ICD-10-CM | POA: Diagnosis present

## 2022-12-19 DIAGNOSIS — I071 Rheumatic tricuspid insufficiency: Secondary | ICD-10-CM | POA: Insufficient documentation

## 2022-12-19 DIAGNOSIS — R600 Localized edema: Secondary | ICD-10-CM | POA: Diagnosis not present

## 2022-12-19 DIAGNOSIS — I77819 Aortic ectasia, unspecified site: Secondary | ICD-10-CM | POA: Diagnosis present

## 2022-12-19 DIAGNOSIS — Z7901 Long term (current) use of anticoagulants: Secondary | ICD-10-CM | POA: Insufficient documentation

## 2022-12-19 DIAGNOSIS — I340 Nonrheumatic mitral (valve) insufficiency: Secondary | ICD-10-CM | POA: Insufficient documentation

## 2022-12-19 NOTE — Patient Instructions (Signed)
Medication Instructions:   Your physician recommends that you continue on your current medications as directed. Please refer to the Current Medication list given to you today.   *If you need a refill on your cardiac medications before your next appointment, please call your pharmacy*   Lab Work:  None ordered.  If you have labs (blood work) drawn today and your tests are completely normal, you will receive your results only by: MyChart Message (if you have MyChart) OR A paper copy in the mail If you have any lab test that is abnormal or we need to change your treatment, we will call you to review the results.   Testing/Procedures:  KEEP ECHO APPOINTMENT IN DECEMBER   Follow-Up: At [facility], you and your health needs are our priority.  As part of our continuing mission to provide you with exceptional heart care, we have created designated Provider Care Teams.  These Care Teams include your primary Cardiologist (physician) and Advanced Practice Providers (APPs -  Physician Assistants and Nurse Practitioners) who all work together to provide you with the care you need, when you need it.  We recommend signing up for the patient portal called "MyChart".  Sign up information is provided on this After Visit Summary.  MyChart is used to connect with patients for Virtual Visits (Telemedicine).  Patients are able to view lab/test results, encounter notes, upcoming appointments, etc.  Non-urgent messages can be sent to your provider as well.   To learn more about what you can do with MyChart, go to [email or web address].    Your next appointment:   6 month(s)  Provider:   Eligha Bridegroom, NP         Other Instructions  I will call you in January to schedule a March appointment with Eligha Bridegroom, NP

## 2023-01-01 DIAGNOSIS — Z961 Presence of intraocular lens: Secondary | ICD-10-CM | POA: Diagnosis not present

## 2023-01-01 DIAGNOSIS — H401113 Primary open-angle glaucoma, right eye, severe stage: Secondary | ICD-10-CM | POA: Diagnosis not present

## 2023-01-01 DIAGNOSIS — H0102B Squamous blepharitis left eye, upper and lower eyelids: Secondary | ICD-10-CM | POA: Diagnosis not present

## 2023-01-01 DIAGNOSIS — H0102A Squamous blepharitis right eye, upper and lower eyelids: Secondary | ICD-10-CM | POA: Diagnosis not present

## 2023-01-01 DIAGNOSIS — H532 Diplopia: Secondary | ICD-10-CM | POA: Diagnosis not present

## 2023-01-01 DIAGNOSIS — H401122 Primary open-angle glaucoma, left eye, moderate stage: Secondary | ICD-10-CM | POA: Diagnosis not present

## 2023-01-15 DIAGNOSIS — C44729 Squamous cell carcinoma of skin of left lower limb, including hip: Secondary | ICD-10-CM | POA: Diagnosis not present

## 2023-01-15 DIAGNOSIS — D485 Neoplasm of uncertain behavior of skin: Secondary | ICD-10-CM | POA: Diagnosis not present

## 2023-01-15 DIAGNOSIS — C44722 Squamous cell carcinoma of skin of right lower limb, including hip: Secondary | ICD-10-CM | POA: Diagnosis not present

## 2023-01-29 DIAGNOSIS — R609 Edema, unspecified: Secondary | ICD-10-CM | POA: Diagnosis not present

## 2023-01-29 DIAGNOSIS — C44722 Squamous cell carcinoma of skin of right lower limb, including hip: Secondary | ICD-10-CM | POA: Diagnosis not present

## 2023-02-26 DIAGNOSIS — C44729 Squamous cell carcinoma of skin of left lower limb, including hip: Secondary | ICD-10-CM | POA: Diagnosis not present

## 2023-03-15 DIAGNOSIS — N5201 Erectile dysfunction due to arterial insufficiency: Secondary | ICD-10-CM | POA: Diagnosis not present

## 2023-03-26 DIAGNOSIS — Z5189 Encounter for other specified aftercare: Secondary | ICD-10-CM | POA: Diagnosis not present

## 2023-03-26 DIAGNOSIS — L578 Other skin changes due to chronic exposure to nonionizing radiation: Secondary | ICD-10-CM | POA: Diagnosis not present

## 2023-03-26 DIAGNOSIS — L570 Actinic keratosis: Secondary | ICD-10-CM | POA: Diagnosis not present

## 2023-03-29 ENCOUNTER — Ambulatory Visit (HOSPITAL_COMMUNITY): Payer: Medicare Other

## 2023-04-25 ENCOUNTER — Telehealth (HOSPITAL_BASED_OUTPATIENT_CLINIC_OR_DEPARTMENT_OTHER): Payer: Self-pay | Admitting: *Deleted

## 2023-04-25 NOTE — Telephone Encounter (Signed)
-----   Message from [facility] Carias G sent at 12/19/2022  1:43 PM EDT ----- Please call pt in January to schedule appt in March around 1:30.

## 2023-04-25 NOTE — Telephone Encounter (Signed)
 Lvm to schedule pt's appt in April. Will try later.

## 2023-04-27 NOTE — Telephone Encounter (Signed)
Patient returned call. He states he would prefer to schedule when he goes to the office for 1/21 echo.

## 2023-05-01 ENCOUNTER — Ambulatory Visit (HOSPITAL_COMMUNITY): Payer: Medicare Other | Attending: Cardiology

## 2023-05-01 DIAGNOSIS — I77819 Aortic ectasia, unspecified site: Secondary | ICD-10-CM | POA: Diagnosis not present

## 2023-05-01 DIAGNOSIS — I071 Rheumatic tricuspid insufficiency: Secondary | ICD-10-CM | POA: Diagnosis not present

## 2023-05-01 DIAGNOSIS — I340 Nonrheumatic mitral (valve) insufficiency: Secondary | ICD-10-CM | POA: Insufficient documentation

## 2023-05-01 LAB — ECHOCARDIOGRAM COMPLETE
Area-P 1/2: 3.08 cm2
S' Lateral: 2.8 cm

## 2023-05-08 DIAGNOSIS — H532 Diplopia: Secondary | ICD-10-CM | POA: Diagnosis not present

## 2023-05-08 DIAGNOSIS — Z961 Presence of intraocular lens: Secondary | ICD-10-CM | POA: Diagnosis not present

## 2023-05-08 DIAGNOSIS — H401113 Primary open-angle glaucoma, right eye, severe stage: Secondary | ICD-10-CM | POA: Diagnosis not present

## 2023-05-08 DIAGNOSIS — H0102A Squamous blepharitis right eye, upper and lower eyelids: Secondary | ICD-10-CM | POA: Diagnosis not present

## 2023-05-08 DIAGNOSIS — H401122 Primary open-angle glaucoma, left eye, moderate stage: Secondary | ICD-10-CM | POA: Diagnosis not present

## 2023-05-08 DIAGNOSIS — H0102B Squamous blepharitis left eye, upper and lower eyelids: Secondary | ICD-10-CM | POA: Diagnosis not present

## 2023-05-24 DIAGNOSIS — K219 Gastro-esophageal reflux disease without esophagitis: Secondary | ICD-10-CM | POA: Diagnosis not present

## 2023-05-24 DIAGNOSIS — E538 Deficiency of other specified B group vitamins: Secondary | ICD-10-CM | POA: Diagnosis not present

## 2023-05-24 DIAGNOSIS — M109 Gout, unspecified: Secondary | ICD-10-CM | POA: Diagnosis not present

## 2023-05-24 DIAGNOSIS — R7303 Prediabetes: Secondary | ICD-10-CM | POA: Diagnosis not present

## 2023-05-24 DIAGNOSIS — I7121 Aneurysm of the ascending aorta, without rupture: Secondary | ICD-10-CM | POA: Diagnosis not present

## 2023-05-24 DIAGNOSIS — R609 Edema, unspecified: Secondary | ICD-10-CM | POA: Diagnosis not present

## 2023-05-24 DIAGNOSIS — E782 Mixed hyperlipidemia: Secondary | ICD-10-CM | POA: Diagnosis not present

## 2023-05-24 DIAGNOSIS — I10 Essential (primary) hypertension: Secondary | ICD-10-CM | POA: Diagnosis not present

## 2023-05-24 DIAGNOSIS — I4891 Unspecified atrial fibrillation: Secondary | ICD-10-CM | POA: Diagnosis not present

## 2023-05-24 DIAGNOSIS — E039 Hypothyroidism, unspecified: Secondary | ICD-10-CM | POA: Diagnosis not present

## 2023-05-24 DIAGNOSIS — Z0000 Encounter for general adult medical examination without abnormal findings: Secondary | ICD-10-CM | POA: Diagnosis not present

## 2023-05-24 DIAGNOSIS — Z1331 Encounter for screening for depression: Secondary | ICD-10-CM | POA: Diagnosis not present

## 2023-05-28 DIAGNOSIS — R059 Cough, unspecified: Secondary | ICD-10-CM | POA: Diagnosis not present

## 2023-05-28 DIAGNOSIS — J189 Pneumonia, unspecified organism: Secondary | ICD-10-CM | POA: Diagnosis not present

## 2023-06-04 DIAGNOSIS — J189 Pneumonia, unspecified organism: Secondary | ICD-10-CM | POA: Diagnosis not present

## 2023-06-11 ENCOUNTER — Encounter: Payer: Self-pay | Admitting: Nurse Practitioner

## 2023-06-11 DIAGNOSIS — L570 Actinic keratosis: Secondary | ICD-10-CM | POA: Diagnosis not present

## 2023-06-13 ENCOUNTER — Ambulatory Visit: Attending: Physician Assistant | Admitting: Physician Assistant

## 2023-06-13 ENCOUNTER — Encounter: Payer: Self-pay | Admitting: Physician Assistant

## 2023-06-13 VITALS — BP 128/68 | HR 71 | Ht 73.0 in | Wt 161.2 lb

## 2023-06-13 DIAGNOSIS — I340 Nonrheumatic mitral (valve) insufficiency: Secondary | ICD-10-CM | POA: Diagnosis not present

## 2023-06-13 DIAGNOSIS — I77819 Aortic ectasia, unspecified site: Secondary | ICD-10-CM | POA: Insufficient documentation

## 2023-06-13 DIAGNOSIS — I480 Paroxysmal atrial fibrillation: Secondary | ICD-10-CM | POA: Insufficient documentation

## 2023-06-13 DIAGNOSIS — I071 Rheumatic tricuspid insufficiency: Secondary | ICD-10-CM | POA: Insufficient documentation

## 2023-06-13 DIAGNOSIS — R600 Localized edema: Secondary | ICD-10-CM | POA: Diagnosis not present

## 2023-06-13 DIAGNOSIS — I10 Essential (primary) hypertension: Secondary | ICD-10-CM | POA: Insufficient documentation

## 2023-06-13 DIAGNOSIS — Z7901 Long term (current) use of anticoagulants: Secondary | ICD-10-CM | POA: Insufficient documentation

## 2023-06-13 MED ORDER — AMLODIPINE BESYLATE 10 MG PO TABS: 10.0000 mg | ORAL_TABLET | Freq: Every day | ORAL | 2 refills | Status: DC

## 2023-06-13 NOTE — Patient Instructions (Signed)
 Medication Instructions:   START TAKING :  NORVASC 10 MG ONCE A DAY  *If you need a refill on your cardiac medications before your next appointment, please call your pharmacy*   Lab Work: NONE ORDERED  TODAY    If you have labs (blood work) drawn today and your tests are completely normal, you will receive your results only by: MyChart Message (if you have MyChart) OR A paper copy in the mail If you have any lab test that is abnormal or we need to change your treatment, we will call you to review the results.   Testing/Procedures: NONE ORDERED  TODAY     Follow-Up: At [facility], you and your health needs are our priority.  As part of our continuing mission to provide you with exceptional heart care, we have created designated Provider Care Teams.  These Care Teams include your primary Cardiologist (physician) and Advanced Practice Providers (APPs -  Physician Assistants and Nurse Practitioners) who all work together to provide you with the care you need, when you need it.  We recommend signing up for the patient portal called "MyChart".  Sign up information is provided on this After Visit Summary.  MyChart is used to connect with patients for Virtual Visits (Telemedicine).  Patients are able to view lab/test results, encounter notes, upcoming appointments, etc.  Non-urgent messages can be sent to your provider as well.   To learn more about what you can do with MyChart, go to [email or web address].     Your next appointment:    6 month(s)   Provider:     DR Jacques Navy     Other Instructions    1st Floor: - Lobby - Registration  - Pharmacy  - Lab - Cafe  2nd Floor: - PV Lab - Diagnostic Testing (echo, CT, nuclear med)  3rd Floor: - Vacant  4th Floor: - TCTS (cardiothoracic surgery) - AFib Clinic - Structural Heart Clinic - Vascular Surgery  - Vascular Ultrasound  5th Floor: - HeartCare Cardiology (general and EP) - Clinical Pharmacy for  coumadin, hypertension, lipid, weight-loss medications, and med management appointments    Valet parking services will be available as well.

## 2023-06-13 NOTE — Progress Notes (Signed)
 Cardiology Office Note:  .   Date:  06/13/2023  ID:  Arnell Sieving, DOB 12-10-1929, MRN [id number] PCP: Tally Joe, MD  [facility] Providers Cardiologist:  Meriam Sprague, MD (Inactive) {  History of Present Illness: .   Neil [name] is a 89 y.o. male with a past medical history of ascending aortic aneurysm, hypertension, PAF on chronic anticoagulation, leg edema, and mild MR, mild to moderate TR here for follow-up appointment.  He was seen about 6 months ago and reported he was having some issues with his eyedrops.  Can exercise 4 times a week for 30 minutes at a time but at other times he feels he cannot do what he wants to do.  Feeling was worse in the morning and improves as the day goes on.  He was getting confused trying to take amlodipine twice a day so now he only takes it in the mornings.  No chest pain or shortness of breath.  No palpitations, orthopnea, PND, edema, dizziness, syncope, or presyncope.  He continues to improve hydration.  Sleeps in his chair and gets a pressure ulcer because he is there too long.  Coughing has improved off lisinopril.  Coffee worsens it.  Overall was feeling better.  Today, he presents with a history of aortic aneurysm, hypertension, atrial fibrillation, and recent pneumonia, for a follow-up visit. The patient has been self-adjusting his amlodipine dosage based on his blood pressure readings, taking an extra pill when his blood pressure readings are high. The patient reports a lot of variability in his blood pressure readings, with readings as high as 170/74 in the morning and as low as 140/93 in the evening. The patient also reports some leg swelling, which has been a chronic issue. The patient recently had a dermatological procedure to treat a skin condition, which has been causing some discomfort. The patient denies any recent issues with his atrial fibrillation and reports no issues with his Eliquis.  Reports no shortness of breath nor  dyspnea on exertion. Reports no chest pain, pressure, or tightness. No edema, orthopnea, PND. Reports no palpitations.   Discussed the use of AI scribe software for clinical note transcription with the patient, who gave verbal consent to proceed.  ROS: Pertinent ROS in HPI  Studies Reviewed: .       Echo 05/01/23  IMPRESSIONS     1. Left ventricular ejection fraction, by estimation, is 60 to 65%. The  left ventricle has normal function. The left ventricle has no regional  wall motion abnormalities. There is moderate asymmetric left ventricular  hypertrophy of the septal segment.  Left ventricular diastolic parameters are consistent with Grade I  diastolic dysfunction (impaired relaxation).   2. Right ventricular systolic function is normal. The right ventricular  size is severely enlarged. There is normal pulmonary artery systolic  pressure.   3. Left atrial size was mildly dilated.   4. Posterior leaflet prolapse. The mitral valve is abnormal. Mild mitral  valve regurgitation. No evidence of mitral stenosis.   5. Tricuspid valve regurgitation is mild to moderate.   6. The aortic valve is tricuspid. Aortic valve regurgitation is trivial.  No aortic stenosis is present.   7. Aortic dilatation noted. Aneurysm of the ascending aorta, measuring 47  mm. There is mild dilatation of the aortic arch, measuring 39 mm.   8. The inferior vena cava is normal in size with greater than 50%  respiratory variability, suggesting right atrial pressure of 3 mmHg.  Comparison(s): Prior images reviewed side by side. Notable increase in  ascending aortic dimensions from 2023.   FINDINGS   Left Ventricle: Left ventricular ejection fraction, by estimation, is 60  to 65%. The left ventricle has normal function. The left ventricle has no  regional wall motion abnormalities. 3D ejection fraction reviewed and  evaluated as part of the  interpretation. Alternate measurement of EF is felt to be most  reflective  of LV function. The left ventricular internal cavity size was normal in  size. There is moderate asymmetric left ventricular hypertrophy of the  septal segment. Left ventricular  diastolic parameters are consistent with Grade I diastolic dysfunction  (impaired relaxation).   Right Ventricle: The right ventricular size is severely enlarged. No  increase in right ventricular wall thickness. Right ventricular systolic  function is normal. There is normal pulmonary artery systolic pressure.  The tricuspid regurgitant velocity is  2.75 m/s, and with an assumed right atrial pressure of 3 mmHg, the  estimated right ventricular systolic pressure is 33.2 mmHg.   Left Atrium: Left atrial size was mildly dilated.   Right Atrium: Right atrial size was normal in size.   Pericardium: There is no evidence of pericardial effusion.   Mitral Valve: Posterior leaflet prolapse. The mitral valve is abnormal.  Mild mitral valve regurgitation. No evidence of mitral valve stenosis.   Tricuspid Valve: The tricuspid valve is normal in structure. Tricuspid  valve regurgitation is mild to moderate. No evidence of tricuspid  stenosis.   Aortic Valve: The aortic valve is tricuspid. Aortic valve regurgitation is  trivial. No aortic stenosis is present.   Pulmonic Valve: The pulmonic valve was normal in structure. Pulmonic valve  regurgitation is not visualized. No evidence of pulmonic stenosis.   Aorta: Aortic dilatation noted. There is mild dilatation of the aortic  arch, measuring 39 mm. There is an aneurysm involving the ascending aorta  measuring 47 mm.   Venous: The inferior vena cava is normal in size with greater than 50%  respiratory variability, suggesting right atrial pressure of 3 mmHg.   IAS/Shunts: The atrial septum is grossly normal.       Physical Exam:   VS:  BP 128/68   Pulse 71   Ht 6\' 1"  (1.854 m)   Wt 161 lb 3.2 oz (73.1 kg)   SpO2 98%   BMI 21.27 kg/m    Wt  Readings from Last 3 Encounters:  06/13/23 161 lb 3.2 oz (73.1 kg)  12/19/22 164 lb 12.8 oz (74.8 kg)  11/08/22 160 lb (72.6 kg)    GEN: Well nourished, well developed in no acute distress NECK: No JVD; No carotid bruits CARDIAC: RRR, no murmurs, rubs, gallops RESPIRATORY:  Clear to auscultation without rales, wheezing or rhonchi  ABDOMEN: Soft, non-tender, non-distended EXTREMITIES:  No edema; No deformity   ASSESSMENT AND PLAN: .   Ascending Aortic Aneurysm   Slight increase in size noted on recent echocardiogram. Importance of blood pressure control emphasized.   -Continue current management and monitoring.    Hypertension   Patient self-adjusting Amlodipine dose based on blood pressure readings, leading to variability. Discussed the 24-hour effect of Amlodipine and the need for consistent dosing.   -Increase Amlodipine to 10mg  daily, to be taken in the morning when blood pressure is typically higher.   -Continue blood pressure monitoring, ideally an hour after taking medication.    Lower Extremity Edema   Noted in left leg, possibly related to Amlodipine use and previous injury.   -  Consider use of compression stockings, though patient finds it difficult to apply.   -Monitor for worsening edema.    Atrial Fibrillation   No recent issues reported.   -Continue current management with Eliquis.    Medication Cost   Patient reports high cost of Eliquis.   -If cost exceeds average of $54/month, consider consultation with clinical pharmacist for cost optimization.    Mitral Valve Regurgitation / Tricuspid valve regurgitation  Mild, noted on recent echocardiogram.   -Continue monitoring, no intervention needed at this time.    Hyperlipidemia   LDL at 102, acceptable for patient without documented coronary disease.   -Continue current management.    Constipation   Patient using suppositories and Metamucil to manage.   -Continue current regimen, avoid straining to prevent  increased pressure on aorta.    Follow-up   Schedule appointment in 6 months with Dr. Jacques Navy.   Signed, Sharlene Dory, PA-C

## 2023-06-19 DIAGNOSIS — I4891 Unspecified atrial fibrillation: Secondary | ICD-10-CM | POA: Diagnosis not present

## 2023-06-19 DIAGNOSIS — I10 Essential (primary) hypertension: Secondary | ICD-10-CM | POA: Diagnosis not present

## 2023-06-21 ENCOUNTER — Ambulatory Visit: Payer: Medicare Other | Admitting: Nurse Practitioner

## 2023-07-03 DIAGNOSIS — I10 Essential (primary) hypertension: Secondary | ICD-10-CM | POA: Diagnosis not present

## 2023-07-03 DIAGNOSIS — Z8701 Personal history of pneumonia (recurrent): Secondary | ICD-10-CM | POA: Diagnosis not present

## 2023-07-09 DIAGNOSIS — I10 Essential (primary) hypertension: Secondary | ICD-10-CM | POA: Diagnosis not present

## 2023-07-09 DIAGNOSIS — E039 Hypothyroidism, unspecified: Secondary | ICD-10-CM | POA: Diagnosis not present

## 2023-07-09 DIAGNOSIS — I4891 Unspecified atrial fibrillation: Secondary | ICD-10-CM | POA: Diagnosis not present

## 2023-07-09 DIAGNOSIS — E782 Mixed hyperlipidemia: Secondary | ICD-10-CM | POA: Diagnosis not present

## 2023-07-09 DIAGNOSIS — H409 Unspecified glaucoma: Secondary | ICD-10-CM | POA: Diagnosis not present

## 2023-07-16 ENCOUNTER — Other Ambulatory Visit: Payer: Self-pay

## 2023-07-16 DIAGNOSIS — I483 Typical atrial flutter: Secondary | ICD-10-CM

## 2023-07-16 MED ORDER — APIXABAN 5 MG PO TABS: 5.0000 mg | ORAL_TABLET | Freq: Two times a day (BID) | ORAL | 0 refills | Status: AC

## 2023-07-16 NOTE — Telephone Encounter (Signed)
 Prescription refill request for Eliquis received. Indication:AFIB Last office visit:3/25 HQI:ONGEX labs Age: 89 Weight:73.1  kg  Prescription refilled

## 2023-07-18 DIAGNOSIS — I4891 Unspecified atrial fibrillation: Secondary | ICD-10-CM | POA: Diagnosis not present

## 2023-07-18 DIAGNOSIS — I10 Essential (primary) hypertension: Secondary | ICD-10-CM | POA: Diagnosis not present

## 2023-08-08 DIAGNOSIS — E039 Hypothyroidism, unspecified: Secondary | ICD-10-CM | POA: Diagnosis not present

## 2023-08-08 DIAGNOSIS — I4891 Unspecified atrial fibrillation: Secondary | ICD-10-CM | POA: Diagnosis not present

## 2023-08-08 DIAGNOSIS — E782 Mixed hyperlipidemia: Secondary | ICD-10-CM | POA: Diagnosis not present

## 2023-08-08 DIAGNOSIS — I10 Essential (primary) hypertension: Secondary | ICD-10-CM | POA: Diagnosis not present

## 2023-08-08 DIAGNOSIS — H409 Unspecified glaucoma: Secondary | ICD-10-CM | POA: Diagnosis not present

## 2023-08-17 DIAGNOSIS — I4891 Unspecified atrial fibrillation: Secondary | ICD-10-CM | POA: Diagnosis not present

## 2023-08-17 DIAGNOSIS — I10 Essential (primary) hypertension: Secondary | ICD-10-CM | POA: Diagnosis not present

## 2023-08-29 ENCOUNTER — Inpatient Hospital Stay (HOSPITAL_COMMUNITY)

## 2023-08-29 ENCOUNTER — Emergency Department (HOSPITAL_COMMUNITY)

## 2023-08-29 ENCOUNTER — Other Ambulatory Visit: Payer: Self-pay

## 2023-08-29 ENCOUNTER — Inpatient Hospital Stay (HOSPITAL_COMMUNITY)
Admission: EM | Admit: 2023-08-29 | Discharge: 2023-09-01 | DRG: 064 | Disposition: A | Discharge status: Skilled Nursing Facility | Source: Skilled Nursing Facility | Attending: Internal Medicine | Admitting: Internal Medicine

## 2023-08-29 DIAGNOSIS — H5340 Unspecified visual field defects: Secondary | ICD-10-CM | POA: Diagnosis not present

## 2023-08-29 DIAGNOSIS — Z8249 Family history of ischemic heart disease and other diseases of the circulatory system: Secondary | ICD-10-CM

## 2023-08-29 DIAGNOSIS — L409 Psoriasis, unspecified: Secondary | ICD-10-CM | POA: Diagnosis present

## 2023-08-29 DIAGNOSIS — Z87891 Personal history of nicotine dependence: Secondary | ICD-10-CM | POA: Diagnosis not present

## 2023-08-29 DIAGNOSIS — R41841 Cognitive communication deficit: Secondary | ICD-10-CM | POA: Diagnosis not present

## 2023-08-29 DIAGNOSIS — K219 Gastro-esophageal reflux disease without esophagitis: Secondary | ICD-10-CM | POA: Diagnosis not present

## 2023-08-29 DIAGNOSIS — R410 Disorientation, unspecified: Secondary | ICD-10-CM | POA: Diagnosis not present

## 2023-08-29 DIAGNOSIS — E876 Hypokalemia: Secondary | ICD-10-CM | POA: Diagnosis present

## 2023-08-29 DIAGNOSIS — R29818 Other symptoms and signs involving the nervous system: Secondary | ICD-10-CM | POA: Diagnosis not present

## 2023-08-29 DIAGNOSIS — I480 Paroxysmal atrial fibrillation: Secondary | ICD-10-CM | POA: Diagnosis present

## 2023-08-29 DIAGNOSIS — I6389 Other cerebral infarction: Secondary | ICD-10-CM | POA: Diagnosis not present

## 2023-08-29 DIAGNOSIS — R29703 NIHSS score 3: Secondary | ICD-10-CM | POA: Diagnosis present

## 2023-08-29 DIAGNOSIS — G936 Cerebral edema: Secondary | ICD-10-CM | POA: Diagnosis present

## 2023-08-29 DIAGNOSIS — H409 Unspecified glaucoma: Secondary | ICD-10-CM | POA: Diagnosis present

## 2023-08-29 DIAGNOSIS — M6281 Muscle weakness (generalized): Secondary | ICD-10-CM | POA: Diagnosis not present

## 2023-08-29 DIAGNOSIS — Z7901 Long term (current) use of anticoagulants: Secondary | ICD-10-CM | POA: Diagnosis not present

## 2023-08-29 DIAGNOSIS — I672 Cerebral atherosclerosis: Secondary | ICD-10-CM | POA: Diagnosis not present

## 2023-08-29 DIAGNOSIS — I639 Cerebral infarction, unspecified: Secondary | ICD-10-CM | POA: Diagnosis not present

## 2023-08-29 DIAGNOSIS — I63511 Cerebral infarction due to unspecified occlusion or stenosis of right middle cerebral artery: Secondary | ICD-10-CM | POA: Diagnosis not present

## 2023-08-29 DIAGNOSIS — Z79890 Hormone replacement therapy (postmenopausal): Secondary | ICD-10-CM | POA: Diagnosis not present

## 2023-08-29 DIAGNOSIS — I10 Essential (primary) hypertension: Secondary | ICD-10-CM | POA: Diagnosis present

## 2023-08-29 DIAGNOSIS — I4892 Unspecified atrial flutter: Secondary | ICD-10-CM | POA: Diagnosis not present

## 2023-08-29 DIAGNOSIS — F29 Unspecified psychosis not due to a substance or known physiological condition: Secondary | ICD-10-CM | POA: Diagnosis not present

## 2023-08-29 DIAGNOSIS — I63531 Cerebral infarction due to unspecified occlusion or stenosis of right posterior cerebral artery: Secondary | ICD-10-CM | POA: Diagnosis present

## 2023-08-29 DIAGNOSIS — G319 Degenerative disease of nervous system, unspecified: Secondary | ICD-10-CM | POA: Diagnosis not present

## 2023-08-29 DIAGNOSIS — R442 Other hallucinations: Secondary | ICD-10-CM | POA: Diagnosis not present

## 2023-08-29 DIAGNOSIS — I4891 Unspecified atrial fibrillation: Secondary | ICD-10-CM | POA: Diagnosis not present

## 2023-08-29 DIAGNOSIS — Z79899 Other long term (current) drug therapy: Secondary | ICD-10-CM

## 2023-08-29 DIAGNOSIS — E039 Hypothyroidism, unspecified: Secondary | ICD-10-CM | POA: Diagnosis present

## 2023-08-29 DIAGNOSIS — I6782 Cerebral ischemia: Secondary | ICD-10-CM | POA: Diagnosis not present

## 2023-08-29 DIAGNOSIS — R2681 Unsteadiness on feet: Secondary | ICD-10-CM | POA: Diagnosis not present

## 2023-08-29 DIAGNOSIS — Z8673 Personal history of transient ischemic attack (TIA), and cerebral infarction without residual deficits: Secondary | ICD-10-CM | POA: Diagnosis present

## 2023-08-29 DIAGNOSIS — Z823 Family history of stroke: Secondary | ICD-10-CM | POA: Diagnosis not present

## 2023-08-29 DIAGNOSIS — H547 Unspecified visual loss: Secondary | ICD-10-CM | POA: Diagnosis present

## 2023-08-29 DIAGNOSIS — K5900 Constipation, unspecified: Secondary | ICD-10-CM | POA: Diagnosis present

## 2023-08-29 DIAGNOSIS — H5347 Heteronymous bilateral field defects: Secondary | ICD-10-CM | POA: Diagnosis not present

## 2023-08-29 DIAGNOSIS — I6523 Occlusion and stenosis of bilateral carotid arteries: Secondary | ICD-10-CM | POA: Diagnosis not present

## 2023-08-29 DIAGNOSIS — R4182 Altered mental status, unspecified: Secondary | ICD-10-CM | POA: Diagnosis not present

## 2023-08-29 DIAGNOSIS — E785 Hyperlipidemia, unspecified: Secondary | ICD-10-CM | POA: Diagnosis not present

## 2023-08-29 LAB — CBG MONITORING, ED: Glucose-Capillary: 101 mg/dL — ABNORMAL HIGH (ref 70–99)

## 2023-08-29 LAB — COMPREHENSIVE METABOLIC PANEL WITH GFR
ALT: 9 U/L (ref 0–44)
AST: 20 U/L (ref 15–41)
Albumin: 3.3 g/dL — ABNORMAL LOW (ref 3.5–5.0)
Alkaline Phosphatase: 53 U/L (ref 38–126)
Anion gap: 6 (ref 5–15)
BUN: 17 mg/dL (ref 8–23)
CO2: 22 mmol/L (ref 22–32)
Calcium: 7.6 mg/dL — ABNORMAL LOW (ref 8.9–10.3)
Chloride: 110 mmol/L (ref 98–111)
Creatinine, Ser: 0.71 mg/dL (ref 0.61–1.24)
GFR, Estimated: >60 mL/min (ref >60)
Glucose, Bld: 91 mg/dL (ref 70–99)
Potassium: 3.3 mmol/L — ABNORMAL LOW (ref 3.5–5.1)
Sodium: 138 mmol/L (ref 135–145)
Total Bilirubin: 1.1 mg/dL (ref 0.0–1.2)
Total Protein: 6.3 g/dL — ABNORMAL LOW (ref 6.5–8.1)

## 2023-08-29 LAB — CBC
HCT: 40.6 % (ref 39.0–52.0)
Hemoglobin: 13.5 g/dL (ref 13.0–17.0)
MCH: 31.8 pg (ref 26.0–34.0)
MCHC: 33.3 g/dL (ref 30.0–36.0)
MCV: 95.5 fL (ref 80.0–100.0)
Platelets: 180 10*3/uL (ref 150–400)
RBC: 4.25 MIL/uL (ref 4.22–5.81)
RDW: 13.2 % (ref 11.5–15.5)
WBC: 7.3 10*3/uL (ref 4.0–10.5)
nRBC: 0 % (ref 0.0–0.2)

## 2023-08-29 LAB — URINALYSIS, ROUTINE W REFLEX MICROSCOPIC
Bilirubin Urine: NEGATIVE
Glucose, UA: NEGATIVE mg/dL
Hgb urine dipstick: NEGATIVE
Ketones, ur: 5 mg/dL — AB
Leukocytes,Ua: NEGATIVE
Nitrite: NEGATIVE
Protein, ur: NEGATIVE mg/dL
Specific Gravity, Urine: 1.008 (ref 1.005–1.030)
pH: 7 (ref 5.0–8.0)

## 2023-08-29 LAB — HEMOGLOBIN A1C
Hgb A1c MFr Bld: 5 % (ref 4.8–5.6)
Mean Plasma Glucose: 96.8 mg/dL

## 2023-08-29 MED ORDER — STROKE: EARLY STAGES OF RECOVERY BOOK
Freq: Once | Status: AC
  Filled 2023-08-29: qty 1

## 2023-08-29 MED ORDER — BISACODYL 5 MG PO TBEC: 5.0000 mg | DELAYED_RELEASE_TABLET | Freq: Every day | ORAL | Status: DC | PRN

## 2023-08-29 MED ORDER — ACETAMINOPHEN 650 MG RE SUPP: 650.0000 mg | Freq: Four times a day (QID) | RECTAL | Status: DC | PRN

## 2023-08-29 MED ORDER — BRIMONIDINE TARTRATE-TIMOLOL 0.2-0.5 % OP SOLN: 1.0000 [drp] | Freq: Two times a day (BID) | OPHTHALMIC | Status: DC

## 2023-08-29 MED ORDER — LEVOTHYROXINE SODIUM 75 MCG PO TABS
75.0000 ug | ORAL_TABLET | Freq: Every day | ORAL | Status: DC
  Administered 2023-08-30 – 2023-09-01 (×3): 75 ug via ORAL
  Filled 2023-08-29 (×3): qty 1

## 2023-08-29 MED ORDER — ACETAMINOPHEN 325 MG PO TABS: 650.0000 mg | ORAL_TABLET | Freq: Four times a day (QID) | ORAL | Status: DC | PRN

## 2023-08-29 MED ORDER — ASPIRIN 325 MG PO TABS
325.0000 mg | ORAL_TABLET | Freq: Every day | ORAL | Status: DC
  Administered 2023-08-30: 325 mg via ORAL
  Filled 2023-08-29: qty 1

## 2023-08-29 MED ORDER — IOHEXOL 350 MG/ML SOLN
75.0000 mL | Freq: Once | INTRAVENOUS | Status: AC | PRN
  Administered 2023-08-29: 75 mL via INTRAVENOUS

## 2023-08-29 MED ORDER — ALBUTEROL SULFATE (2.5 MG/3ML) 0.083% IN NEBU: 2.5000 mg | INHALATION_SOLUTION | Freq: Four times a day (QID) | RESPIRATORY_TRACT | Status: DC | PRN

## 2023-08-29 MED ORDER — POLYETHYLENE GLYCOL 3350 17 G PO PACK: 17.0000 g | PACK | Freq: Every day | ORAL | Status: DC | PRN

## 2023-08-29 MED ORDER — PANTOPRAZOLE SODIUM 40 MG PO TBEC
40.0000 mg | DELAYED_RELEASE_TABLET | Freq: Every day | ORAL | Status: DC
  Administered 2023-08-30 – 2023-09-01 (×3): 40 mg via ORAL
  Filled 2023-08-29 (×3): qty 1

## 2023-08-29 NOTE — ED Notes (Signed)
 Urinal left at bedside patient aware that urine sample is needed, daughter at bedside, both verbalized understanding

## 2023-08-29 NOTE — ED Triage Notes (Signed)
 Patient to ED by EMS from [facility] for AMS. Per EMS he has been having frequent urination, confusion and hallucinations for a few days. EMS started 18g in L wrist, on Eliquis  no falls. 16080 70 96% RA CBG: 99

## 2023-08-29 NOTE — H&P (Signed)
 [facility] History and Physical  Neil [name] QIO:[id number] DOB: 01/18/1930 DOA: 08/29/2023 PCP: Rae Bugler, MD  Presented from: [facility] independent living facility Chief Complaint: Altered mental status  History of Present Illness: Neil [name] is a 89 y.o. male with PMH significant for HTN, paroxysmal A-fib on Eliquis , h/o psoriasis on Enbrel, hypothyroidism, glaucoma Patient lives at [facility] independent living facility.  At baseline, is able to walk without an assistive device.  Exercises 3 days a week. Patient was brought to the ED today with concern of hallucinations, visual loss.  Symptoms started about 3 days ago when he reported feeling strange shapes, animals around the house.  In the interval he also had difficulty reading from a piece of paper. Patient also reported urinary frequency for the past several days.  In the ED, patient was afebrile, heart rate in 60s, blood pressure in 160s Labs with CBC unremarkable, BMP with potassium of 3.3 Urinalysis unremarkable MRI brain showed 1. Large acute to early subacute right PCA infarct. 2. Small subacute infarct in the splenium of the corpus callosum on the left. 3. Moderate to severe chronic small vessel ischemic disease. 4. Chronic left temporal infarct.  Neurology Dr. Cleone Dad was called from the ED. Recommended inpatient stroke workup  At the time of my evaluation, patient was lying on bed.  Not in distress. Daughter at bedside.  History reviewed in detail as above.   Review of Systems:  All systems were reviewed and were negative unless otherwise mentioned in the HPI   Past medical history: Past Medical History:  Diagnosis Date   Glaucoma    Hypertension    Psoriasis     Past surgical history: Past Surgical History:  Procedure Laterality Date   GLAUCOMA SURGERY Left    HERNIA REPAIR     HYDROCELE EXCISION / REPAIR      Social History:  reports that he has quit smoking. His smoking  use included pipe. He has never been exposed to tobacco smoke. He has never used smokeless tobacco. He reports current alcohol use of about 7.0 standard drinks of alcohol per week. He reports that he does not use drugs.  Allergies:  No Known Allergies Patient has no known allergies.   Family history:  Family History  Problem Relation Age of Onset   High blood pressure Mother    Stroke Mother    High blood pressure Sister      Physical Exam: Vitals:   08/29/23 1230 08/29/23 1232 08/29/23 1400  BP: (!) 165/84  (!) 168/83  Pulse: 68  68  Resp: 16  16  Temp: 98.2 F (36.8 C)    TempSrc: Oral    SpO2: 100%  100%  Weight:  75 kg   Height:  6\' 1"  (1.854 m)    Wt Readings from Last 3 Encounters:  08/29/23 75 kg  06/13/23 73.1 kg  12/19/22 74.8 kg   Body mass index is 21.81 kg/m.  General exam: Pleasant, elderly Caucasian male. Skin: No rashes, lesions or ulcers. HEENT: Atraumatic, normocephalic, no obvious bleeding Lungs: Clear to auscultation bilaterally,  CVS: S1, S2, no murmur,   GI/Abd: Soft, nontender, nondistended, bowel sound present,   CNS: Alert, awake, hard of hearing but oriented x 3.   Psychiatry: Mood appropriate,  Extremities: Trace left pedal edema, no calf tenderness,    ----------------------------------------------------------------------------------------------------------------------------------------- ----------------------------------------------------------------------------------------------------------------------------------------- -----------------------------------------------------------------------------------------------------------------------------------------  Assessment/Plan: Principal Problem:   Acute stroke due to ischemia (HCC)  Acute, subacute and chronic stroke Presented with 3 days  of visual deficits, hallucinations while on Eliquis  MRI showed large subacute to early subacute PCA infarct.  Also showed small subacute infarct in  the corpus callosum of the left, chronic left temporal infarct. He seems to be having all forms of stroke - acute, subacute and chronic infarct. Reports compliance to Eliquis  twice daily. Neurology to formally see once patient is at [facility] For now, recommendation is to hold Eliquis .  Start aspirin 325 mg from tomorrow morning For urgent hypertension Stroke workup including CTA head and neck Lipid panel, TSH, A1c, echo  Paroxysmal A-fib Eliquis  on hold as above  Hypertension PTA meds- Norvasc  10 mg daily To keep on hold to allow permissive hypertension for now  h/o psoriasis  on Enbrel  Hypothyroidism Synthroid   Glaucoma   Mobility: PT eval  Goals of care:   Code Status: Full Code.  Discussed with patient and daughter at bedside   DVT prophylaxis:  SCDs Start: 08/29/23 1612   Antimicrobials: None Fluid: None Consultants: Neurology Family Communication: Daughter at bedside  Status: Inpatient Level of care:  Telemetry Medical   Patient is from: Independent living facility Anticipated d/c to: ILF versus ALF  Diet:  Diet Order             Diet NPO time specified  Diet effective now                    ------------------------------------------------------------------------------------- Severity of Illness: The appropriate patient status for this patient is INPATIENT. Inpatient status is judged to be reasonable and necessary in order to provide the required intensity of service to ensure the patient's safety. The patient's presenting symptoms, physical exam findings, and initial radiographic and laboratory data in the context of their chronic comorbidities is felt to place them at high risk for further clinical deterioration. Furthermore, it is not anticipated that the patient will be medically stable for discharge from the hospital within 2 midnights of admission.   * I certify that at the point of admission it is my clinical judgment that the patient will  require inpatient hospital care spanning beyond 2 midnights from the point of admission due to high intensity of service, high risk for further deterioration and high frequency of surveillance required.* -------------------------------------------------------------------------------------   Home Meds: Prior to Admission medications   Medication Sig Start Date End Date Taking? Authorizing Provider  amLODipine  (NORVASC ) 10 MG tablet Take 1 tablet (10 mg total) by mouth daily. 06/13/23   Von Grumbling, PA-C  apixaban  (ELIQUIS ) 5 MG TABS tablet Take 1 tablet (5 mg total) by mouth 2 (two) times daily. NEEDS LABS FOR ELIQUIS  REFILLS, COME TO OFFICE.  THANK YOU 07/16/23   Von Grumbling, PA-C  bimatoprost (LUMIGAN) 0.01 % SOLN Place 1 drop into the right eye at bedtime.    [provider]  brimonidine -timolol  (COMBIGAN ) 0.2-0.5 % ophthalmic solution Place 1 drop into the right eye 2 (two) times a day.    [provider]  dorzolamide (TRUSOPT) 2 % ophthalmic solution Place 1 drop into the right eye 2 (two) times daily. 04/17/19   [provider]  levothyroxine  (SYNTHROID ) 75 MCG tablet Take 75 mcg by mouth daily before breakfast.    [provider]  Multiple Vitamin (MULTIVITAMIN ADULT PO) Take 1 tablet by mouth daily.    [provider]  omeprazole (PRILOSEC) 40 MG capsule Take 40 mg by mouth as needed (heartburn, gerd, reflux).    [provider]  Psyllium (METAMUCIL 4 IN 1  FIBER) 55.6 % POWD 1 tablespoon mixed with water Orally at bedtime    [provider]  vitamin B-12 (CYANOCOBALAMIN ) 1000 MCG tablet Take 1,000 mcg by mouth daily.    [provider]    Labs on Admission:   CBC: Recent Labs  Lab 08/29/23 1240  WBC 7.3  HGB 13.5  HCT 40.6  MCV 95.5  PLT 180    Basic Metabolic Panel: Recent Labs  Lab 08/29/23 1240  NA 138  K 3.3*  CL 110  CO2 22  GLUCOSE 91  BUN 17  CREATININE 0.71  CALCIUM 7.6*    Liver  Function Tests: Recent Labs  Lab 08/29/23 1240  AST 20  ALT 9  ALKPHOS 53  BILITOT 1.1  PROT 6.3*  ALBUMIN 3.3*   No results for input(s): "LIPASE", "AMYLASE" in the last 168 hours. No results for input(s): "AMMONIA" in the last 168 hours.  Cardiac Enzymes: No results for input(s): "CKTOTAL", "CKMB", "CKMBINDEX", "TROPONINI" in the last 168 hours.  BNP (last 3 results) No results for input(s): "BNP" in the last 8760 hours.  ProBNP (last 3 results) No results for input(s): "PROBNP" in the last 8760 hours.  CBG: Recent Labs  Lab 08/29/23 1233  GLUCAP 101*    Lipase  No results found for: "LIPASE"   Urinalysis    Component Value Date/Time   COLORURINE STRAW (A) 08/29/2023 1240   APPEARANCEUR CLEAR 08/29/2023 1240   LABSPEC 1.008 08/29/2023 1240   PHURINE 7.0 08/29/2023 1240   GLUCOSEU NEGATIVE 08/29/2023 1240   HGBUR NEGATIVE 08/29/2023 1240   BILIRUBINUR NEGATIVE 08/29/2023 1240   KETONESUR 5 (A) 08/29/2023 1240   PROTEINUR NEGATIVE 08/29/2023 1240   NITRITE NEGATIVE 08/29/2023 1240   LEUKOCYTESUR NEGATIVE 08/29/2023 1240     Drugs of Abuse  No results found for: "LABOPIA", "COCAINSCRNUR", "LABBENZ", "AMPHETMU", "THCU", "LABBARB"    Radiological Exams on Admission: MR BRAIN WO CONTRAST Result Date: 08/29/2023 CLINICAL DATA:  Neuro deficit, acute, stroke suspected. Left sided hemianopsia, visual hallucinations. EXAM: MRI HEAD WITHOUT CONTRAST TECHNIQUE: Multiplanar, multiecho pulse sequences of the brain and surrounding structures were obtained without intravenous contrast. COMPARISON:  None Available. FINDINGS: Brain: There is a large acute to early subacute right PCA infarct involving the inferior right occipital and posterior temporal lobes. There is also a 1 cm subacute infarct involving the left lateral aspect of the splenium of the corpus callosum. There is well-defined cytotoxic edema associated with the right PCA infarct without significant mass effect. No  intracranial hemorrhage, midline shift, hydrocephalus, or extra-axial fluid collection is identified. A small chronic infarct is noted in the posterolateral left temporal lobe, and there is also a tiny chronic cortical infarct in the left precentral gyrus. Patchy to confluent T2 hyperintensities elsewhere in cerebral white matter bilaterally are nonspecific but compatible with moderate to severe chronic small vessel ischemic disease. There are small chronic bilateral cerebellar infarcts. There is moderate cerebral atrophy. Vascular: Major intracranial vascular flow voids are preserved. Skull and upper cervical spine: Unremarkable bone marrow signal. Sinuses/Orbits: Bilateral cataract extraction. Minimal mucosal thickening in the left maxillary sinus. Clear mastoid air cells. Other: None. These results were called by telephone at the time of interpretation on 08/29/2023 at 3:41 pm to Dr. Jerald Molly , who verbally acknowledged these results. IMPRESSION: 1. Large acute to early subacute right PCA infarct. 2. Small subacute infarct in the splenium of the corpus callosum on the left. 3. Moderate to severe chronic small vessel ischemic disease. 4. Chronic left  temporal infarct. Electronically Signed   By: Aundra Lee M.D.   On: 08/29/2023 15:45     Signed, Hoyt Macleod, MD [facility] 08/29/2023

## 2023-08-29 NOTE — Progress Notes (Signed)
 Patient passed bedside swallow screen.  Diet ordered.  Celie Desrochers, MD [facility] 08/29/2023, 8:37 PM

## 2023-08-29 NOTE — ED Notes (Signed)
Pt passed swallow screening.

## 2023-08-29 NOTE — ED Provider Notes (Signed)
 [facility] EMERGENC[facility] DEPARTMENT AT [facility] Provider Note   CSN: [id number] Arrival date & time: 08/29/23  1219     History  Chief Complaint  Patient presents with   Altered Mental Status    Neil [name] is a 89 y.o. male presenting to ED in the company of his daughter with concern for hallucinations and visual loss.  Patient has a history of A-fib and is on Eliquis .  He reports that about 3 days ago began having visual elucidation's, reporting that he sees "mostly animals but other strange things like shapes" around the house, the daytime and at night.  He denies any auditory hallucinations.  He denies any visual loss himself but his daughter reports that she called him 2 days ago and tried to have him read off a piece of paper but he was not able to do so.  Patient denies any falls.  Denies headaches.  Denies prior history of stroke.  He lives in independent living at [facility]  He does report urinary frequency for the past several days which she says is abnormal for him.  Denies history of UTIs.  Denies any ambulatory difficulty  HPI     Home Medications Prior to Admission medications   Medication Sig Start Date End Date Taking? Authorizing Provider  amLODipine  (NORVASC ) 10 MG tablet Take 1 tablet (10 mg total) by mouth daily. 06/13/23   Von Grumbling, PA-C  apixaban  (ELIQUIS ) 5 MG TABS tablet Take 1 tablet (5 mg total) by mouth 2 (two) times daily. NEEDS LABS FOR ELIQUIS  REFILLS, COME TO OFFICE.  THANK YOU 07/16/23   Von Grumbling, PA-C  bimatoprost (LUMIGAN) 0.01 % SOLN Place 1 drop into the right eye at bedtime.    [provider]  brimonidine -timolol  (COMBIGAN ) 0.2-0.5 % ophthalmic solution Place 1 drop into the right eye 2 (two) times a day.    [provider]  dorzolamide (TRUSOPT) 2 % ophthalmic solution Place 1 drop into the right eye 2 (two) times daily. 04/17/19   [provider]  levothyroxine  (SYNTHROID ) 75 MCG tablet Take 75 mcg by  mouth daily before breakfast.    [provider]  Multiple Vitamin (MULTIVITAMIN ADULT PO) Take 1 tablet by mouth daily.    [provider]  omeprazole (PRILOSEC) 40 MG capsule Take 40 mg by mouth as needed (heartburn, gerd, reflux).    [provider]  Psyllium (METAMUCIL 4 IN 1 FIBER) 55.6 % POWD 1 tablespoon mixed with water Orally at bedtime    [provider]  vitamin B-12 (CYANOCOBALAMIN ) 1000 MCG tablet Take 1,000 mcg by mouth daily.    [provider]      Allergies    Patient has no known allergies.    Review of Systems   Review of Systems  Physical Exam Updated Vital Signs BP (!) 168/83   Pulse 68   Temp 98.2 F (36.8 C) (Oral)   Resp 16   Ht 6\' 1"  (1.854 m)   Wt 75 kg   SpO2 100%   BMI 21.81 kg/m  Physical Exam Constitutional:      General: He is not in acute distress. HENT:     Head: Normocephalic and atraumatic.  Eyes:     Conjunctiva/sclera: Conjunctivae normal.     Pupils: Pupils are equal, round, and reactive to light.  Cardiovascular:     Rate and Rhythm: Normal rate and regular rhythm.  Pulmonary:     Effort: Pulmonary effort is normal.  No respiratory distress.  Abdominal:     General: There is no distension.     Tenderness: There is no abdominal tenderness.  Skin:    General: Skin is warm and dry.  Neurological:     Mental Status: He is alert.     Comments: Left sided hemianopsia, no other evident cranial nerve deficits, speech is clear, no abnormal pronator drift  Psychiatric:        Mood and Affect: Mood normal.        Behavior: Behavior normal.     ED Results / Procedures / Treatments   Labs (all labs ordered are listed, but only abnormal results are displayed) Labs Reviewed  COMPREHENSIVE METABOLIC PANEL WITH GFR - Abnormal; Notable for the following components:      Result Value   Potassium 3.3 (*)    Calcium 7.6 (*)    Total Protein 6.3 (*)    Albumin 3.3 (*)    All other components  within normal limits  URINALYSIS, ROUTINE W REFLEX MICROSCOPIC - Abnormal; Notable for the following components:   Color, Urine STRAW (*)    Ketones, ur 5 (*)    All other components within normal limits  CBG MONITORING, ED - Abnormal; Notable for the following components:   Glucose-Capillary 101 (*)    All other components within normal limits  CBC    EKG EKG Interpretation Date/Time:  Wednesday Aug 29 2023 12:28:28 EDT Ventricular Rate:  64 PR Interval:  136 QRS Duration:  97 QT Interval:  453 QTC Calculation: 468 R Axis:   9  Text Interpretation: Sinus rhythm Repol abnrm suggests ischemia, lateral leads Confirmed by Jerald Molly [phone]) on 08/29/2023 1:03:33 PM  Radiology MR BRAIN WO CONTRAST Result Date: 08/29/2023 CLINICAL DATA:  Neuro deficit, acute, stroke suspected. Left sided hemianopsia, visual hallucinations. EXAM: MRI HEAD WITHOUT CONTRAST TECHNIQUE: Multiplanar, multiecho pulse sequences of the brain and surrounding structures were obtained without intravenous contrast. COMPARISON:  None Available. FINDINGS: Brain: There is a large acute to early subacute right PCA infarct involving the inferior right occipital and posterior temporal lobes. There is also a 1 cm subacute infarct involving the left lateral aspect of the splenium of the corpus callosum. There is well-defined cytotoxic edema associated with the right PCA infarct without significant mass effect. No intracranial hemorrhage, midline shift, hydrocephalus, or extra-axial fluid collection is identified. A small chronic infarct is noted in the posterolateral left temporal lobe, and there is also a tiny chronic cortical infarct in the left precentral gyrus. Patchy to confluent T2 hyperintensities elsewhere in cerebral white matter bilaterally are nonspecific but compatible with moderate to severe chronic small vessel ischemic disease. There are small chronic bilateral cerebellar infarcts. There is moderate cerebral  atrophy. Vascular: Major intracranial vascular flow voids are preserved. Skull and upper cervical spine: Unremarkable bone marrow signal. Sinuses/Orbits: Bilateral cataract extraction. Minimal mucosal thickening in the left maxillary sinus. Clear mastoid air cells. Other: None. These results were called by telephone at the time of interpretation on 08/29/2023 at 3:41 pm to Dr. Jerald Molly , who verbally acknowledged these results. IMPRESSION: 1. Large acute to early subacute right PCA infarct. 2. Small subacute infarct in the splenium of the corpus callosum on the left. 3. Moderate to severe chronic small vessel ischemic disease. 4. Chronic left temporal infarct. Electronically Signed   By: Aundra Lee M.D.   On: 08/29/2023 15:45    Procedures Procedures    Medications Ordered in ED Medications - No data to  display  ED Course/ Medical Decision Making/ A&P Clinical Course as of 08/29/23 1553  Wed Aug 29, 2023  1547 Likely PCA infarct on MRI per discussion with radiologist (initial read).  Pt took eliquis  this morning.  Unlikely to be candidate for intervention; will discuss with neurology and admit.  Pt updated with his daughter at bedside [MT]  1550 Dr Cleone Dad recommending admission and transfer to [facility]; neuro to consult on patient in-person to determine need for antiplatelet therapy later, maybe start tomorrow, but HOLD eliquis  today.   [MT]    Clinical Course User Index [MT] Lateisha Thurlow, Janalyn Me, MD                                 Medical Decision Making Amount and/or Complexity of Data Reviewed Labs: ordered. Radiology: ordered.  Risk Decision regarding hospitalization.   This patient presents to the ED with concern for visual hallucinations and partial visual loss. This involves an extensive number of treatment options, and is a complaint that carries with it a high risk of complications and morbidity.  The differential diagnosis includes CVA versus other brain lesion vs  UTI  Co-morbidities that complicate the patient evaluation: Advanced age, high blood pressure, risk factors for cardiovascular disease and stroke  Additional history obtained from patient's daughter at bedside  Patient confirms he does not have a pacemaker, or any metal implants in his body.  He thinks he has had an MRI in the past but I do not have any records in our system.  His daughter at bedside confirms his statement.  I ordered and personally interpreted labs.  The pertinent results include: No emergent findings, mild hypokalemia  I ordered imaging studies including MRI brain I independently visualized and interpreted imaging which showed subacute infarct in PCA territory right-sided I agree with the radiologist interpretation  The patient was maintained on a cardiac monitor.  I personally viewed and interpreted the cardiac monitored which showed an underlying rhythm of: Sinus rhythm  Per my interpretation the patient's ECG shows sinus rhythm no acute ischemic findings  I have reviewed the patients home medicines and have made adjustments as needed  Test Considered: Doubt acute meningitis, no indication for LP.  Doubt demyelinating disease.  Doubt sepsis, PE, ACS  I requested consultation with the neurology, and discussed lab and imaging findings as well as pertinent plan - they recommend: see ed course  After the interventions noted above, I reevaluated the patient and found that they have: stayed the same   Disposition:  After consideration of the diagnostic results and the patients response to treatment, I feel that the patent would benefit from medical admission.  Admission page placed and Dr Leighton Punches EDP will follow up with hospitalist for admission.         Final Clinical Impression(s) / ED Diagnoses Final diagnoses:  Cerebrovascular accident (CVA), unspecified mechanism [facility])    Rx / DC Orders ED Discharge Orders     None         Blaklee Shores, Janalyn Me,  MD 08/29/23 1553

## 2023-08-29 NOTE — Plan of Care (Addendum)
 Discussed with Dr. Gordon Latus  To be seen by neurology on arrival to [facility] please notify on arrival   Acute right PCA stroke on MRI  Hold Eliquis  pending full eval though may decide safe to continue after full evaluation (symptom discovery 3 days ago based on onset of visual hallucinations then and has been tolerating eliquis  w/o hemorrhagic transformation per report, however subacute stroke in the left hemisphere as well concerning for intermittent missed doses)  For now schedule ASA 325 for tomorrow morning, and utilize standard stroke orderset otherwise.  Okay for permissive hypertension for now pending full eval and timeline clarification CTA head and neck if renal function allows   Baldwin Levee MD-PhD [facility] [phone] Available 7 AM to 7 PM, outside these hours please contact Neurologist on call listed on AMION

## 2023-08-30 ENCOUNTER — Inpatient Hospital Stay (HOSPITAL_COMMUNITY)

## 2023-08-30 ENCOUNTER — Encounter (HOSPITAL_COMMUNITY): Payer: Self-pay | Admitting: Internal Medicine

## 2023-08-30 DIAGNOSIS — I63531 Cerebral infarction due to unspecified occlusion or stenosis of right posterior cerebral artery: Secondary | ICD-10-CM

## 2023-08-30 DIAGNOSIS — I6389 Other cerebral infarction: Secondary | ICD-10-CM | POA: Diagnosis not present

## 2023-08-30 DIAGNOSIS — E785 Hyperlipidemia, unspecified: Secondary | ICD-10-CM

## 2023-08-30 DIAGNOSIS — I639 Cerebral infarction, unspecified: Secondary | ICD-10-CM | POA: Diagnosis not present

## 2023-08-30 DIAGNOSIS — I4891 Unspecified atrial fibrillation: Secondary | ICD-10-CM | POA: Diagnosis not present

## 2023-08-30 LAB — ECHOCARDIOGRAM COMPLETE
AR max vel: 2.66 cm2
AV Peak grad: 4.7 mmHg
Ao pk vel: 1.08 m/s
Area-P 1/2: 2.38 cm2
Height: 73 in
MV VTI: 2.68 cm2
S' Lateral: 3.3 cm
Weight: 2645.52 [oz_av]

## 2023-08-30 LAB — CBC
HCT: 40 % (ref 39.0–52.0)
Hemoglobin: 13.4 g/dL (ref 13.0–17.0)
MCH: 31.4 pg (ref 26.0–34.0)
MCHC: 33.5 g/dL (ref 30.0–36.0)
MCV: 93.7 fL (ref 80.0–100.0)
Platelets: 180 10*3/uL (ref 150–400)
RBC: 4.27 MIL/uL (ref 4.22–5.81)
RDW: 13.1 % (ref 11.5–15.5)
WBC: 7.4 10*3/uL (ref 4.0–10.5)
nRBC: 0 % (ref 0.0–0.2)

## 2023-08-30 LAB — BASIC METABOLIC PANEL WITH GFR
Anion gap: 7 (ref 5–15)
BUN: 16 mg/dL (ref 8–23)
CO2: 25 mmol/L (ref 22–32)
Calcium: 8.6 mg/dL — ABNORMAL LOW (ref 8.9–10.3)
Chloride: 101 mmol/L (ref 98–111)
Creatinine, Ser: 0.85 mg/dL (ref 0.61–1.24)
GFR, Estimated: >60 mL/min (ref >60)
Glucose, Bld: 95 mg/dL (ref 70–99)
Potassium: 3.1 mmol/L — ABNORMAL LOW (ref 3.5–5.1)
Sodium: 133 mmol/L — ABNORMAL LOW (ref 135–145)

## 2023-08-30 LAB — LIPID PANEL
Cholesterol: 173 mg/dL (ref 0–200)
HDL: 47 mg/dL (ref >40)
LDL Cholesterol: 111 mg/dL — ABNORMAL HIGH (ref 0–99)
Total CHOL/HDL Ratio: 3.7 ratio
Triglycerides: 74 mg/dL (ref <150)
VLDL: 15 mg/dL (ref 0–40)

## 2023-08-30 LAB — TSH: TSH: 2.771 u[IU]/mL (ref 0.350–4.500)

## 2023-08-30 MED ORDER — POTASSIUM CHLORIDE 20 MEQ PO PACK
40.0000 meq | PACK | Freq: Once | ORAL | Status: AC
  Administered 2023-08-30: 40 meq via ORAL
  Filled 2023-08-30: qty 2

## 2023-08-30 MED ORDER — HALOPERIDOL LACTATE 5 MG/ML IJ SOLN: 1.0000 mg | Freq: Four times a day (QID) | INTRAMUSCULAR | Status: DC | PRN

## 2023-08-30 MED ORDER — APIXABAN 5 MG PO TABS
5.0000 mg | ORAL_TABLET | Freq: Two times a day (BID) | ORAL | Status: DC
  Administered 2023-08-31 – 2023-09-01 (×3): 5 mg via ORAL
  Filled 2023-08-30 (×3): qty 1

## 2023-08-30 MED ORDER — BRIMONIDINE TARTRATE 0.2 % OP SOLN
1.0000 [drp] | Freq: Two times a day (BID) | OPHTHALMIC | Status: DC
  Administered 2023-08-30 – 2023-09-01 (×4): 1 [drp] via OPHTHALMIC
  Filled 2023-08-30 (×2): qty 5

## 2023-08-30 MED ORDER — POTASSIUM CHLORIDE CRYS ER 20 MEQ PO TBCR: 40.0000 meq | EXTENDED_RELEASE_TABLET | Freq: Once | ORAL | Status: DC

## 2023-08-30 MED ORDER — OLANZAPINE 2.5 MG PO TABS
5.0000 mg | ORAL_TABLET | Freq: Every day | ORAL | Status: DC
  Administered 2023-08-30 – 2023-08-31 (×2): 5 mg via ORAL
  Filled 2023-08-30 (×2): qty 2

## 2023-08-30 MED ORDER — ATORVASTATIN CALCIUM 40 MG PO TABS
40.0000 mg | ORAL_TABLET | Freq: Every day | ORAL | Status: DC
  Administered 2023-08-30 – 2023-08-31 (×2): 40 mg via ORAL
  Filled 2023-08-30 (×2): qty 1

## 2023-08-30 MED ORDER — TIMOLOL MALEATE 0.5 % OP SOLN
1.0000 [drp] | Freq: Two times a day (BID) | OPHTHALMIC | Status: DC
Start: 2023-08-30 — End: 2023-09-01
  Administered 2023-08-30 – 2023-09-01 (×4): 1 [drp] via OPHTHALMIC
  Filled 2023-08-30 (×2): qty 5

## 2023-08-30 NOTE — Evaluation (Signed)
 Physical Therapy Evaluation Patient Details Name: Neil [name] MRN: [id number] DOB: [birth year] Today's Date: 08/30/2023  History of Present Illness  Neil [name] is a 89 y.o. male who was admitted from [facility] with increased confusion, urinary frequency, and hallucinations for 3 days. Admitted to [facility] with dx of acute R PCA infarct with small subacute infarct of corpus collosum, chronic L temporal infarct. PMH significant for HTN, paroxysmal A-fib on Eliquis , h/o psoriasis on Enbrel, hypothyroidism, glaucoma  Clinical Impression  Pt admitted with above diagnosis.  Pt currently with functional limitations due to the deficits listed below (see PT Problem List). Pt will benefit from acute skilled PT to increase their independence and safety with mobility to allow discharge.    The patient ambulated x 120' using a RW, noted mild balance loss at times. Patient lives alone in [place] facility and uses a  Rollator.  Patient's family present. Patient will benefit from continued inpatient follow up therapy, <3 hours/day          If plan is discharge home, recommend the following: A little help with walking and/or transfers;Assistance with cooking/housework;Help with stairs or ramp for entrance;Assist for transportation;A little help with bathing/dressing/bathroom   Can travel by private vehicle   Yes    Equipment Recommendations None recommended by PT  Recommendations for Other Services       Functional Status Assessment Patient has had a recent decline in their functional status and demonstrates the ability to make significant improvements in function in a reasonable and predictable amount of time.     Precautions / Restrictions Precautions Precautions: Fall Restrictions Weight Bearing Restrictions Per Provider Order: No      Mobility  Bed Mobility   Bed Mobility: Supine to Sit, Sit to Supine     Supine to sit: Contact guard, HOB elevated, Used rails Sit to supine: Contact  guard assist, HOB elevated, Used rails        Transfers Overall transfer level: Needs assistance Equipment used: Rolling walker (2 wheels) Transfers: Sit to/from Stand, Bed to chair/wheelchair/BSC Sit to Stand: Min assist, From elevated surface, Contact guard assist                Ambulation/Gait Ambulation/Gait assistance: Min assist Gait Distance (Feet): 120 Feet Assistive device: Rolling walker (2 wheels) Gait Pattern/deviations: Step-through pattern Gait velocity: decr     General Gait Details: slight drift to the left  Stairs            Wheelchair Mobility     Tilt Bed    Modified Rankin (Stroke Patients Only)       Balance Overall balance assessment: Needs assistance Sitting-balance support: Feet supported Sitting balance-Leahy Scale: Good     Standing balance support: During functional activity, Bilateral upper extremity supported Standing balance-Leahy Scale: Fair Standing balance comment: with AD                             Pertinent Vitals/Pain Pain Assessment Pain Assessment: No/denies pain    Home Living Family/patient expects to be discharged to:: Private residence Living Arrangements: Alone   Type of Home: Independent living facility Home Access: Level entry       Home Layout: One level Home Equipment: Rollator (4 wheels) Additional Comments: reported he stood for shower and was independent with rollator use    Prior Function Prior Level of Function : Independent/Modified Independent  Mobility Comments: rollator to attend 3x per week exercise class ADLs Comments: indepedent including standing for shower     Extremity/Trunk Assessment   Upper Extremity Assessment Upper Extremity Assessment: Defer to OT evaluation    Lower Extremity Assessment Lower Extremity Assessment: Overall WFL for tasks assessed    Cervical / Trunk Assessment Cervical / Trunk Assessment: Normal  Communication    Communication Communication: No apparent difficulties    Cognition Arousal: Alert Behavior During Therapy: WFL for tasks assessed/performed                             Following commands: Intact       Cueing Cueing Techniques: Verbal cues, Visual cues     General Comments      Exercises     Assessment/Plan    PT Assessment Patient needs continued PT services  PT Problem List Decreased strength;Decreased activity tolerance;Decreased mobility;Decreased coordination       PT Treatment Interventions DME instruction;Therapeutic exercise;Gait training;Functional mobility training;Therapeutic activities;Patient/family education    PT Goals (Current goals can be found in the Care Plan section)  Acute Rehab PT Goals Patient Stated Goal: go home PT Goal Formulation: With patient/family Time For Goal Achievement: 09/13/23 Potential to Achieve Goals: Good    Frequency Min 2X/week     Co-evaluation PT/OT/SLP Co-Evaluation/Treatment: Yes Reason for Co-Treatment: For patient/therapist safety;To address functional/ADL transfers   OT goals addressed during session: ADL's and self-care       AM-PAC PT "6 Clicks" Mobility  Outcome Measure Help needed turning from your back to your side while in a flat bed without using bedrails?: None Help needed moving from lying on your back to sitting on the side of a flat bed without using bedrails?: None Help needed moving to and from a bed to a chair (including a wheelchair)?: A Little Help needed standing up from a chair using your arms (e.g., wheelchair or bedside chair)?: A Little Help needed to walk in hospital room?: A Little Help needed climbing 3-5 steps with a railing? : A Lot 6 Click Score: 19    End of Session Equipment Utilized During Treatment: Gait belt Activity Tolerance: Patient tolerated treatment well Patient left: in bed;with call bell/phone within reach;with family/visitor present Nurse Communication:  Mobility status PT Visit Diagnosis: Unsteadiness on feet (R26.81);Difficulty in walking, not elsewhere classified (R26.2);Other symptoms and signs involving the nervous system (R29.898)    Time: 2952-8413 PT Time Calculation (min) (ACUTE ONLY): 19 min   Charges:   PT Evaluation $PT Eval Low Complexity: 1 Low   PT General Charges $$ ACUTE PT VISIT: 1 Visit         Abelina Hoes PT Acute Rehabilitation Services Office [phone]   Dareen Ebbing 08/30/2023, 2:00 PM

## 2023-08-30 NOTE — Evaluation (Signed)
 SLP Cancellation Note  Patient Details Name: Neil [name] MRN: [id number] DOB: Aug 13, 1929   Cancelled treatment:       Reason Eval/Treat Not Completed: Other (comment) (pt worked with OT/PT today, for transfer to [facility] per RN, will continue efforts for speech/cog evaluation. Thanks.)  Maudie Sorrow, MS [facility] SLP Acute Rehab Services Office [phone]   Chantal Comment 08/30/2023, 11:35 AM

## 2023-08-30 NOTE — ED Notes (Signed)
 Report given.

## 2023-08-30 NOTE — Evaluation (Signed)
 Occupational Therapy Evaluation Patient Details Name: Neil [name] MRN: [id number] DOB: [birth year] Today's Date: 08/30/2023   History of Present Illness   Neil [name] is a 89 y.o. male who was admitted from [facility] ILF with increased confusion, urinary frequency, and hallucinations for 3 days. Admitted to [facility] with dx of acute R PCA infarct with small subacute infarct of corpus collosum, chronic L temporal infarct. PMH significant for HTN, paroxysmal A-fib on Eliquis , h/o psoriasis on Enbrel, hypothyroidism, glaucoma     Clinical Impressions PTA, patient lived alone in ILF portion of [facility] and was relatively independent/mod I using rollator and attending 3x per week fitness classes. Currently, patient presents with deficits listed below (see OT Problem List for details) most significantly generalized muscle weakness (L>R as per patient report), and decreased motor planning, balance, activity tolerance and mild cog/visual processing skills impacting BADL's (min A overall) and functional mobility (min A overall) performance. Patient requires continued Acute hospital setting skilled OT services to progress safety and function and allow for discharge. Patient will benefit from continued inpatient follow up therapy, <3 hours/day.       If plan is discharge home, recommend the following:   A little help with walking and/or transfers;A little help with bathing/dressing/bathroom;Assistance with cooking/housework;Direct supervision/assist for medications management;Direct supervision/assist for financial management;Assist for transportation;Help with stairs or ramp for entrance;Supervision due to cognitive status     Functional Status Assessment   Patient has had a recent decline in their functional status and demonstrates the ability to make significant improvements in function in a reasonable and predictable amount of time.     Equipment Recommendations   Other  (comment) (TBD after rehab venue)      Precautions/Restrictions   Precautions Precautions: Fall Recall of Precautions/Restrictions: Intact Restrictions Weight Bearing Restrictions Per Provider Order: No     Mobility Bed Mobility Overal bed mobility: Needs Assistance Bed Mobility: Supine to Sit, Sit to Supine     Supine to sit: Contact guard, HOB elevated, Used rails Sit to supine: Contact guard assist, HOB elevated, Used rails        Transfers Overall transfer level: Needs assistance Equipment used: Rolling walker (2 wheels) Transfers: Sit to/from Stand, Bed to chair/wheelchair/BSC Sit to Stand: Min assist, From elevated surface     Step pivot transfers: Min assist, From elevated surface     General transfer comment: RW (see for PT eval for distance) with min A and cues for motor planning and balance recovery      Balance Overall balance assessment: Needs assistance Sitting-balance support: Feet supported Sitting balance-Leahy Scale: Good     Standing balance support: During functional activity, Bilateral upper extremity supported Standing balance-Leahy Scale: Poor (appoaching Fair)                             ADL either performed or assessed with clinical judgement   ADL Overall ADL's : Needs assistance/impaired Eating/Feeding: Set up;Bed level   Grooming: Set up;Sitting   Upper Body Bathing: Set up;Sitting   Lower Body Bathing: Contact guard assist;Sitting/lateral leans   Upper Body Dressing : Contact guard assist;Sitting   Lower Body Dressing: Minimal assistance;Sit to/from stand   Toilet Transfer: Minimal assistance;Rolling walker (2 wheels)   Toileting- Clothing Manipulation and Hygiene: Contact guard assist       Functional mobility during ADLs: Minimal assistance;Rolling walker (2 wheels) General ADL Comments: set up and cues for sequencing  Vision Baseline Vision/History: 1 Wears glasses Ability to See in Adequate  Light: 0 Adequate Patient Visual Report: No change from baseline;Other (comment) (reports R eye pupil is not reactive to light as per baseline) Vision Assessment?: Yes;No apparent visual deficits;Wears glasses for reading Additional Comments: gross confrontation test for VF's WFL's     Perception Perception: Impaired Preception Impairment Details: Spatial orientation Perception-Other Comments: mild decreased visual attention on R side noted   Praxis Praxis: Impaired Praxis Impairment Details: Motor planning Praxis-Other Comments: mildly impaired with RW navigation   Pertinent Vitals/Pain Pain Assessment Pain Assessment: No/denies pain     Extremity/Trunk Assessment Upper Extremity Assessment Upper Extremity Assessment: Right hand dominant;Generalized weakness   Lower Extremity Assessment Lower Extremity Assessment: Generalized weakness   Cervical / Trunk Assessment Cervical / Trunk Assessment: Normal   Communication Communication Communication: No apparent difficulties   Cognition Arousal: Alert Behavior During Therapy: WFL for tasks assessed/performed Cognition: No apparent impairments             OT - Cognition Comments: other than minor delay in overall processing and divided attention in busy ED, patient's cognition appears intact                 Following commands: Intact       Cueing  General Comments   Cueing Techniques: Verbal cues;Visual cues  VSS throughout: pre-BP 156/84, HR 67 RR 15, SpO2 100% on RA; post- 160/97; HR 67, RR 18, SpO2 98% on RA ,           Home Living Family/patient expects to be discharged to:: Private residence Living Arrangements: Alone Available Help at Discharge: Family;Other (Comment) ([facility] IL[facility]) Type of Home: Independent living facility Home Access: Level entry     Home Layout: One level     Bathroom Shower/Tub: Chief Strategy Officer: Standard Bathroom Accessibility:  (unkown)   Home  Equipment: Rollator (4 wheels)   Additional Comments: reported he stood for shower and was independent with rollator use      Prior Functioning/Environment Prior Level of Function : Independent/Modified Independent             Mobility Comments: rollator to attend 3x per week exercise class ADLs Comments: indepedent including standing for shower    OT Problem List: Decreased strength;Decreased activity tolerance;Impaired balance (sitting and/or standing);Impaired vision/perception;Decreased coordination;Decreased cognition;Decreased safety awareness   OT Treatment/Interventions: Therapeutic exercise;Self-care/ADL training;Neuromuscular education;Energy conservation;DME and/or AE instruction;Therapeutic activities;Cognitive remediation/compensation;Visual/perceptual remediation/compensation;Patient/family education;Balance training      OT Goals(Current goals can be found in the care plan section)   Acute Rehab OT Goals Patient Stated Goal: to get some therapy OT Goal Formulation: With patient/family Time For Goal Achievement: 09/13/23 Potential to Achieve Goals: Good ADL Goals Pt Will Perform Lower Body Bathing: with supervision;sitting/lateral leans;sit to/from stand Pt Will Perform Lower Body Dressing: with supervision;sitting/lateral leans;sit to/from stand Pt Will Transfer to Toilet: with supervision Pt Will Perform Toileting - Clothing Manipulation and hygiene: with supervision Pt Will Perform Tub/Shower Transfer: with contact guard assist;rolling walker;shower seat;ambulating Pt/caregiver will Perform Home Exercise Program: Increased strength;Both right and left upper extremity;With written HEP provided;Independently   OT Frequency:  Min 2X/week    Co-evaluation PT/OT/SLP Co-Evaluation/Treatment: Yes Reason for Co-Treatment: For patient/therapist safety;To address functional/ADL transfers PT goals addressed during session: Mobility/safety with mobility;Balance OT  goals addressed during session: ADL's and self-care      AM-PAC OT "6 Clicks" Daily Activity     Outcome Measure Help from another person eating meals?:  A Little Help from another person taking care of personal grooming?: A Little Help from another person toileting, which includes using toliet, bedpan, or urinal?: A Little Help from another person bathing (including washing, rinsing, drying)?: A Little Help from another person to put on and taking off regular upper body clothing?: A Little Help from another person to put on and taking off regular lower body clothing?: A Little 6 Click Score: 18   End of Session Equipment Utilized During Treatment: Gait belt;Rolling walker (2 wheels) Nurse Communication: Mobility status  Activity Tolerance: Patient tolerated treatment well Patient left: in bed;with call bell/phone within reach;with family/visitor present  OT Visit Diagnosis: Unsteadiness on feet (R26.81);Other abnormalities of gait and mobility (R26.89);Muscle weakness (generalized) (M62.81);Ataxia, unspecified (R27.0);Other symptoms and signs involving the nervous system (J47.829)                Time: 5621-3086 OT Time Calculation (min): 18 min Charges:  OT General Charges $OT Visit: 1 Visit OT Evaluation $OT Eval Moderate Complexity: 1 Mod  Neil [name] OT/L Acute Rehabilitation Department  [phone]  08/30/2023, 10:41 AM

## 2023-08-30 NOTE — Progress Notes (Addendum)
 PROGRESS NOTE  Neil [name]  DOB: 02-Oct-1929  PCP: Rae Bugler, MD NGE:[id number]  DOA: 08/29/2023  LOS: 1 day  Hospital Day: 2  Brief narrative: Neil [name] is a 89 y.o. male with PMH significant for HTN, paroxysmal A-fib on Eliquis , h/o psoriasis on Enbrel, hypothyroidism, glaucoma Patient lives at [facility] independent living facility.  At baseline, is able to walk without an assistive device.  Exercises 3 days a week. Patient was brought to the ED today with concern of hallucinations, visual loss.  Symptoms started about 3 days ago when he reported feeling strange shapes, animals around the house.  In the interval he also had difficulty reading from a piece of paper. Patient also reported urinary frequency for the past several days.  In the ED, patient was afebrile, heart rate in 60s, blood pressure in 160s Labs with CBC unremarkable, BMP with potassium of 3.3 Urinalysis unremarkable MRI brain showed 1. Large acute to early subacute right PCA infarct. 2. Small subacute infarct in the splenium of the corpus callosum on the left. 3. Moderate to severe chronic small vessel ischemic disease. 4. Chronic left temporal infarct.  Neurology Dr. Cleone Dad was called from the ED. Recommended inpatient stroke workup Admitted to [facility]  CT angio head and neck showed 1. Acute/subacute non-hemorrhagic infarct in the posterior inferior right temporal lobe and inferior right occipital lobe. 2. Moderate stenoses in the P2 segments of the posterior cerebral arteries bilaterally, left greater than right. 3. Atherosclerotic changes at the carotid bifurcations bilaterally without significant stenosis. 4. Tortuosity of the distal cervical internal carotid arteries bilaterally without focal stenosis. This is nonspecific, but suggests chronic hypertension. 5. Atherosclerotic calcifications at the dural margin of the right vertebral artery with 50% stenosis.  Subjective: Patient was seen and  examined this morning. Remains in [place] ED. Pending bed availability at stroke unit at [facility] No new symptoms overnight.  Family not at bedside  Assessment/Plan: Acute stroke - multifocal infarct Presented with 3 days of visual deficits, hallucinations while on Eliquis  MRI showed large subacute to early subacute PCA infarct.  Also showed small subacute infarct in the corpus callosum of the left, chronic left temporal infarct. He seems to be having all forms of stroke - acute, subacute and chronic infarct.  Probably hypertension related. Reports compliance to Eliquis  twice daily. Stroke workup started.  Neurology to formally see once patient is at [facility] As recommended by neurology yesterday Eliquis  is on hold and he has been started on aspirin 325 mg daily. HDL 47, LDL 111, TSH normal at 2.7 Had echocardiogram  Paroxysmal A-fib Eliquis  on hold as above Not on any AV nodal blocking agent  Hypertension PTA meds- Norvasc  5 mg daily Currently on hold to allow permissive hypertension Blood pressure in 140s-150s this morning  Hypokalemia Potassium running low at 3.1.  Replacement ordered. Recent Labs  Lab 08/29/23 1240 08/30/23 0500  K 3.3* 3.1*   h/o psoriasis  on Enbrel  Hypothyroidism Synthroid   Glaucoma Eyedrops  Mobility: PT eval.  Family is hoping an upgrade from ILF  Goals of care:   Code Status: Full Code.  Discussed with patient and daughter at bedside   DVT prophylaxis:  SCDs Start: 08/29/23 1612   Antimicrobials: None Fluid: None Consultants: Neurology Family Communication: Daughter not at bedside today.  Status: Inpatient Level of care:  Telemetry Medical   Patient is from: Independent living facility Anticipated d/c to: ILF versus ALF  Diet:  Diet Order  Diet Heart Room service appropriate? Yes; Fluid consistency: Thin  Diet effective now                     Scheduled Meds:   stroke: early stages of recovery  book   Does not apply Once   aspirin  325 mg Oral Daily   brimonidine -timolol   1 drop Right Eye Q12H   levothyroxine   75 mcg Oral QAC breakfast   pantoprazole   40 mg Oral Daily   potassium chloride   40 mEq Oral Once    PRN meds: acetaminophen  **OR** acetaminophen , albuterol, bisacodyl, polyethylene glycol   Infusions:    Antimicrobials: Anti-infectives (From admission, onward)    None       Objective: Vitals:   08/30/23 0600 08/30/23 1020  BP: (!) 140/72 (!) 152/70  Pulse: 60 67  Resp: 16 18  Temp:  97.7 F (36.5 C)  SpO2: 98% 100%   No intake or output data in the 24 hours ending 08/30/23 1047 Filed Weights   08/29/23 1232  Weight: 75 kg   Weight change:  Body mass index is 21.81 kg/m.   Physical Exam: General exam: Pleasant, elderly Caucasian male.  Not in distress Skin: No rashes, lesions or ulcers. HEENT: Atraumatic, normocephalic, no obvious bleeding Lungs: Clear to auscultation bilaterally,  CVS: S1, S2, no murmur,   GI/Abd: Soft, nontender, nondistended, bowel sound present,   CNS: Alert, awake, hard of hearing but oriented x 3.  Reports visual deficits on left eye since stroke.  No other focal deficit Psychiatry: Mood appropriate,  Extremities: Trace left pedal edema, no calf tenderness,   Data Review: I have personally reviewed the laboratory data and studies available.  F/u labs ordered Unresulted Labs (From admission, onward)     Start     Ordered   08/31/23 0500  Basic metabolic panel with GFR  Tomorrow morning,   R        08/30/23 1041           Signed, Hoyt Macleod, MD [facility] 08/30/2023

## 2023-08-30 NOTE — ED Notes (Signed)
 This nurse witnessed pt exit his room with blood stained clothes and left had covered in bright red blood. This nursed offered pt to take a seat. Pt was escorted back to his bed and stated "I was trying to go to the bathroom". Pt had accidentally removed left peripheral IV leaving large hematoma on left had. Pt was cleaned up and hand was wrapped up with guaze and coban. Pt escorted to the bathroom with walker and nurse.

## 2023-08-30 NOTE — ED Notes (Signed)
Tried to call report.

## 2023-08-30 NOTE — ED Notes (Signed)
Shift reports received, assumed care of patient at this time.

## 2023-08-30 NOTE — ED Notes (Signed)
 [facility] called.

## 2023-08-30 NOTE — Consult Note (Signed)
 NEUROLOGY CONSULT NOTE   Date of service: Aug 30, 2023 Patient Name: Neil [name] MRN:  [id number] DOB:  06/15/1929 Chief Complaint: "Visual hallucinations" Requesting Provider: Hoyt Macleod, MD  History of Present Illness  Neil [name] is a 89 y.o. male with hx of afib on eliquis , glaucoma, hypertension, and psoriasis.  He presents from friend's home with episodes of hallucinations and vision loss.  He is on Eliquis  for his atrial fibrillation.  He reports that up couple of days ago he began having visual hallucinations and he additionally mentions he was having trouble understanding his girlfriend and he thought she was going crazy.  Additionally he reported seeing "mostly animals but other strange things like shapes" around the house, the daytime and at night. He denies any auditory hallucinations. He denies any visual loss himself but his daughter reports that she called him 2 days ago and tried to have him read off a piece of paper but he was not able to do so. Patient denies any falls. Denies headaches. Denies prior history of stroke.   He does endorse potentially missing some doses of his medications at times -- however he notes he has been taking it the past few days  LKW: 2-3 days ago Modified rankin score: 0-Completely asymptomatic and back to baseline post- stroke IV Thrombolysis: No, outside of window EVT: No, outside of window  NIHSS components Score: Comment  1a Level of Conscious 0[x]  1[]  2[]  3[]      1b LOC Questions 0[x]  1[]  2[]       1c LOC Commands 0[x]  1[]  2[]       2 Best Gaze 0[x]  1[]  2[]       3 Visual 0[]  1[]  2[x]  3[]    Left hemianopia, affecting upper quadrant more than lower but both quadrants affected  4 Facial Palsy 0[x]  1[]  2[]  3[]      5a Motor Arm - left 0[x]  1[]  2[]  3[]  4[]  UN[]    5b Motor Arm - Right 0[x]  1[]  2[]  3[]  4[]  UN[]    6a Motor Leg - Left 0[x]  1[]  2[]  3[]  4[]  UN[]    6b Motor Leg - Right 0[x]  1[]  2[]  3[]  4[]  UN[]    7 Limb Ataxia 0[x]  1[]  2[]  UN[]     Slight action tremor noted but no ataxia   8 Sensory 0[x]  1[]  2[]  UN[]      9 Best Language 0[x]  1[]  2[]  3[]      10 Dysarthria 0[x]  1[]  2[]  UN[]      11 Extinct. and Inattention 0[x]  1[]  2[]       TOTAL:0       ROS  Comprehensive ROS performed and pertinent positives documented in HPI   Past History   Past Medical History:  Diagnosis Date   Glaucoma    Hypertension    Psoriasis     Past Surgical History:  Procedure Laterality Date   GLAUCOMA SURGERY Left    HERNIA REPAIR     HYDROCELE EXCISION / REPAIR      Family History: Family History  Problem Relation Age of Onset   High blood pressure Mother    Stroke Mother    High blood pressure Sister     Social History  reports that he has quit smoking. His smoking use included pipe. He has never been exposed to tobacco smoke. He has never used smokeless tobacco. He reports current alcohol use of about 7.0 standard drinks of alcohol per week. He reports that he does not use drugs.  No Known Allergies  Medications   Current Facility-Administered  Medications:    acetaminophen  (TYLENOL ) tablet 650 mg, 650 mg, Oral, Q6H PRN **OR** acetaminophen  (TYLENOL ) suppository 650 mg, 650 mg, Rectal, Q6H PRN, Dahal, Binaya, MD   albuterol (PROVENTIL) (2.5 MG/3ML) 0.083% nebulizer solution 2.5 mg, 2.5 mg, Nebulization, Q6H PRN, Dahal, Binaya, MD   aspirin tablet 325 mg, 325 mg, Oral, Daily, Dahal, Binaya, MD, 325 mg at 08/30/23 1102   bisacodyl (DULCOLAX) EC tablet 5 mg, 5 mg, Oral, Daily PRN, Dahal, Binaya, MD   brimonidine  (ALPHAGAN ) 0.2 % ophthalmic solution 1 drop, 1 drop, Right Eye, BID **AND** timolol  (TIMOPTIC ) 0.5 % ophthalmic solution 1 drop, 1 drop, Right Eye, BID, Dahal, Binaya, MD   levothyroxine  (SYNTHROID ) tablet 75 mcg, 75 mcg, Oral, QAC breakfast, Dahal, Binaya, MD, 75 mcg at 08/30/23 0756   pantoprazole  (PROTONIX ) EC tablet 40 mg, 40 mg, Oral, Daily, Dahal, Binaya, MD, 40 mg at 08/30/23 1103   polyethylene glycol (MIRALAX /  GLYCOLAX) packet 17 g, 17 g, Oral, Daily PRN, Dahal, Binaya, MD  Current Outpatient Medications:    amLODipine  (NORVASC ) 5 MG tablet, Take 5 mg by mouth daily., Disp: , Rfl:    apixaban  (ELIQUIS ) 5 MG TABS tablet, Take 1 tablet (5 mg total) by mouth 2 (two) times daily. NEEDS LABS FOR ELIQUIS  REFILLS, COME TO OFFICE.  THANK YOU (Patient taking differently: Take 5 mg by mouth 2 (two) times daily.), Disp: 60 tablet, Rfl: 0   bimatoprost (LUMIGAN) 0.01 % SOLN, Place 1 drop into the right eye at bedtime., Disp: , Rfl:    brimonidine -timolol  (COMBIGAN ) 0.2-0.5 % ophthalmic solution, Place 1 drop into both eyes in the morning and at bedtime., Disp: , Rfl:    dorzolamide (TRUSOPT) 2 % ophthalmic solution, Place 1 drop into both eyes in the morning and at bedtime., Disp: , Rfl:    levothyroxine  (SYNTHROID ) 75 MCG tablet, Take 75 mcg by mouth See admin instructions. Take 75 mcg by mouth five hours after taking Omeprazole, Disp: , Rfl:    Multiple Vitamin (MULTIVITAMIN ADULT PO), Take 1 tablet by mouth daily with breakfast., Disp: , Rfl:    omeprazole (PRILOSEC) 40 MG capsule, Take 40 mg by mouth daily before breakfast., Disp: , Rfl:    Psyllium (METAMUCIL 4 IN 1 FIBER) 55.6 % POWD, Take 3.4 g by mouth See admin instructions. Mix 3.4 grams into a glass of water and drink by mouth at bedtime, Disp: , Rfl:    vitamin B-12 (CYANOCOBALAMIN ) 1000 MCG tablet, Take 1,000 mcg by mouth daily., Disp: , Rfl:    amLODipine  (NORVASC ) 10 MG tablet, Take 1 tablet (10 mg total) by mouth daily. (Patient not taking: Reported on 08/29/2023), Disp: 90 tablet, Rfl: 2  Vitals   Vitals:   08/30/23 1020 08/30/23 1430 08/30/23 1446 08/30/23 1454  BP: (!) 152/70 136/71    Pulse: 67 (!) 55  60  Resp: 18 16    Temp: 97.7 F (36.5 C)  98.1 F (36.7 C)   TempSrc: Oral  Oral   SpO2: 100% 97%  98%  Weight:      Height:        Body mass index is 21.81 kg/m.  Physical Exam   Constitutional: Appears well-developed and  well-nourished.  Psych: Affect appropriate to situation.  Eyes: No scleral injection.  HENT: No OP obstruction.  Head: Normocephalic.  Cardiovascular: Normal rate and regular rhythm.  Respiratory: Effort normal, non-labored breathing.  GI: Soft.  No distension. There is no tenderness.  Skin: WDI.   Neurologic Examination  Neuro: Mental Status: Patient is awake, alert, oriented to person, place, month, initially states it is 1965 however corrects himself. Patient is able to give a history regarding events leading up to hospitalization.  He does not seem to have any aphasia or neglect, however he does seem to have some difficulty in processing what he is reading and seeing. Cranial Nerves: II: Left surgical pupil, right pupil is reactive 3 mm.  Exam slightly inconsistent when doing visual field testing for NP but on MD evaluation he does have left hemianopia, worse in the upper quadrant than lower quadrant III,IV, VI: EOMI without ptosis or diploplia.  V: Facial sensation is symmetric to temperature VII: Facial movement is symmetric resting and smiling VIII: Hard of hearing  X: Palate elevates symmetrically XI: Shoulder shrug is symmetric. XII: Tongue protrudes midline without atrophy or fasciculations.  Motor: Tone is normal. Bulk is normal. 5/5 strength was present in all four extremities.  Sensory: Sensation is symmetric to light touch and temperature in the arms and legs. No extinction to DSS present.  Cerebellar: FNF and HKS are intact bilaterally Slight tremor noted in bilateral upper extremities with movement Gait: Observed to ambulate overall steadily with a walker with PT with intermittent mild loss of balance at times  Labs/Imaging/Neurodiagnostic studies   CBC:  Recent Labs  Lab 09/20/23 1240 08/30/23 0500  WBC 7.3 7.4  HGB 13.5 13.4  HCT 40.6 40.0  MCV 95.5 93.7  PLT 180 180   Basic Metabolic Panel:  Lab Results  Component Value Date   NA 133 (L)  08/30/2023   K 3.1 (L) 08/30/2023   CO2 25 08/30/2023   GLUCOSE 95 08/30/2023   BUN 16 08/30/2023   CREATININE 0.85 08/30/2023   CALCIUM  8.6 (L) 08/30/2023   GFRNONAA >60 08/30/2023   GFRAA 59 (L) 02/12/2019   Lipid Panel:  Lab Results  Component Value Date   LDLCALC 111 (H) 08/30/2023   HgbA1c:  Lab Results  Component Value Date   HGBA1C 5.0 September 20, 2023    INR  Lab Results  Component Value Date   INR 1.1 10/09/2018   APTT No results found for: "APTT" AED levels: No results found for: "PHENYTOIN", "ZONISAMIDE", "LAMOTRIGINE", "LEVETIRACETA"  CT angio Head and Neck with contrast(Personally reviewed): 1. Acute/subacute non-hemorrhagic infarct in the posterior inferior right temporal lobe and inferior right occipital lobe. 2. Moderate stenoses in the P2 segments of the posterior cerebral arteries bilaterally, left greater than right. 3. Atherosclerotic changes at the carotid bifurcations bilaterally without significant stenosis. 4. Tortuosity of the distal cervical internal carotid arteries bilaterally without focal stenosis. This is nonspecific, but suggests chronic hypertension. 5. Atherosclerotic calcifications at the dural margin of the right vertebral artery with 50% stenosis.  MRI Brain(Personally reviewed): 1. Large acute to early subacute right PCA infarct. 2. Small subacute infarct in the splenium of the corpus callosum on the left. 3. Moderate to severe chronic small vessel ischemic disease. 4. Chronic left temporal infarct.  Echocardiogram:  1. Left ventricular ejection fraction, by estimation, is 55 to 60%. The  left ventricle has normal function. The left ventricle has no regional  wall motion abnormalities. Left ventricular diastolic function could not  be evaluated.   2. Right ventricular systolic function is normal. The right ventricular  size is normal.   3. The mitral valve is normal in structure. No evidence of mitral valve  regurgitation. No evidence  of mitral stenosis.   4. The aortic valve is normal in structure. Aortic valve  regurgitation is  not visualized. No aortic stenosis is present.   5. Aortic dilatation noted. There is borderline dilatation of the  ascending aorta, measuring 39 mm.   6. The inferior vena cava is normal in size with greater than 50%  respiratory variability, suggesting right atrial pressure of 3 mmHg.   ASSESSMENT   Neil [name] is a 89 y.o. male who is on Eliquis  for atrial fibrillation presenting with visual abnormalities and visual hallucinations.  He was found to have a large early to subacute right PCA infarct and a small infarct in the splenium of the corpus callosum on the left, with exam consistent with the right PCA infarct with left upper quadrant greater than left lower quadrant vision loss.    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 likely in the setting of intermittent missed doses of Eliquis  - Start statin therapy as LDL is above goal; atorvastatin 40 mg nightly - Frequent neuro checks - Echocardiogram completed on neurorecommendations, reassuring as above - Given it has been several days since symptom onset and he was tolerating Eliquis  at home, okay to resume Eliquis  starting tomorrow morning and discontinue aspirin at that time (ordered) - Risk factor modification, extensively counseled patient and daughter at bedside on strategies to improve medication adherence - Telemetry monitoring - No driving due to visual field deficit - PT consult, OT consult, Speech consult for cognitive evaluation  - Inpatient neurology will sign off at this time, please reach out to stroke team if additional questions or concerns arise ______________________________________________________________________    Signed, Imogene Mana, NP [facility]  Attending Neurologist's note:  I personally saw this patient, gathering history, performing a full neurologic examination,  reviewing relevant labs, personally reviewing relevant imaging including CTA head and neck, MRI brain, and formulated the assessment and plan, adding the note above for completeness and clarity to accurately reflect my thoughts  Baldwin Levee MD-PhD [facility] [phone]

## 2023-08-31 DIAGNOSIS — I639 Cerebral infarction, unspecified: Secondary | ICD-10-CM | POA: Diagnosis not present

## 2023-08-31 LAB — BASIC METABOLIC PANEL WITH GFR
Anion gap: 8 (ref 5–15)
BUN: 18 mg/dL (ref 8–23)
CO2: 26 mmol/L (ref 22–32)
Calcium: 8.9 mg/dL (ref 8.9–10.3)
Chloride: 105 mmol/L (ref 98–111)
Creatinine, Ser: 1.04 mg/dL (ref 0.61–1.24)
GFR, Estimated: >60 mL/min (ref >60)
Glucose, Bld: 95 mg/dL (ref 70–99)
Potassium: 3.4 mmol/L — ABNORMAL LOW (ref 3.5–5.1)
Sodium: 139 mmol/L (ref 135–145)

## 2023-08-31 MED ORDER — DORZOLAMIDE HCL 2 % OP SOLN
1.0000 [drp] | Freq: Two times a day (BID) | OPHTHALMIC | Status: DC
  Administered 2023-08-31 – 2023-09-01 (×3): 1 [drp] via OPHTHALMIC
  Filled 2023-08-31: qty 10

## 2023-08-31 MED ORDER — DOCUSATE SODIUM 100 MG PO CAPS
100.0000 mg | ORAL_CAPSULE | Freq: Two times a day (BID) | ORAL | Status: DC
  Administered 2023-08-31 – 2023-09-01 (×3): 100 mg via ORAL
  Filled 2023-08-31 (×3): qty 1

## 2023-08-31 MED ORDER — VITAMIN B-12 1000 MCG PO TABS
1000.0000 ug | ORAL_TABLET | Freq: Every day | ORAL | Status: DC
  Administered 2023-08-31 – 2023-09-01 (×2): 1000 ug via ORAL
  Filled 2023-08-31 (×2): qty 1

## 2023-08-31 MED ORDER — LATANOPROST 0.005 % OP SOLN
1.0000 [drp] | Freq: Every day | OPHTHALMIC | Status: DC
  Administered 2023-08-31: 1 [drp] via OPHTHALMIC
  Filled 2023-08-31: qty 2.5

## 2023-08-31 MED ORDER — POTASSIUM CHLORIDE CRYS ER 20 MEQ PO TBCR
40.0000 meq | EXTENDED_RELEASE_TABLET | Freq: Once | ORAL | Status: AC
  Administered 2023-08-31: 40 meq via ORAL
  Filled 2023-08-31: qty 2

## 2023-08-31 MED ORDER — ADULT MULTIVITAMIN W/MINERALS CH
1.0000 | ORAL_TABLET | Freq: Every day | ORAL | Status: DC
  Administered 2023-09-01: 1 via ORAL
  Filled 2023-08-31: qty 1

## 2023-08-31 MED ORDER — PSYLLIUM 95 % PO PACK
1.0000 | PACK | Freq: Every day | ORAL | Status: DC
  Administered 2023-09-01: 1 via ORAL
  Filled 2023-08-31 (×2): qty 1

## 2023-08-31 NOTE — Progress Notes (Signed)
 OT Cancellation Note  Patient Details Name: TOVIA KISNER MRN: [id number] DOB: February 27, 1930   Cancelled Treatment:    Reason Eval/Treat Not Completed: Other (comment) (Plan is to DC to SNF tomorrow at [facility].) Will defer OT  to SNF If OT eval needed, please call [phone] or message via [facility] OT. Thank you,   Cassell Voorhies,HILLARY 08/31/2023, 1:18 PM Milburn Aliment, OT/L   Acute OT Clinical Specialist Acute Rehabilitation Services Pager [phone] Office [phone]

## 2023-08-31 NOTE — Hospital Course (Signed)
 Neil [name] is a 89 y.o. male with past medical history ignificant for HTN, paroxysmal A-fib on Eliquis , h/o psoriasis on Enbrel, hypothyroidism, glaucoma currently at independent living facility who is able to walk without assistive device presented to hospital with hallucinations and visual loss.  He noticed a strange shapes and animals.  Reported urinary frequency.  In the ED patient had stable vitals.  Labs were unremarkable except for potassium low at 3.3.  Urinalysis negative for infection.  MRI of the brain showed. Large acute to early subacute right PCA infarct, Small subacute infarct in the splenium of the corpus callosum on the left.  Neil [name] her head and neck with moderate stenosis in the P2 segment bilaterally.  Neurology was consulted and patient was admitted hospital for further evaluation and treatment.   Assessment/Plan:  Right PCA and left corpus callosum infarct Likely secondary to missed doses of Eliquis .  Neurology has seen the patient.  Continue statins.  2D echocardiogram with LV ejection fraction of 55 to 60%..  Neurology okay about starting Eliquis  today and discontinuation of aspirin.  No driving due to visual field deficit.  Will get PT OT speech therapy consultation.  Neurology has signed off at this time. HDL 47, LDL 111, TSH normal at 2.7  Paroxysmal A-fib Has been started on Eliquis .  Hypertension On permissive hypertension at this time.  On Norvasc  at home.  Hypokalemia Mild.  Will continue to replenish today.  History of psoriasis  on Enbrel  Hypothyroidism Continue Synthroid   Glaucoma Continue eyedrops

## 2023-08-31 NOTE — Plan of Care (Signed)

## 2023-08-31 NOTE — NC FL2 (Signed)
 [place]  MEDICAID FL2 LEVEL OF CARE FORM     IDENTIFICATION  Patient Name: Neil [name] Birthdate: 1930-01-06 Sex: male Admission Date (Current Location): 08/29/2023  [facility] and [place] Number:  Producer, television/film/video and Address:  [facility] [place], [address] [place], [place] [address]      Provider Number: [id number]  Attending Physician Name and Address:  Rosena Conradi, MD  Relative Name and Phone Number:       Current Level of Care: Hospital Recommended Level of Care: Skilled Nursing Facility Prior Approval Number:    Date Approved/Denied:   PASRR Number: [id number]  Discharge Plan: SNF    Current Diagnoses: Patient Active Problem List   Diagnosis Date Noted   Acute stroke due to ischemia (HCC) 08/29/2023   Atrial flutter (HCC) 10/10/2018    Orientation RESPIRATION BLADDER Height & Weight     Self, Time, Place  Normal Continent Weight: 164 lb 7.4 oz (74.6 kg) Height:  6\' 1"  (185.4 cm)  BEHAVIORAL SYMPTOMS/MOOD NEUROLOGICAL BOWEL NUTRITION STATUS      Continent Diet (heart healthy)  AMBULATORY STATUS COMMUNICATION OF NEEDS Skin   Limited Assist Verbally Normal                       Personal Care Assistance Level of Assistance  Bathing, Feeding, Dressing Bathing Assistance: Limited assistance Feeding assistance: Independent Dressing Assistance: Limited assistance     Functional Limitations Info  Sight Sight Info: Impaired (glasses)        SPECIAL CARE FACTORS FREQUENCY  PT (By licensed PT), OT (By licensed OT)     PT Frequency: 5x/wk OT Frequency: 5x/wk            Contractures Contractures Info: Not present    Additional Factors Info  Code Status, Allergies, Psychotropic Code Status Info: Full Allergies Info: NKA Psychotropic Info: Zyprexa 5mg  daily at bed         Current Medications (08/31/2023):  This is the current hospital active medication list Current Facility-Administered Medications   Medication Dose Route Frequency Provider Last Rate Last Admin   acetaminophen  (TYLENOL ) tablet 650 mg  650 mg Oral Q6H PRN Dahal, Aminta Baldy, MD       Or   acetaminophen  (TYLENOL ) suppository 650 mg  650 mg Rectal Q6H PRN Dahal, Binaya, MD       albuterol (PROVENTIL) (2.5 MG/3ML) 0.083% nebulizer solution 2.5 mg  2.5 mg Nebulization Q6H PRN Dahal, Binaya, MD       apixaban  (ELIQUIS ) tablet 5 mg  5 mg Oral BID Bhagat, Srishti L, MD   5 mg at 08/31/23 1105   atorvastatin (LIPITOR) tablet 40 mg  40 mg Oral QHS Bhagat, Srishti L, MD   40 mg at 08/30/23 2117   bisacodyl (DULCOLAX) EC tablet 5 mg  5 mg Oral Daily PRN Dahal, Aminta Baldy, MD       brimonidine  (ALPHAGAN ) 0.2 % ophthalmic solution 1 drop  1 drop Right Eye BID Dahal, Aminta Baldy, MD   1 drop at 08/31/23 1105   And   timolol  (TIMOPTIC ) 0.5 % ophthalmic solution 1 drop  1 drop Right Eye BID Dahal, Aminta Baldy, MD   1 drop at 08/31/23 1106   docusate sodium (COLACE) capsule 100 mg  100 mg Oral BID Pokhrel, Laxman, MD       dorzolamide (TRUSOPT) 2 % ophthalmic solution 1 drop  1 drop Both Eyes BID Pokhrel, Laxman, MD       haloperidol lactate (  HALDOL) injection 1 mg  1 mg Intravenous Q6H PRN Sundil, Subrina, MD       levothyroxine  (SYNTHROID ) tablet 75 mcg  75 mcg Oral QAC breakfast Dahal, Aminta Baldy, MD   75 mcg at 08/31/23 0526   OLANZapine (ZYPREXA) tablet 5 mg  5 mg Oral QHS Sundil, Subrina, MD   5 mg at 08/30/23 2345   pantoprazole  (PROTONIX ) EC tablet 40 mg  40 mg Oral Daily Dahal, Binaya, MD   40 mg at 08/31/23 1105   polyethylene glycol (MIRALAX / GLYCOLAX) packet 17 g  17 g Oral Daily PRN Hoyt Macleod, MD         Discharge Medications: Please see discharge summary for a list of discharge medications.  Relevant Imaging Results:  Relevant Lab Results:   Additional Information SS#: [id number]  Tandy Fam, LCSW

## 2023-08-31 NOTE — TOC Initial Note (Signed)
 Transition of Care [facility]) - Initial/Assessment Note    Patient Details  Name: Neil [name] MRN: [id number] Date of Birth: 26-Feb-1930  [facility] CM/SW Contact:    Tandy Fam, LCSW Phone Number: 08/31/2023, 12:31 PM  Clinical Narrative:    Patient from [facility], recommendation for SNF. CSW spoke with Hospital doctor at [facility], they will have a SNF bed for patient at discharge, potentially tomorrow if medically stable. Patient will go to [facility]. Nurse on call tomorrow is Voncille Gu[place]ara [phone]. CSW met with patient's daughter, Edwina Gram, at bedside to provide update and she is in agreement. Family plans to provide transportation. CSW to follow.              Expected Discharge Plan: Skilled Nursing Facility Barriers to Discharge: Continued Medical Work up, English as a second language teacher   Patient Goals and CMS Choice Patient states their goals for this hospitalization and ongoing recovery are:: patient unable to participate in goal setting, not fully oriented CMS Medicare.gov Compare Post Acute Care list provided to:: Patient Represenative (must comment) Choice offered to / list presented to : Adult Children [place] ownership interest in [facility].provided to:: Adult Children    Expected Discharge Plan and Services     Post Acute Care Choice: Skilled Nursing Facility Living arrangements for the past 2 months: Independent Living Facility                                      Prior Living Arrangements/Services Living arrangements for the past 2 months: Independent Living Facility Lives with:: Facility Resident Patient language and need for interpreter reviewed:: No Do you feel safe going back to the place where you live?: Yes      Need for Family Participation in Patient Care: Yes (Comment) Care giver support system in place?: Yes (comment)   Criminal Activity/Legal Involvement Pertinent to Current Situation/Hospitalization: No -  Comment as needed  Activities of Daily Living   ADL Screening (condition at time of admission) Independently performs ADLs?: Yes (appropriate for developmental age) Is the patient deaf or have difficulty hearing?: Yes Does the patient have difficulty seeing, even when wearing glasses/contacts?: Yes Does the patient have difficulty concentrating, remembering, or making decisions?: No  Permission Sought/Granted Permission sought to share information with : Facility Medical sales representative, Family Supports Permission granted to share information with : Yes, Verbal Permission Granted  Share Information with NAME: Edwina Gram  Permission granted to share info w AGENCY: [facility]  Permission granted to share info w Relationship: Daughter     Emotional Assessment Appearance:: Appears stated age Attitude/Demeanor/Rapport: Unable to Assess Affect (typically observed): Unable to Assess Orientation: : Oriented to Self, Oriented to Place, Oriented to  Time Alcohol / Substance Use: Not Applicable Psych Involvement: No (comment)  Admission diagnosis:  Cerebrovascular accident (CVA), unspecified mechanism (HCC) [I63.9] Acute stroke due to ischemia [facility]) [I63.9] Patient Active Problem List   Diagnosis Date Noted   Acute stroke due to ischemia (HCC) 08/29/2023   Atrial flutter (HCC) 10/10/2018   PCP:  Rae Bugler, MD Pharmacy:   [id number] [place], [place] - [address] [place] [address] Phone: [phone] Fax: [phone]     Social Drivers of Health (SDOH) Social History: SDOH Screenings   Food Insecurity: No Food Insecurity (08/30/2023)  Housing: Low Risk  (08/30/2023)  Transportation Needs: No Transportation Needs (08/30/2023)  Utilities: Not At Risk (08/30/2023)  Social Connections: Moderately Integrated (08/30/2023)  Tobacco Use: Medium Risk (08/30/2023)   SDOH Interventions:     Readmission Risk Interventions     No data to display

## 2023-08-31 NOTE — Care Management Important Message (Signed)
 Important Message  Patient Details  Name: Neil [name] MRN: [id number] Date of Birth: [birth year]   Important Message Given:  Yes - Medicare IM     Wynonia Hedges 08/31/2023, 3:56 PM

## 2023-08-31 NOTE — Progress Notes (Addendum)
 PROGRESS NOTE  Neil [name] WUJ:[id number] DOB: 08/14/1929 DOA: 08/29/2023 PCP: Rae Bugler, MD   LOS: 2 days   Brief narrative:  Neil [name] is a 89 y.o. male with past medical history ignificant for HTN, paroxysmal A-fib on Eliquis , h/o psoriasis on Enbrel, hypothyroidism, glaucoma currently at independent living facility who is able to walk without assistive device presented to hospital with hallucinations and visual loss.  He noticed a strange shapes and animals.  Reported urinary frequency.  In the ED patient had stable vitals.  Labs were unremarkable except for potassium low at 3.3.  Urinalysis negative for infection.  MRI of the brain showed. Large acute to early subacute right PCA infarct, Small subacute infarct in the splenium of the corpus callosum on the left.  Tyrone Gallop her head and neck with moderate stenosis in the P2 segment bilaterally.  Neurology was consulted and patient was admitted hospital for further evaluation and treatment.  Assessment/Plan: Principal Problem:   Acute stroke due to ischemia (HCC)  Right PCA and left corpus callosum infarct with cytotoxic edema but without mass effect.. Likely secondary to missed doses of Eliquis .  Neurology has seen the patient.  Continue statins.  2D echocardiogram with LV ejection fraction of 55 to 60%..  Neurology okay about starting Eliquis  today and discontinuation of aspirin.  No driving due to visual field deficit.  PT has seen the patient and recommended skilled nursing facility placement..  Neurology has signed off at this time. HDL 47, LDL 111, TSH normal at 2.7  Paroxysmal A-fib Has been started on Eliquis .  Hypertension On permissive hypertension at this time.  On Norvasc  at home.  Hypokalemia Mild.  Will continue to replenish today.  History of psoriasis  on Enbrel  Hypothyroidism Continue Synthroid   Glaucoma Continue eyedrops  Constipation.  Will add MiraLAX and Colace.  DVT prophylaxis: SCDs Start:  08/29/23 1612 apixaban  (ELIQUIS ) tablet 5 mg   Disposition: PT has recommended skilled nursing facility placement at this time.  Status is: Inpatient Remains inpatient appropriate because: Stroke, need for rehabilitation.    Code Status:     Code Status: Full Code  Family Communication: None at bedside  Consultants: Neurology  Procedures: None  Anti-infectives:  None  Anti-infectives (From admission, onward)    None        Subjective: Today, patient was seen and examined at bedside.  Patient denies any dizziness lightheadedness hallucinations today.  Has not had bowel movement.  Has some cough.  Objective: Vitals:   08/31/23 0354 08/31/23 0724  BP: (!) 171/67 139/75  Pulse: (!) 57 (!) 56  Resp: 19 18  Temp: 97.9 F (36.6 C) 97.9 F (36.6 C)  SpO2: 98% 98%   No intake or output data in the 24 hours ending 08/31/23 1128 Filed Weights   08/29/23 1232 08/30/23 1640  Weight: 75 kg 74.6 kg   Body mass index is 21.7 kg/m.   Physical Exam: GENERAL: Patient is alert awake and oriented. Not in obvious distress.  Elderly male, Communicative. HENT: No scleral pallor or icterus. Pupils equally reactive to light. Oral mucosa is moist NECK: is supple, no gross swelling noted. CHEST: Clear to auscultation..  Diminished breath sounds bilaterally. CVS: S1 and S2 heard, no murmur. Regular rate and rhythm.  ABDOMEN: Soft, non-tender, bowel sounds are present. EXTREMITIES: No edema. CNS: Left surgical pupil.  Moves all extremities. SKIN: warm and dry without rashes.  Data Review: I have personally reviewed the following laboratory data and studies,  CBC:  Recent Labs  Lab 08/29/23 1240 08/30/23 0500  WBC 7.3 7.4  HGB 13.5 13.4  HCT 40.6 40.0  MCV 95.5 93.7  PLT 180 180   Basic Metabolic Panel: Recent Labs  Lab 08/29/23 1240 08/30/23 0500 08/31/23 0633  NA 138 133* 139  K 3.3* 3.1* 3.4*  CL 110 101 105  CO2 22 25 26   GLUCOSE 91 95 95  BUN 17 16 18    CREATININE 0.71 0.85 1.04  CALCIUM 7.6* 8.6* 8.9   Liver Function Tests: Recent Labs  Lab 08/29/23 1240  AST 20  ALT 9  ALKPHOS 53  BILITOT 1.1  PROT 6.3*  ALBUMIN 3.3*   No results for input(s): "LIPASE", "AMYLASE" in the last 168 hours. No results for input(s): "AMMONIA" in the last 168 hours. Cardiac Enzymes: No results for input(s): "CKTOTAL", "CKMB", "CKMBINDEX", "TROPONINI" in the last 168 hours. BNP (last 3 results) No results for input(s): "BNP" in the last 8760 hours.  ProBNP (last 3 results) No results for input(s): "PROBNP" in the last 8760 hours.  CBG: Recent Labs  Lab 08/29/23 1233  GLUCAP 101*   No results found for this or any previous visit (from the past 240 hours).   Studies: ECHOCARDIOGRAM COMPLETE Result Date: 08/30/2023    ECHOCARDIOGRAM REPORT   Patient Name:   Neil [name] Date of Exam: 08/30/2023 Medical Rec #:  [id number]       Height:       73.0 in Accession #:    [id number]      Weight:       165.3 lb Date of Birth:  June 15, 1929       BSA:          1.984 m Patient Age:    89 years        BP:           140/72 mmHg Patient Gender: M               HR:           57 bpm. Exam Location:  Inpatient Procedure: 2D Echo, Cardiac Doppler and Color Doppler (Both Spectral and Color            Flow Doppler were utilized during procedure). Indications:    Stroke  History:        Patient has prior history of Echocardiogram examinations.                 Arrythmias:Atrial Flutter; Risk Factors:Hypertension and Former                 Smoker.  Sonographer:    Willey Harrier Referring Phys: [id number] BINAYA DAHAL IMPRESSIONS  1. Left ventricular ejection fraction, by estimation, is 55 to 60%. The left ventricle has normal function. The left ventricle has no regional wall motion abnormalities. Left ventricular diastolic function could not be evaluated.  2. Right ventricular systolic function is normal. The right ventricular size is normal.  3. The mitral valve is normal in  structure. No evidence of mitral valve regurgitation. No evidence of mitral stenosis.  4. The aortic valve is normal in structure. Aortic valve regurgitation is not visualized. No aortic stenosis is present.  5. Aortic dilatation noted. There is borderline dilatation of the ascending aorta, measuring 39 mm.  6. The inferior vena cava is normal in size with greater than 50% respiratory variability, suggesting right atrial pressure of 3 mmHg. FINDINGS  Left Ventricle: Left ventricular ejection fraction, by estimation, is 55  to 60%. The left ventricle has normal function. The left ventricle has no regional wall motion abnormalities. The left ventricular internal cavity size was normal in size. There is  no left ventricular hypertrophy. Left ventricular diastolic function could not be evaluated due to atrial fibrillation. Left ventricular diastolic function could not be evaluated. Right Ventricle: The right ventricular size is normal. No increase in right ventricular wall thickness. Right ventricular systolic function is normal. Left Atrium: Left atrial size was normal in size. Right Atrium: Right atrial size was normal in size. Pericardium: There is no evidence of pericardial effusion. Mitral Valve: The mitral valve is normal in structure. No evidence of mitral valve regurgitation. No evidence of mitral valve stenosis. MV peak gradient, 3.9 mmHg. The mean mitral valve gradient is 1.0 mmHg. Tricuspid Valve: The tricuspid valve is normal in structure. Tricuspid valve regurgitation is not demonstrated. No evidence of tricuspid stenosis. Aortic Valve: The aortic valve is normal in structure. Aortic valve regurgitation is not visualized. No aortic stenosis is present. Aortic valve peak gradient measures 4.7 mmHg. Pulmonic Valve: The pulmonic valve was normal in structure. Pulmonic valve regurgitation is not visualized. No evidence of pulmonic stenosis. Aorta: Aortic dilatation noted. There is borderline dilatation of the  ascending aorta, measuring 39 mm. Venous: The inferior vena cava is normal in size with greater than 50% respiratory variability, suggesting right atrial pressure of 3 mmHg. IAS/Shunts: No atrial level shunt detected by color flow Doppler.  LEFT VENTRICLE PLAX 2D LVIDd:         4.90 cm   Diastology LVIDs:         3.30 cm   LV e' medial:    5.66 cm/s LV PW:         1.10 cm   LV E/e' medial:  11.1 LV IVS:        1.10 cm   LV e' lateral:   5.66 cm/s LVOT diam:     2.10 cm   LV E/e' lateral: 11.1 LV SV:         66 LV SV Index:   33 LVOT Area:     3.46 cm  RIGHT VENTRICLE          IVC RV Basal diam:  3.90 cm  IVC diam: 1.80 cm LEFT ATRIUM             Index        RIGHT ATRIUM           Index LA Vol (A2C):   60.0 ml 30.23 ml/m  RA Area:     19.20 cm LA Vol (A4C):   45.1 ml 22.73 ml/m  RA Volume:   49.70 ml  25.04 ml/m LA Biplane Vol: 52.5 ml 26.46 ml/m  AORTIC VALVE AV Area (Vmax): 2.66 cm AV Vmax:        108.00 cm/s AV Peak Grad:   4.7 mmHg LVOT Vmax:      82.90 cm/s LVOT Vmean:     55.400 cm/s LVOT VTI:       0.191 m  AORTA Ao Root diam: 3.50 cm Ao Asc diam:  3.90 cm MITRAL VALVE               TRICUSPID VALVE MV Area (PHT): 2.38 cm    TR Peak grad:   29.2 mmHg MV Area VTI:   2.68 cm    TR Vmax:        270.00 cm/s MV Peak grad:  3.9 mmHg MV Mean grad:  1.0 mmHg    SHUNTS MV Vmax:       0.99 m/s    Systemic VTI:  0.19 m MV Vmean:      51.1 cm/s   Systemic Diam: 2.10 cm MV Decel Time: 319 msec MV E velocity: 62.60 cm/s MV A velocity: 81.40 cm/s MV E/A ratio:  0.77 Aditya Sabharwal Electronically signed by Alwin Baars Signature Date/Time: 08/30/2023/3:05:15 PM    Final    CT ANGIO HEAD NECK W WO CM Result Date: 08/29/2023 EXAM: CT HEAD WITHOUT CTA HEAD AND NECK WITH AND WITHOUT 08/29/2023 06:31:52 PM TECHNIQUE: CTA of the head and neck was performed with and without the administration of intravenous contrast. Noncontrast CT of the head with reconstructed 2-D images are also provided for review. Multiplanar  2D and/or 3D reformatted images are provided for review. Automated exposure control, iterative reconstruction, and/or weight based adjustment of the mA/kV was utilized to reduce the radiation dose to as low as reasonably achievable. COMPARISON: MRI of the head without contrast 08/29/2023 CLINICAL HISTORY: Neuro deficit, acute, stroke suspected; concern for right PCA infarct. Notes from triage: Patient to ED by EMS from [facility] for AMS. Per EMS he has been having frequent urination, confusion and hallucinations for a few days. FINDINGS: CT HEAD: BRAIN AND VENTRICLES: The non-contrast head CT demonstrates an acute/subacute non-hemorrhagic infarct in the posterior inferior right temporal lobe and inferior right occipital lobe. The area of acute infarction in the left splenium of the corpus callosum is not appreciated by non-contrast head CT. Moderate confluent periventricular white matter hypoattenuation is again noted bilaterally. ORBITS: The visualized portion of the orbits demonstrate no acute abnormality. SINUSES: The visualized paranasal sinuses and mastoid air cells demonstrate no acute abnormality. SOFT TISSUES AND SKULL: No acute abnormality of the visualized skull or soft tissues. CTA NECK: AORTIC ARCH AND ARCH VESSELS: The aortic arch is incompletely imaged. Atherosclerotic changes are present in the left subclavian artery without focal stenosis. CERVICAL CAROTID ARTERIES: Atherosclerotic changes are present at the bifurcation of proximal right ICA without significant stenosis. Mild tortuosity is present in the distal cervical right ICA without significant stenosis. Scattered faint calcifications are present in the distal left common carotid artery without significant stenosis. Atherosclerotic calcifications at the left carotid artery are more prominent than on the right. No significant stenosis is present relative to the more distal vessel. Mild tortuosity of the cervical left ICA is  present just below the bifurcation without significant stenosis. CERVICAL VERTEBRAL ARTERIES: The left vertebral artery is the dominant vessel. Atherosclerotic calcifications are present at the dural margin of the right vertebral artery. A 50% stenosis is present. SOFT TISSUES: The lung apices are clear. No cervical or superior mediastinal lymphadenopathy. The larynx and pharynx are unremarkable. No acute abnormality of the salivary and thyroid  glands. BONES: Multilevel degenerative changes are present in the cervical spine. Straightening of the normal cervical lordosis is present. Moderate-to-severe foraminal stenosis is present bilaterally at C4-5 and on the right at C3-4. No focal osseous lesions are present. CTA HEAD: ANTERIOR CIRCULATION: Atherosclerotic calcifications are present within the cavernous internal carotid arteries bilaterally without significant stenosis through the ICA termini. Mild Narrowing is present in the nondominant right A1 segment. Scattered distal segmental narrowing is present within the ACA and MCA branch vessels consistent with intracranial atherosclerotic disease. No significant proximal stenosis or occlusion is present in the ACA or MCA branch vessels. No aneurysm. POSTERIOR CIRCULATION: The posterior cerebral arteries originate from the basilar tip. Moderate stenoses  are present in the P2 segments bilaterally, left greater than right. No aneurysm. OTHER: No dural venous sinus thrombosis on this non-dedicated study. IMPRESSION: 1. Acute/subacute non-hemorrhagic infarct in the posterior inferior right temporal lobe and inferior right occipital lobe. 2. Moderate stenoses in the P2 segments of the posterior cerebral arteries bilaterally, left greater than right. 3. Atherosclerotic changes at the carotid bifurcations bilaterally without significant stenosis. 4. Tortuosity of the distal cervical internal carotid arteries bilaterally without focal stenosis. This is nonspecific, but  suggests chronic hypertension. 5. Atherosclerotic calcifications at the dural margin of the right vertebral artery with 50% stenosis. Electronically signed by: Audree Leas MD 08/29/2023 07:47 PM EDT RP Workstation: [id number]   MR BRAIN WO CONTRAST Result Date: 08/29/2023 CLINICAL DATA:  Neuro deficit, acute, stroke suspected. Left sided hemianopsia, visual hallucinations. EXAM: MRI HEAD WITHOUT CONTRAST TECHNIQUE: Multiplanar, multiecho pulse sequences of the brain and surrounding structures were obtained without intravenous contrast. COMPARISON:  None Available. FINDINGS: Brain: There is a large acute to early subacute right PCA infarct involving the inferior right occipital and posterior temporal lobes. There is also a 1 cm subacute infarct involving the left lateral aspect of the splenium of the corpus callosum. There is well-defined cytotoxic edema associated with the right PCA infarct without significant mass effect. No intracranial hemorrhage, midline shift, hydrocephalus, or extra-axial fluid collection is identified. A small chronic infarct is noted in the posterolateral left temporal lobe, and there is also a tiny chronic cortical infarct in the left precentral gyrus. Patchy to confluent T2 hyperintensities elsewhere in cerebral white matter bilaterally are nonspecific but compatible with moderate to severe chronic small vessel ischemic disease. There are small chronic bilateral cerebellar infarcts. There is moderate cerebral atrophy. Vascular: Major intracranial vascular flow voids are preserved. Skull and upper cervical spine: Unremarkable bone marrow signal. Sinuses/Orbits: Bilateral cataract extraction. Minimal mucosal thickening in the left maxillary sinus. Clear mastoid air cells. Other: None. These results were called by telephone at the time of interpretation on 08/29/2023 at 3:41 pm to Dr. Jerald Molly , who verbally acknowledged these results. IMPRESSION: 1. Large acute to early  subacute right PCA infarct. 2. Small subacute infarct in the splenium of the corpus callosum on the left. 3. Moderate to severe chronic small vessel ischemic disease. 4. Chronic left temporal infarct. Electronically Signed   By: Aundra Lee M.D.   On: 08/29/2023 15:45      Rosena Conradi, MD  [facility] 08/31/2023  If 7PM-7AM, please contact night-coverage

## 2023-09-01 DIAGNOSIS — I4892 Unspecified atrial flutter: Secondary | ICD-10-CM | POA: Diagnosis not present

## 2023-09-01 DIAGNOSIS — E782 Mixed hyperlipidemia: Secondary | ICD-10-CM | POA: Diagnosis not present

## 2023-09-01 DIAGNOSIS — K219 Gastro-esophageal reflux disease without esophagitis: Secondary | ICD-10-CM | POA: Diagnosis not present

## 2023-09-01 DIAGNOSIS — E039 Hypothyroidism, unspecified: Secondary | ICD-10-CM | POA: Diagnosis not present

## 2023-09-01 DIAGNOSIS — E785 Hyperlipidemia, unspecified: Secondary | ICD-10-CM | POA: Diagnosis not present

## 2023-09-01 DIAGNOSIS — K5900 Constipation, unspecified: Secondary | ICD-10-CM | POA: Diagnosis not present

## 2023-09-01 DIAGNOSIS — L409 Psoriasis, unspecified: Secondary | ICD-10-CM | POA: Diagnosis not present

## 2023-09-01 DIAGNOSIS — K5901 Slow transit constipation: Secondary | ICD-10-CM | POA: Diagnosis not present

## 2023-09-01 DIAGNOSIS — I4891 Unspecified atrial fibrillation: Secondary | ICD-10-CM | POA: Diagnosis not present

## 2023-09-01 DIAGNOSIS — I63531 Cerebral infarction due to unspecified occlusion or stenosis of right posterior cerebral artery: Secondary | ICD-10-CM | POA: Diagnosis not present

## 2023-09-01 DIAGNOSIS — R41841 Cognitive communication deficit: Secondary | ICD-10-CM | POA: Diagnosis not present

## 2023-09-01 DIAGNOSIS — I483 Typical atrial flutter: Secondary | ICD-10-CM | POA: Diagnosis not present

## 2023-09-01 DIAGNOSIS — R2681 Unsteadiness on feet: Secondary | ICD-10-CM | POA: Diagnosis not present

## 2023-09-01 DIAGNOSIS — H409 Unspecified glaucoma: Secondary | ICD-10-CM | POA: Diagnosis not present

## 2023-09-01 DIAGNOSIS — I639 Cerebral infarction, unspecified: Secondary | ICD-10-CM | POA: Diagnosis not present

## 2023-09-01 DIAGNOSIS — I10 Essential (primary) hypertension: Secondary | ICD-10-CM | POA: Diagnosis not present

## 2023-09-01 DIAGNOSIS — M6281 Muscle weakness (generalized): Secondary | ICD-10-CM | POA: Diagnosis not present

## 2023-09-01 LAB — BASIC METABOLIC PANEL WITH GFR
Anion gap: 9 (ref 5–15)
BUN: 18 mg/dL (ref 8–23)
CO2: 23 mmol/L (ref 22–32)
Calcium: 9.2 mg/dL (ref 8.9–10.3)
Chloride: 107 mmol/L (ref 98–111)
Creatinine, Ser: 1.12 mg/dL (ref 0.61–1.24)
GFR, Estimated: >60 mL/min (ref >60)
Glucose, Bld: 95 mg/dL (ref 70–99)
Potassium: 4 mmol/L (ref 3.5–5.1)
Sodium: 139 mmol/L (ref 135–145)

## 2023-09-01 LAB — CBC
HCT: 45.6 % (ref 39.0–52.0)
Hemoglobin: 14.5 g/dL (ref 13.0–17.0)
MCH: 31.4 pg (ref 26.0–34.0)
MCHC: 31.8 g/dL (ref 30.0–36.0)
MCV: 98.7 fL (ref 80.0–100.0)
Platelets: 175 10*3/uL (ref 150–400)
RBC: 4.62 MIL/uL (ref 4.22–5.81)
RDW: 13.2 % (ref 11.5–15.5)
WBC: 7.4 10*3/uL (ref 4.0–10.5)
nRBC: 0 % (ref 0.0–0.2)

## 2023-09-01 LAB — MAGNESIUM: Magnesium: 2.1 mg/dL (ref 1.7–2.4)

## 2023-09-01 MED ORDER — BISACODYL 10 MG RE SUPP
10.0000 mg | Freq: Every day | RECTAL | Status: DC | PRN
  Administered 2023-09-01: 10 mg via RECTAL
  Filled 2023-09-01: qty 1

## 2023-09-01 MED ORDER — FLEET ENEMA RE ENEM: 1.0000 | ENEMA | Freq: Every day | RECTAL | Status: DC | PRN

## 2023-09-01 MED ORDER — ATORVASTATIN CALCIUM 40 MG PO TABS: 40.0000 mg | ORAL_TABLET | Freq: Every day | ORAL | 0 refills | Status: DC

## 2023-09-01 MED ORDER — BISACODYL 10 MG RE SUPP: 10.0000 mg | Freq: Every day | RECTAL | 0 refills | Status: AC | PRN

## 2023-09-01 NOTE — TOC Transition Note (Signed)
 Transition of Care [facility]) - Discharge Note   Patient Details  Name: Neil [name] MRN: [id number] Date of Birth: 1929/05/25  [facility] CM/SW Contact:  Jeffory Mings, LCSW Phone Number: 09/01/2023, 5:00 PM   Clinical Narrative: Pt for dc to [facility] today. Spoke to Harper with [facility] who confirmed they are prepared to admit pt to room 23 in the [facility] unit. DC summary and FL2 faxed to [facility] [phone]. RN provided with number for report. Pt's dtr at bedside and plans to transport pt via private vehicle. SW signing off at dc.   Paullette Boston, MSW, LCSW [phone] (coverage)      Final next level of care: Skilled Nursing Facility Barriers to Discharge: Barriers Resolved   Patient Goals and CMS Choice Patient states their goals for this hospitalization and ongoing recovery are:: patient unable to participate in goal setting, not fully oriented CMS Medicare.gov Compare Post Acute Care list provided to:: Patient Represenative (must comment) Choice offered to / list presented to : Adult Children [place] ownership interest in [facility].provided to:: Adult Children    Discharge Placement              Patient chooses bed at: [facility] Patient to be transferred to facility by: Family Name of family member notified: Lisa/dtr Patient and family notified of of transfer: 09/01/23  Discharge Plan and Services Additional resources added to the After Visit Summary for       Post Acute Care Choice: Skilled Nursing Facility                               Social Drivers of Health (SDOH) Interventions SDOH Screenings   Food Insecurity: No Food Insecurity (08/30/2023)  Housing: Low Risk  (08/30/2023)  Transportation Needs: No Transportation Needs (08/30/2023)  Utilities: Not At Risk (08/30/2023)  Social Connections: Moderately Integrated (08/30/2023)  Tobacco Use: Medium Risk (08/30/2023)      Readmission Risk Interventions     No data to display

## 2023-09-01 NOTE — Discharge Summary (Addendum)
 Neil [name] ZOX:[id number] DOB: 08-22-1929 DOA: 08/29/2023  PCP: Rae Bugler, MD  Admit date: 08/29/2023  Discharge date: 09/01/2023   Recommendations for Outpatient Follow-up:   Follow up with PCP in 1-2 weeks   Home Health: [facility] Equipment/Devices: N/A, going to rehab Consultations: Stroke service/neurology Discharge Condition: Improved CODE STATUS: Full Diet Recommendation: Heart Healthy   Diet Order             Diet Heart Room service appropriate? Yes; Fluid consistency: Thin  Diet effective now                    Chief Complaint  Patient presents with   Altered Mental Status     Brief history of present illness from the day of admission and additional interim summary    Neil [name] is a 89 y.o. male with PMH significant for HTN, paroxysmal A-fib on Eliquis , h/o psoriasis on Enbrel, hypothyroidism, glaucoma Patient lives at [facility] independent living facility.  At baseline, is able to walk without an assistive device.  Exercises 3 days a week. Patient was brought to the ED today with concern of hallucinations, visual loss.   He reports that up couple of days ago he began having visual hallucinations and he additionally mentions he was having trouble understanding his girlfriend and he thought she was going crazy.  Additionally he reported seeing "mostly animals but other strange things like shapes" around the house, the daytime and at night. He denies any auditory hallucinations. He denies any visual loss himself but his daughter reports that she called him 2 days ago and tried to have him read off a piece of paper but he was not able to do so. Patient denies any falls.  He also does endorse potentially missing some doses of his medications at times -- however he  notes he has been taking it the past few days    In the ED, patient was afebrile, heart rate in 60s, blood pressure in 160s Labs with CBC unremarkable, BMP with potassium of 3.3 Urinalysis unremarkable MRI brain showed 1. Large acute to early subacute right PCA infarct. 2. Small subacute infarct in the splenium of the corpus callosum on the left. 3. Moderate to severe chronic small vessel ischemic disease. 4. Chronic left temporal infarct.                                                                    Hospital Course   Patient was admitted for workup of likely strokes.  Patient was started on aspirin and underwent stroke workup including CTA, brain MRI and echocardiogram.  Patient was seen by the stroke service/neurology service.  MRI showed large subacute to early subacute PCA infarct.  Also showed small subacute infarct in  the corpus callosum and a eft temporal lobe.  Given multifocal nature of the infarcts as well as multiple durations including acute, subacute and chronic, and with the patient's admission that he may well have missed several doses of Eliquis , it was thought that patient's stroke was secondary to missing Eliquis  doses in the setting of ongoing atrial fibrillation.  Recommendations were to discontinue aspirin and restart Eliquis  atorvastatin.  Patient tolerated this well although his course was complicated by intermittent confusion and agitation.  Patient was started on Zyprexa 5 mg p.o. at bedtime with improvement in behavior, he was initially sedated the following morning but has tolerated patient is not discharged on Zyprexa per daughter's request as she is hoping that he will be less disoriented in a more familiar environment.   Right PCA and left corpus callosum infarct with cytotoxic edema but without mass effect.. Likely secondary to missed doses of Eliquis .   Eliquis  restarted, atorvastatin initiated. ns.   2D echocardiogram with LV ejection fraction of 55 to 60%..   No driving due to visual field deficit.   HDL 47, LDL 111, TSH normal at 2.7   Paroxysmal A-fib Has been started on Eliquis , rate is controlled off of rate for medication   Hypertension Amlodipine     History of psoriasis  Ongoing management per outpatient dermatology   Hypothyroidism Continue Synthroid    Glaucoma Continue present medications   Constipation Continue MiraLAX and Dulcolax suppository as needed      Discharge diagnosis     Principal Problem:   Acute stroke due to ischemia [facility])    Discharge instructions    Discharge Instructions     Discharge instructions   Complete by: As directed    It is very important that patient continue to take Eliquis  missing any doses.  Atorvastatin has been added for future stroke prevention as well.  He will need to follow-up with his PCP and/or neurology in the next 1 to 2 weeks.  Zyprexa was started in the hospital was not continued request.  [facility] can make a decision about the need for this. Given visual deficits, neurology is recommending that patient not drive or operate any  machinery.       Discharge Medications   Allergies as of 09/01/2023   No Known Allergies      Medication List     TAKE these medications    amLODipine  5 MG tablet Commonly known as: NORVASC  Take 5 mg by mouth daily. What changed: Another medication with the same name was removed. Continue taking this medication, and follow the directions you see here.   apixaban  5 MG Tabs tablet Commonly known as: Eliquis  Take 1 tablet (5 mg total) by mouth 2 (two) times daily. NEEDS LABS FOR ELIQUIS  REFILLS, COME TO OFFICE.  THANK YOU What changed: additional instructions   atorvastatin 40 MG tablet Commonly known as: LIPITOR Take 1 tablet (40 mg total) by mouth at bedtime.   bimatoprost 0.01 % Soln Commonly known as: LUMIGAN Place 1 drop into the right eye at bedtime.   bisacodyl 10 MG suppository Commonly known as:  DULCOLAX Place 1 suppository (10 mg total) rectally daily as needed for moderate constipation.   Combigan  0.2-0.5 % ophthalmic solution Generic drug: brimonidine -timolol  Place 1 drop into both eyes in the morning and at bedtime.   cyanocobalamin  1000 MCG tablet Commonly known as: VITAMIN B12 Take 1,000 mcg by mouth daily.   dorzolamide 2 % ophthalmic solution Commonly known as: TRUSOPT Place  1 drop into both eyes in the morning and at bedtime.   levothyroxine  75 MCG tablet Commonly known as: SYNTHROID  Take 75 mcg by mouth See admin instructions. Take 75 mcg by mouth five hours after taking Omeprazole   Metamucil 4 in 1 Fiber 55.6 % Powd Generic drug: Psyllium Take 3.4 g by mouth See admin instructions. Mix 3.4 grams into a glass of water and drink by mouth at bedtime   MULTIVITAMIN ADULT PO Take 1 tablet by mouth daily with breakfast.   omeprazole 40 MG capsule Commonly known as: PRILOSEC Take 40 mg by mouth daily before breakfast.          Major procedures and Radiology Reports - PLEASE review detailed and final reports thoroughly  -      ECHOCARDIOGRAM COMPLETE Result Date: 08/30/2023    ECHOCARDIOGRAM REPORT   Patient Name:   DAILY CRATE Date of Exam: 08/30/2023 Medical Rec #:  [id number]       Height:       73.0 in Accession #:    [id number]      Weight:       165.3 lb Date of Birth:  [birth year]       BSA:          1.984 m Patient Age:    89 years        BP:           140/72 mmHg Patient Gender: M               HR:           57 bpm. Exam Location:  Inpatient Procedure: 2D Echo, Cardiac Doppler and Color Doppler (Both Spectral and Color            Flow Doppler were utilized during procedure). Indications:    Stroke  History:        Patient has prior history of Echocardiogram examinations.                 Arrythmias:Atrial Flutter; Risk Factors:Hypertension and Former                 Smoker.  Sonographer:    Willey Harrier Referring Phys: [id number] BINAYA DAHAL IMPRESSIONS   1. Left ventricular ejection fraction, by estimation, is 55 to 60%. The left ventricle has normal function. The left ventricle has no regional wall motion abnormalities. Left ventricular diastolic function could not be evaluated.  2. Right ventricular systolic function is normal. The right ventricular size is normal.  3. The mitral valve is normal in structure. No evidence of mitral valve regurgitation. No evidence of mitral stenosis.  4. The aortic valve is normal in structure. Aortic valve regurgitation is not visualized. No aortic stenosis is present.  5. Aortic dilatation noted. There is borderline dilatation of the ascending aorta, measuring 39 mm.  6. The inferior vena cava is normal in size with greater than 50% respiratory variability, suggesting right atrial pressure of 3 mmHg. FINDINGS  Left Ventricle: Left ventricular ejection fraction, by estimation, is 55 to 60%. The left ventricle has normal function. The left ventricle has no regional wall motion abnormalities. The left ventricular internal cavity size was normal in size. There is  no left ventricular hypertrophy. Left ventricular diastolic function could not be evaluated due to atrial fibrillation. Left ventricular diastolic function could not be evaluated. Right Ventricle: The right ventricular size is normal. No increase in right ventricular wall thickness. Right ventricular systolic function is normal. Left  Atrium: Left atrial size was normal in size. Right Atrium: Right atrial size was normal in size. Pericardium: There is no evidence of pericardial effusion. Mitral Valve: The mitral valve is normal in structure. No evidence of mitral valve regurgitation. No evidence of mitral valve stenosis. MV peak gradient, 3.9 mmHg. The mean mitral valve gradient is 1.0 mmHg. Tricuspid Valve: The tricuspid valve is normal in structure. Tricuspid valve regurgitation is not demonstrated. No evidence of tricuspid stenosis. Aortic Valve: The aortic valve is  normal in structure. Aortic valve regurgitation is not visualized. No aortic stenosis is present. Aortic valve peak gradient measures 4.7 mmHg. Pulmonic Valve: The pulmonic valve was normal in structure. Pulmonic valve regurgitation is not visualized. No evidence of pulmonic stenosis. Aorta: Aortic dilatation noted. There is borderline dilatation of the ascending aorta, measuring 39 mm. Venous: The inferior vena cava is normal in size with greater than 50% respiratory variability, suggesting right atrial pressure of 3 mmHg. IAS/Shunts: No atrial level shunt detected by color flow Doppler.  LEFT VENTRICLE PLAX 2D LVIDd:         4.90 cm   Diastology LVIDs:         3.30 cm   LV e' medial:    5.66 cm/s LV PW:         1.10 cm   LV E/e' medial:  11.1 LV IVS:        1.10 cm   LV e' lateral:   5.66 cm/s LVOT diam:     2.10 cm   LV E/e' lateral: 11.1 LV SV:         66 LV SV Index:   33 LVOT Area:     3.46 cm  RIGHT VENTRICLE          IVC RV Basal diam:  3.90 cm  IVC diam: 1.80 cm LEFT ATRIUM             Index        RIGHT ATRIUM           Index LA Vol (A2C):   60.0 ml 30.23 ml/m  RA Area:     19.20 cm LA Vol (A4C):   45.1 ml 22.73 ml/m  RA Volume:   49.70 ml  25.04 ml/m LA Biplane Vol: 52.5 ml 26.46 ml/m  AORTIC VALVE AV Area (Vmax): 2.66 cm AV Vmax:        108.00 cm/s AV Peak Grad:   4.7 mmHg LVOT Vmax:      82.90 cm/s LVOT Vmean:     55.400 cm/s LVOT VTI:       0.191 m  AORTA Ao Root diam: 3.50 cm Ao Asc diam:  3.90 cm MITRAL VALVE               TRICUSPID VALVE MV Area (PHT): 2.38 cm    TR Peak grad:   29.2 mmHg MV Area VTI:   2.68 cm    TR Vmax:        270.00 cm/s MV Peak grad:  3.9 mmHg MV Mean grad:  1.0 mmHg    SHUNTS MV Vmax:       0.99 m/s    Systemic VTI:  0.19 m MV Vmean:      51.1 cm/s   Systemic Diam: 2.10 cm MV Decel Time: 319 msec MV E velocity: 62.60 cm/s MV A velocity: 81.40 cm/s MV E/A ratio:  0.77 Aditya Sabharwal Electronically signed by Alwin Baars Signature Date/Time: 08/30/2023/3:05:15 PM     Final  CT ANGIO HEAD NECK W WO CM Result Date: 08/29/2023 EXAM: CT HEAD WITHOUT CTA HEAD AND NECK WITH AND WITHOUT 08/29/2023 06:31:52 PM TECHNIQUE: CTA of the head and neck was performed with and without the administration of intravenous contrast. Noncontrast CT of the head with reconstructed 2-D images are also provided for review. Multiplanar 2D and/or 3D reformatted images are provided for review. Automated exposure control, iterative reconstruction, and/or weight based adjustment of the mA/kV was utilized to reduce the radiation dose to as low as reasonably achievable. COMPARISON: MRI of the head without contrast 08/29/2023 CLINICAL HISTORY: Neuro deficit, acute, stroke suspected; concern for right PCA infarct. Notes from triage: Patient to ED by EMS from [facility] for AMS. Per EMS he has been having frequent urination, confusion and hallucinations for a few days. FINDINGS: CT HEAD: BRAIN AND VENTRICLES: The non-contrast head CT demonstrates an acute/subacute non-hemorrhagic infarct in the posterior inferior right temporal lobe and inferior right occipital lobe. The area of acute infarction in the left splenium of the corpus callosum is not appreciated by non-contrast head CT. Moderate confluent periventricular white matter hypoattenuation is again noted bilaterally. ORBITS: The visualized portion of the orbits demonstrate no acute abnormality. SINUSES: The visualized paranasal sinuses and mastoid air cells demonstrate no acute abnormality. SOFT TISSUES AND SKULL: No acute abnormality of the visualized skull or soft tissues. CTA NECK: AORTIC ARCH AND ARCH VESSELS: The aortic arch is incompletely imaged. Atherosclerotic changes are present in the left subclavian artery without focal stenosis. CERVICAL CAROTID ARTERIES: Atherosclerotic changes are present at the bifurcation of proximal right ICA without significant stenosis. Mild tortuosity is present in the distal cervical right ICA  without significant stenosis. Scattered faint calcifications are present in the distal left common carotid artery without significant stenosis. Atherosclerotic calcifications at the left carotid artery are more prominent than on the right. No significant stenosis is present relative to the more distal vessel. Mild tortuosity of the cervical left ICA is present just below the bifurcation without significant stenosis. CERVICAL VERTEBRAL ARTERIES: The left vertebral artery is the dominant vessel. Atherosclerotic calcifications are present at the dural margin of the right vertebral artery. A 50% stenosis is present. SOFT TISSUES: The lung apices are clear. No cervical or superior mediastinal lymphadenopathy. The larynx and pharynx are unremarkable. No acute abnormality of the salivary and thyroid  glands. BONES: Multilevel degenerative changes are present in the cervical spine. Straightening of the normal cervical lordosis is present. Moderate-to-severe foraminal stenosis is present bilaterally at C4-5 and on the right at C3-4. No focal osseous lesions are present. CTA HEAD: ANTERIOR CIRCULATION: Atherosclerotic calcifications are present within the cavernous internal carotid arteries bilaterally without significant stenosis through the ICA termini. Mild Narrowing is present in the nondominant right A1 segment. Scattered distal segmental narrowing is present within the ACA and MCA branch vessels consistent with intracranial atherosclerotic disease. No significant proximal stenosis or occlusion is present in the ACA or MCA branch vessels. No aneurysm. POSTERIOR CIRCULATION: The posterior cerebral arteries originate from the basilar tip. Moderate stenoses are present in the P2 segments bilaterally, left greater than right. No aneurysm. OTHER: No dural venous sinus thrombosis on this non-dedicated study. IMPRESSION: 1. Acute/subacute non-hemorrhagic infarct in the posterior inferior right temporal lobe and inferior right  occipital lobe. 2. Moderate stenoses in the P2 segments of the posterior cerebral arteries bilaterally, left greater than right. 3. Atherosclerotic changes at the carotid bifurcations bilaterally without significant stenosis. 4. Tortuosity of the distal cervical internal carotid arteries  bilaterally without focal stenosis. This is nonspecific, but suggests chronic hypertension. 5. Atherosclerotic calcifications at the dural margin of the right vertebral artery with 50% stenosis. Electronically signed by: Audree Leas MD 08/29/2023 07:47 PM EDT RP Workstation: [id number]   MR BRAIN WO CONTRAST Result Date: 08/29/2023 CLINICAL DATA:  Neuro deficit, acute, stroke suspected. Left sided hemianopsia, visual hallucinations. EXAM: MRI HEAD WITHOUT CONTRAST TECHNIQUE: Multiplanar, multiecho pulse sequences of the brain and surrounding structures were obtained without intravenous contrast. COMPARISON:  None Available. FINDINGS: Brain: There is a large acute to early subacute right PCA infarct involving the inferior right occipital and posterior temporal lobes. There is also a 1 cm subacute infarct involving the left lateral aspect of the splenium of the corpus callosum. There is well-defined cytotoxic edema associated with the right PCA infarct without significant mass effect. No intracranial hemorrhage, midline shift, hydrocephalus, or extra-axial fluid collection is identified. A small chronic infarct is noted in the posterolateral left temporal lobe, and there is also a tiny chronic cortical infarct in the left precentral gyrus. Patchy to confluent T2 hyperintensities elsewhere in cerebral white matter bilaterally are nonspecific but compatible with moderate to severe chronic small vessel ischemic disease. There are small chronic bilateral cerebellar infarcts. There is moderate cerebral atrophy. Vascular: Major intracranial vascular flow voids are preserved. Skull and upper cervical spine: Unremarkable bone  marrow signal. Sinuses/Orbits: Bilateral cataract extraction. Minimal mucosal thickening in the left maxillary sinus. Clear mastoid air cells. Other: None. These results were called by telephone at the time of interpretation on 08/29/2023 at 3:41 pm to Dr. Jerald Molly , who verbally acknowledged these results. IMPRESSION: 1. Large acute to early subacute right PCA infarct. 2. Small subacute infarct in the splenium of the corpus callosum on the left. 3. Moderate to severe chronic small vessel ischemic disease. 4. Chronic left temporal infarct. Electronically Signed   By: Aundra Lee M.D.   On: 08/29/2023 15:45    Micro Results    No results found for this or any previous visit (from the past 240 hours).  Today   Subjective    [place] requesting to be discharged, [facility] with [facility].  Daughter at bedside.  Denies chest pain, shortness of breath or abdominal pain.    Objective   Blood pressure (!) 159/72, pulse (!) 52, temperature 97.8 F (36.6 C), temperature source Oral, resp. rate 18, height 6\' 1"  (1.854 m), weight 74.6 kg, SpO2 97%.  No intake or output data in the 24 hours ending 09/01/23 1620  Exam General: Thin chronically ill-appearing man lying in bed, answering simple questions appropriately.  Eyes: sclera anicteric, conjuctiva mild injection bilaterally CVS: S1-S2, regular  Respiratory:  decreased air entry bilaterally secondary to decreased inspiratory effort, rales at bases  GI: NABS, soft, NT  LE: No edema.     Data Review   CBC w Diff:  Lab Results  Component Value Date   WBC 7.4 09/01/2023   HGB 14.5 09/01/2023   HGB 13.0 02/12/2019   HCT 45.6 09/01/2023   HCT 38.5 02/12/2019   PLT 175 09/01/2023   PLT 181 02/12/2019    CMP:  Lab Results  Component Value Date   NA 139 09/01/2023   NA 139 06/08/2021   K 4.0 09/01/2023   CL 107 09/01/2023   CO2 23 09/01/2023   BUN 18 09/01/2023   BUN 13 06/08/2021   CREATININE 1.12 09/01/2023   PROT  6.3 (L) 08/29/2023   PROT 6.9 02/12/2019   ALBUMIN  3.3 (L) 08/29/2023   ALBUMIN 4.1 02/12/2019   BILITOT 1.1 08/29/2023   BILITOT 0.4 02/12/2019   ALKPHOS 53 08/29/2023   AST 20 08/29/2023   ALT 9 08/29/2023  .   Total Time in preparing paper work, data evaluation and todays exam - 35 minutes  Jachin Coury Tublu Hazem Kenner M.D on 09/01/2023 at 4:20 PM  [facility]

## 2023-09-01 NOTE — Progress Notes (Signed)
 Discharge instructions called to L[facility] [employer].  IV access removed.  Discharge instructions sent with daughter.  Transported to car in w/c accompanied by tech and daughter.

## 2023-09-04 ENCOUNTER — Encounter: Payer: Self-pay | Admitting: Nurse Practitioner

## 2023-09-04 ENCOUNTER — Non-Acute Institutional Stay (SKILLED_NURSING_FACILITY): Payer: Self-pay | Admitting: Nurse Practitioner

## 2023-09-04 DIAGNOSIS — K219 Gastro-esophageal reflux disease without esophagitis: Secondary | ICD-10-CM

## 2023-09-04 DIAGNOSIS — I10 Essential (primary) hypertension: Secondary | ICD-10-CM | POA: Diagnosis not present

## 2023-09-04 DIAGNOSIS — I639 Cerebral infarction, unspecified: Secondary | ICD-10-CM

## 2023-09-04 DIAGNOSIS — L409 Psoriasis, unspecified: Secondary | ICD-10-CM | POA: Diagnosis not present

## 2023-09-04 DIAGNOSIS — I483 Typical atrial flutter: Secondary | ICD-10-CM | POA: Diagnosis not present

## 2023-09-04 DIAGNOSIS — K5901 Slow transit constipation: Secondary | ICD-10-CM

## 2023-09-04 DIAGNOSIS — E785 Hyperlipidemia, unspecified: Secondary | ICD-10-CM | POA: Insufficient documentation

## 2023-09-04 DIAGNOSIS — E039 Hypothyroidism, unspecified: Secondary | ICD-10-CM

## 2023-09-04 DIAGNOSIS — H409 Unspecified glaucoma: Secondary | ICD-10-CM | POA: Diagnosis not present

## 2023-09-04 NOTE — Assessment & Plan Note (Signed)
eyedrops

## 2023-09-04 NOTE — Progress Notes (Unsigned)
 Location:   [facility] Room Number: 23 Place of Service:  SNF (31) Provider: Abner Hoffman Astaria Nanez NP  Rae Bugler, MD  Patient Care Team: Rae Bugler, MD as PCP - General (Family Medicine) Sonny Dust, MD (Inactive) as PCP - Cardiology (Cardiology)  Extended Emergency Contact Information Primary Emergency Contact: Skibicki,LISA Home Phone: [phone] Relation: Daughter Secondary Emergency Contact: Willetts,Teresa  [place]: [phone] Relation: Daughter  Code Status: DNR Goals of care: Advanced Directive information    08/29/2023   12:34 PM  Advanced Directives  Does Patient Have a Medical Advance Directive? No  Would patient like information on creating a medical advance directive? No - Patient declined     Chief Complaint  Patient presents with  . Acute Visit    Medication review    HPI:  Pt is a 89 y.o. male seen today for an acute visit for medication review following hospitalization   Hospitalized for strokes. Presented with visual hallucination/disturbance, mostly saw animals, other strange things like shapes around house, no auditory hallucinations, ?confusion. CTA/MRI 1. Large acute to early subacute right PCA infarct. 2. Small subacute infarct in the splenium of the corpus callosum on the left. 3. Moderate to severe chronic small vessel ischemic disease. 4. Chronic left temporal infarct. On Eliquis , placed on Atorvastatin. HPOA declined continuation of Zyprexa.   HLD LDL 111 08/30/23, on Atorvastatin  HTN, controlled, on Amlodipine .   PAF, on Eliquis , off rate control meds.   Psoriasis on Enbrel, followed by Dermatology  Hypothyroidism, on Levothyroxine , TSH 2.771 08/30/23  Glaucoma, eye drops  Constipation, MiraLax, Dulcolax available to him.   GERD, taking Omeprazole     Past Medical History:  Diagnosis Date  . Glaucoma   . Hypertension   . Psoriasis    Past Surgical History:  Procedure Laterality Date   . GLAUCOMA SURGERY Left   . HERNIA REPAIR    . HYDROCELE EXCISION / REPAIR      No Known Allergies  Allergies as of 09/04/2023   No Known Allergies      Medication List        Accurate as of Sep 04, 2023  4:40 PM. If you have any questions, ask your nurse or doctor.          amLODipine  5 MG tablet Commonly known as: NORVASC  Take 5 mg by mouth daily.   apixaban  5 MG Tabs tablet Commonly known as: Eliquis  Take 1 tablet (5 mg total) by mouth 2 (two) times daily. NEEDS LABS FOR ELIQUIS  REFILLS, COME TO OFFICE.  THANK YOU What changed: additional instructions   atorvastatin 40 MG tablet Commonly known as: LIPITOR Take 1 tablet (40 mg total) by mouth at bedtime.   bimatoprost 0.01 % Soln Commonly known as: LUMIGAN Place 1 drop into the right eye at bedtime.   bisacodyl 10 MG suppository Commonly known as: DULCOLAX Place 1 suppository (10 mg total) rectally daily as needed for moderate constipation.   Combigan  0.2-0.5 % ophthalmic solution Generic drug: brimonidine -timolol  Place 1 drop into both eyes in the morning and at bedtime.   cyanocobalamin  1000 MCG tablet Commonly known as: VITAMIN B12 Take 1,000 mcg by mouth daily.   dorzolamide 2 % ophthalmic solution Commonly known as: TRUSOPT Place 1 drop into both eyes in the morning and at bedtime.   levothyroxine  75 MCG tablet Commonly known as: SYNTHROID  Take 75 mcg by mouth See admin instructions. Take 75 mcg by mouth five hours after taking Omeprazole  Metamucil 4 in 1 Fiber 55.6 % Powd Generic drug: Psyllium Take 3.4 g by mouth See admin instructions. Mix 3.4 grams into a glass of water and drink by mouth at bedtime   MULTIVITAMIN ADULT PO Take 1 tablet by mouth daily with breakfast.   omeprazole 40 MG capsule Commonly known as: PRILOSEC Take 40 mg by mouth daily before breakfast.        Review of Systems  Constitutional:  Negative for appetite change, fatigue and fever.  HENT:  Negative for  congestion and trouble swallowing.   Eyes:  Positive for visual disturbance.       Visual hallucination vs vision disturbance vs peripheral visual field deficits.   Respiratory:  Positive for cough. Negative for shortness of breath and wheezing.        Occasional cough   Cardiovascular:  Positive for leg swelling.  Gastrointestinal:  Positive for constipation. Negative for abdominal pain, nausea and vomiting.  Genitourinary:  Negative for dysuria and urgency.  Musculoskeletal:  Positive for arthralgias and gait problem.  Skin:  Negative for color change.  Neurological:  Positive for weakness. Negative for light-headedness.  Psychiatric/Behavioral:  Positive for confusion and hallucinations. Negative for behavioral problems and sleep disturbance.      There is no immunization history on file for this patient. Pertinent  Health Maintenance Due  Topic Date Due  . INFLUENZA VACCINE  11/09/2023      10/09/2018   12:25 PM 10/09/2018    8:00 PM 10/10/2018    8:00 AM 10/10/2018   10:15 PM 10/11/2018    8:37 AM  Fall Risk  (RETIRED) Patient Fall Risk Level Low fall risk Moderate fall risk Moderate fall risk Moderate fall risk Moderate fall risk   Functional Status Survey:    Vitals:   09/04/23 1104  BP: (!) 148/90  Pulse: (!) 58  Resp: 19  Temp: 97.7 F (36.5 C)  SpO2: 98%  Weight: 165 lb 4.8 oz (75 kg)   Body mass index is 21.81 kg/m. Physical Exam Vitals and nursing note reviewed.  Constitutional:      Appearance: Normal appearance.  HENT:     Head: Normocephalic and atraumatic.     Nose: Nose normal.     Mouth/Throat:     Mouth: Mucous membranes are moist.  Eyes:     Extraocular Movements: Extraocular movements intact.     Conjunctiva/sclera: Conjunctivae normal.     Comments: Right pupil fixed, small.   Cardiovascular:     Rate and Rhythm: Normal rate and regular rhythm.     Heart sounds: No murmur heard. Pulmonary:     Effort: Pulmonary effort is normal.     Breath  sounds: No wheezing, rhonchi or rales.  Abdominal:     General: Bowel sounds are normal.     Palpations: Abdomen is soft.     Tenderness: There is no abdominal tenderness.  Musculoskeletal:        General: No tenderness. Normal range of motion.     Cervical back: Normal range of motion and neck supple.     Right lower leg: Edema present.     Left lower leg: Edema present.     Comments: Trace edema BLE  Skin:    General: Skin is warm and dry.     Findings: No rash.  Neurological:     General: No focal deficit present.     Mental Status: He is alert and oriented to person, place, and time. Mental status is at  baseline.     Motor: Weakness present.     Coordination: Coordination normal.     Gait: Gait abnormal.     Comments: Mild right facial weakness.   Psychiatric:        Mood and Affect: Mood normal.        Behavior: Behavior normal.        Thought Content: Thought content normal.        Judgment: Judgment normal.    Labs reviewed: Recent Labs    08/30/23 0500 08/31/23 0633 09/01/23 0742  NA 133* 139 139  K 3.1* 3.4* 4.0  CL 101 105 107  CO2 25 26 23   GLUCOSE 95 95 95  BUN 16 18 18   CREATININE 0.85 1.04 1.12  CALCIUM 8.6* 8.9 9.2  MG  --   --  2.1   Recent Labs    08/29/23 1240  AST 20  ALT 9  ALKPHOS 53  BILITOT 1.1  PROT 6.3*  ALBUMIN 3.3*   Recent Labs    08/29/23 1240 08/30/23 0500 09/01/23 0742  WBC 7.3 7.4 7.4  HGB 13.5 13.4 14.5  HCT 40.6 40.0 45.6  MCV 95.5 93.7 98.7  PLT 180 180 175   Lab Results  Component Value Date   TSH 2.771 08/30/2023   Lab Results  Component Value Date   HGBA1C 5.0 08/29/2023   Lab Results  Component Value Date   CHOL 173 08/30/2023   HDL 47 08/30/2023   LDLCALC 111 (H) 08/30/2023   TRIG 74 08/30/2023   CHOLHDL 3.7 08/30/2023    Significant Diagnostic Results in last 30 days:  ECHOCARDIOGRAM COMPLETE Result Date: 08/30/2023    ECHOCARDIOGRAM REPORT   Patient Name:   Neil [name] Date of Exam:  08/30/2023 Medical Rec #:  [id number]       Height:       73.0 in Accession #:    [id number]      Weight:       165.3 lb Date of Birth:  08/13/1929       BSA:          1.984 m Patient Age:    89 years        BP:           140/72 mmHg Patient Gender: M               HR:           57 bpm. Exam Location:  Inpatient Procedure: 2D Echo, Cardiac Doppler and Color Doppler (Both Spectral and Color            Flow Doppler were utilized during procedure). Indications:    Stroke  History:        Patient has prior history of Echocardiogram examinations.                 Arrythmias:Atrial Flutter; Risk Factors:Hypertension and Former                 Smoker.  Sonographer:    Willey Harrier Referring Phys: [id number] BINAYA DAHAL IMPRESSIONS  1. Left ventricular ejection fraction, by estimation, is 55 to 60%. The left ventricle has normal function. The left ventricle has no regional wall motion abnormalities. Left ventricular diastolic function could not be evaluated.  2. Right ventricular systolic function is normal. The right ventricular size is normal.  3. The mitral valve is normal in structure. No evidence of mitral valve regurgitation. No evidence of mitral stenosis.  4.  The aortic valve is normal in structure. Aortic valve regurgitation is not visualized. No aortic stenosis is present.  5. Aortic dilatation noted. There is borderline dilatation of the ascending aorta, measuring 39 mm.  6. The inferior vena cava is normal in size with greater than 50% respiratory variability, suggesting right atrial pressure of 3 mmHg. FINDINGS  Left Ventricle: Left ventricular ejection fraction, by estimation, is 55 to 60%. The left ventricle has normal function. The left ventricle has no regional wall motion abnormalities. The left ventricular internal cavity size was normal in size. There is  no left ventricular hypertrophy. Left ventricular diastolic function could not be evaluated due to atrial fibrillation. Left ventricular diastolic function  could not be evaluated. Right Ventricle: The right ventricular size is normal. No increase in right ventricular wall thickness. Right ventricular systolic function is normal. Left Atrium: Left atrial size was normal in size. Right Atrium: Right atrial size was normal in size. Pericardium: There is no evidence of pericardial effusion. Mitral Valve: The mitral valve is normal in structure. No evidence of mitral valve regurgitation. No evidence of mitral valve stenosis. MV peak gradient, 3.9 mmHg. The mean mitral valve gradient is 1.0 mmHg. Tricuspid Valve: The tricuspid valve is normal in structure. Tricuspid valve regurgitation is not demonstrated. No evidence of tricuspid stenosis. Aortic Valve: The aortic valve is normal in structure. Aortic valve regurgitation is not visualized. No aortic stenosis is present. Aortic valve peak gradient measures 4.7 mmHg. Pulmonic Valve: The pulmonic valve was normal in structure. Pulmonic valve regurgitation is not visualized. No evidence of pulmonic stenosis. Aorta: Aortic dilatation noted. There is borderline dilatation of the ascending aorta, measuring 39 mm. Venous: The inferior vena cava is normal in size with greater than 50% respiratory variability, suggesting right atrial pressure of 3 mmHg. IAS/Shunts: No atrial level shunt detected by color flow Doppler.  LEFT VENTRICLE PLAX 2D LVIDd:         4.90 cm   Diastology LVIDs:         3.30 cm   LV e' medial:    5.66 cm/s LV PW:         1.10 cm   LV E/e' medial:  11.1 LV IVS:        1.10 cm   LV e' lateral:   5.66 cm/s LVOT diam:     2.10 cm   LV E/e' lateral: 11.1 LV SV:         66 LV SV Index:   33 LVOT Area:     3.46 cm  RIGHT VENTRICLE          IVC RV Basal diam:  3.90 cm  IVC diam: 1.80 cm LEFT ATRIUM             Index        RIGHT ATRIUM           Index LA Vol (A2C):   60.0 ml 30.23 ml/m  RA Area:     19.20 cm LA Vol (A4C):   45.1 ml 22.73 ml/m  RA Volume:   49.70 ml  25.04 ml/m LA Biplane Vol: 52.5 ml 26.46 ml/m   AORTIC VALVE AV Area (Vmax): 2.66 cm AV Vmax:        108.00 cm/s AV Peak Grad:   4.7 mmHg LVOT Vmax:      82.90 cm/s LVOT Vmean:     55.400 cm/s LVOT VTI:       0.191 m  AORTA Ao Root diam: 3.50  cm Ao Asc diam:  3.90 cm MITRAL VALVE               TRICUSPID VALVE MV Area (PHT): 2.38 cm    TR Peak grad:   29.2 mmHg MV Area VTI:   2.68 cm    TR Vmax:        270.00 cm/s MV Peak grad:  3.9 mmHg MV Mean grad:  1.0 mmHg    SHUNTS MV Vmax:       0.99 m/s    Systemic VTI:  0.19 m MV Vmean:      51.1 cm/s   Systemic Diam: 2.10 cm MV Decel Time: 319 msec MV E velocity: 62.60 cm/s MV A velocity: 81.40 cm/s MV E/A ratio:  0.77 Aditya Sabharwal Electronically signed by Alwin Baars Signature Date/Time: 08/30/2023/3:05:15 PM    Final    CT ANGIO HEAD NECK W WO CM Result Date: 08/29/2023 EXAM: CT HEAD WITHOUT CTA HEAD AND NECK WITH AND WITHOUT 08/29/2023 06:31:52 PM TECHNIQUE: CTA of the head and neck was performed with and without the administration of intravenous contrast. Noncontrast CT of the head with reconstructed 2-D images are also provided for review. Multiplanar 2D and/or 3D reformatted images are provided for review. Automated exposure control, iterative reconstruction, and/or weight based adjustment of the mA/kV was utilized to reduce the radiation dose to as low as reasonably achievable. COMPARISON: MRI of the head without contrast 08/29/2023 CLINICAL HISTORY: Neuro deficit, acute, stroke suspected; concern for right PCA infarct. Notes from triage: Patient to ED by EMS from [facility] for AMS. Per EMS he has been having frequent urination, confusion and hallucinations for a few days. FINDINGS: CT HEAD: BRAIN AND VENTRICLES: The non-contrast head CT demonstrates an acute/subacute non-hemorrhagic infarct in the posterior inferior right temporal lobe and inferior right occipital lobe. The area of acute infarction in the left splenium of the corpus callosum is not appreciated by non-contrast  head CT. Moderate confluent periventricular white matter hypoattenuation is again noted bilaterally. ORBITS: The visualized portion of the orbits demonstrate no acute abnormality. SINUSES: The visualized paranasal sinuses and mastoid air cells demonstrate no acute abnormality. SOFT TISSUES AND SKULL: No acute abnormality of the visualized skull or soft tissues. CTA NECK: AORTIC ARCH AND ARCH VESSELS: The aortic arch is incompletely imaged. Atherosclerotic changes are present in the left subclavian artery without focal stenosis. CERVICAL CAROTID ARTERIES: Atherosclerotic changes are present at the bifurcation of proximal right ICA without significant stenosis. Mild tortuosity is present in the distal cervical right ICA without significant stenosis. Scattered faint calcifications are present in the distal left common carotid artery without significant stenosis. Atherosclerotic calcifications at the left carotid artery are more prominent than on the right. No significant stenosis is present relative to the more distal vessel. Mild tortuosity of the cervical left ICA is present just below the bifurcation without significant stenosis. CERVICAL VERTEBRAL ARTERIES: The left vertebral artery is the dominant vessel. Atherosclerotic calcifications are present at the dural margin of the right vertebral artery. A 50% stenosis is present. SOFT TISSUES: The lung apices are clear. No cervical or superior mediastinal lymphadenopathy. The larynx and pharynx are unremarkable. No acute abnormality of the salivary and thyroid  glands. BONES: Multilevel degenerative changes are present in the cervical spine. Straightening of the normal cervical lordosis is present. Moderate-to-severe foraminal stenosis is present bilaterally at C4-5 and on the right at C3-4. No focal osseous lesions are present. CTA HEAD: ANTERIOR CIRCULATION: Atherosclerotic calcifications are present within the  cavernous internal carotid arteries bilaterally without  significant stenosis through the ICA termini. Mild Narrowing is present in the nondominant right A1 segment. Scattered distal segmental narrowing is present within the ACA and MCA branch vessels consistent with intracranial atherosclerotic disease. No significant proximal stenosis or occlusion is present in the ACA or MCA branch vessels. No aneurysm. POSTERIOR CIRCULATION: The posterior cerebral arteries originate from the basilar tip. Moderate stenoses are present in the P2 segments bilaterally, left greater than right. No aneurysm. OTHER: No dural venous sinus thrombosis on this non-dedicated study. IMPRESSION: 1. Acute/subacute non-hemorrhagic infarct in the posterior inferior right temporal lobe and inferior right occipital lobe. 2. Moderate stenoses in the P2 segments of the posterior cerebral arteries bilaterally, left greater than right. 3. Atherosclerotic changes at the carotid bifurcations bilaterally without significant stenosis. 4. Tortuosity of the distal cervical internal carotid arteries bilaterally without focal stenosis. This is nonspecific, but suggests chronic hypertension. 5. Atherosclerotic calcifications at the dural margin of the right vertebral artery with 50% stenosis. Electronically signed by: Audree Leas MD 08/29/2023 07:47 PM EDT RP Workstation: [id number]   MR BRAIN WO CONTRAST Result Date: 08/29/2023 CLINICAL DATA:  Neuro deficit, acute, stroke suspected. Left sided hemianopsia, visual hallucinations. EXAM: MRI HEAD WITHOUT CONTRAST TECHNIQUE: Multiplanar, multiecho pulse sequences of the brain and surrounding structures were obtained without intravenous contrast. COMPARISON:  None Available. FINDINGS: Brain: There is a large acute to early subacute right PCA infarct involving the inferior right occipital and posterior temporal lobes. There is also a 1 cm subacute infarct involving the left lateral aspect of the splenium of the corpus callosum. There is well-defined cytotoxic  edema associated with the right PCA infarct without significant mass effect. No intracranial hemorrhage, midline shift, hydrocephalus, or extra-axial fluid collection is identified. A small chronic infarct is noted in the posterolateral left temporal lobe, and there is also a tiny chronic cortical infarct in the left precentral gyrus. Patchy to confluent T2 hyperintensities elsewhere in cerebral white matter bilaterally are nonspecific but compatible with moderate to severe chronic small vessel ischemic disease. There are small chronic bilateral cerebellar infarcts. There is moderate cerebral atrophy. Vascular: Major intracranial vascular flow voids are preserved. Skull and upper cervical spine: Unremarkable bone marrow signal. Sinuses/Orbits: Bilateral cataract extraction. Minimal mucosal thickening in the left maxillary sinus. Clear mastoid air cells. Other: None. These results were called by telephone at the time of interpretation on 08/29/2023 at 3:41 pm to Dr. Jerald Molly , who verbally acknowledged these results. IMPRESSION: 1. Large acute to early subacute right PCA infarct. 2. Small subacute infarct in the splenium of the corpus callosum on the left. 3. Moderate to severe chronic small vessel ischemic disease. 4. Chronic left temporal infarct. Electronically Signed   By: Aundra Lee M.D.   On: 08/29/2023 15:45    Assessment/Plan: Acute stroke due to ischemia [facility]) Presented with visual hallucination, mostly saw animals, other strange things like shapes around house, no auditory hallucinations. CTA/MRI 1. Large acute to early subacute right PCA infarct. 2. Small subacute infarct in the splenium of the corpus callosum on the left. 3. Moderate to severe chronic small vessel ischemic disease. 4. Chronic left temporal infarct. On Eliquis , placed on Atorvastatin. HPOA declined continuation of Zyprexa.   HLD (hyperlipidemia) LDL 111 08/30/23, on Atorvastatin  HTN (hypertension) Blood pressure is  controlled, on Amlodipine .   Atrial flutter (HCC)  on Eliquis , off rate control meds.   Psoriasis  on Enbrel, followed by Dermatology  Hypothyroidism on Levothyroxine , TSH 2.771  08/30/23  Glaucoma eye drops  Slow transit constipation MiraLax, Dulcolax available to him.   GERD (gastroesophageal reflux disease) Stable, continue Omeprazole.     Family/ staff Communication: plan of care reviewed with the patient and charge nurse.   Labs/tests ordered:  none

## 2023-09-04 NOTE — Assessment & Plan Note (Signed)
 on Levothyroxine , TSH 2.771 08/30/23

## 2023-09-04 NOTE — Assessment & Plan Note (Signed)
Blood pressure is controlled,  on Amlodipine.

## 2023-09-04 NOTE — Assessment & Plan Note (Signed)
 LDL 111 08/30/23, on Atorvastatin

## 2023-09-04 NOTE — Assessment & Plan Note (Addendum)
 Schedule MiraLax daily, prn Dulcolax available to him.

## 2023-09-04 NOTE — Assessment & Plan Note (Signed)
 Presented with visual hallucination, mostly saw animals, other strange things like shapes around house, no auditory hallucinations. CTA/MRI 1. Large acute to early subacute right PCA infarct. 2. Small subacute infarct in the splenium of the corpus callosum on the left. 3. Moderate to severe chronic small vessel ischemic disease. 4. Chronic left temporal infarct. On Eliquis , placed on Atorvastatin. HPOA declined continuation of Zyprexa.

## 2023-09-04 NOTE — Assessment & Plan Note (Signed)
 on Eliquis , off rate control meds.

## 2023-09-04 NOTE — Assessment & Plan Note (Signed)
 on Enbrel, followed by Dermatology

## 2023-09-04 NOTE — Assessment & Plan Note (Signed)
Stable, continue Omeprazole.  

## 2023-09-07 ENCOUNTER — Non-Acute Institutional Stay: Payer: Self-pay | Admitting: Sports Medicine

## 2023-09-07 ENCOUNTER — Encounter: Payer: Self-pay | Admitting: Sports Medicine

## 2023-09-07 DIAGNOSIS — I10 Essential (primary) hypertension: Secondary | ICD-10-CM

## 2023-09-07 DIAGNOSIS — K5901 Slow transit constipation: Secondary | ICD-10-CM

## 2023-09-07 DIAGNOSIS — I639 Cerebral infarction, unspecified: Secondary | ICD-10-CM

## 2023-09-07 DIAGNOSIS — K219 Gastro-esophageal reflux disease without esophagitis: Secondary | ICD-10-CM | POA: Diagnosis not present

## 2023-09-07 DIAGNOSIS — E785 Hyperlipidemia, unspecified: Secondary | ICD-10-CM

## 2023-09-07 DIAGNOSIS — E039 Hypothyroidism, unspecified: Secondary | ICD-10-CM

## 2023-09-07 NOTE — Progress Notes (Signed)
 Provider:  Dr. Tye Gall Location:  [facility] Place of Service:   Skilled nursing [place] [address]   PCP: Rae Bugler, MD Patient Care Team: Rae Bugler, MD as PCP - General (Family Medicine) Sonny Dust, MD (Inactive) as PCP - Cardiology (Cardiology)  Extended Emergency Contact Information Primary Emergency Contact: Chien,LISA Home Phone: [phone] Relation: Daughter Secondary Emergency Contact: Willetts,Teresa  [place]: [phone] Relation: Daughter  Goals of Care: Advanced Directive information    08/29/2023   12:34 PM  Advanced Directives  Does Patient Have a Medical Advance Directive? No  Would patient like information on creating a medical advance directive? No - Patient declined      No chief complaint on file.      History of Present Illness  89 year old male with a past medical history of hypertension, A-fib, psoriasis, hypothyroidism, glaucoma is seen today for admission to skilled care nursing after his recent hospitalization for acute stroke. Patient seen and examined in his room.  Family at bedside. Patient seems pleasant and comfortable and does not appear to be in distress. Patient reports that he had a slight headache last night but resolved this morning.  Denies nausea, vomiting, blurry or double vision. Daughter reports that he is not eating much.  Denies coughing or choking while eating. Patient denies chest pain, shortness of breath, abdominal pain, nausea, vomiting, dysuria, hematuria, bloody or dark-colored stools. Last bowel movement 2 days ago. He is working with physical therapy.  He is able to stand up from his bed , requiring  one-person assistance.  As per d/c summary   '' Patient was admitted for workup of likely strokes.  Patient was started on aspirin  and underwent stroke workup including CTA, brain MRI and echocardiogram.  Patient was seen by the stroke service/neurology  service.  MRI showed large subacute to early subacute PCA infarct.  Also showed small subacute infarct in the corpus callosum and a eft temporal lobe.  Given multifocal nature of the infarcts as well as multiple durations including acute, subacute and chronic, and with the patient's admission that he may well have missed several doses of Eliquis , it was thought that patient's stroke was secondary to missing Eliquis  doses in the setting of ongoing atrial fibrillation.  Recommendations were to discontinue aspirin  and restart Eliquis  atorvastatin .  Patient tolerated this well although his course was complicated by intermittent confusion and agitation.  Patient was started on Zyprexa  5 mg p.o. at bedtime with improvement in behavior, he was initially sedated the following morning but has tolerated patient is not discharged on Zyprexa  per daughter's request as she is hoping that he will be less disoriented in a more familiar environment. ''    Past Medical History:  Diagnosis Date   Glaucoma    Hypertension    Psoriasis    Past Surgical History:  Procedure Laterality Date   GLAUCOMA SURGERY Left    HERNIA REPAIR     HYDROCELE EXCISION / REPAIR      reports that he has quit smoking. His smoking use included pipe. He has never been exposed to tobacco smoke. He has never used smokeless tobacco. He reports current alcohol use of about 7.0 standard drinks of alcohol per week. He reports that he does not use drugs. Social History   Socioeconomic History   Marital status: Married    Spouse name: Not on file   Number of children: Not on file   Years of education: Not on file  Highest education level: Not on file  Occupational History   Not on file  Tobacco Use   Smoking status: Former    Types: Pipe    Passive exposure: Never   Smokeless tobacco: Never   Tobacco comments:    quit >40 years ago  Vaping Use   Vaping status: Never Used  Substance and Sexual Activity   Alcohol use: Yes     Alcohol/week: 7.0 standard drinks of alcohol    Types: 7 Standard drinks or equivalent per week    Comment: 1 drink daily   Drug use: Never   Sexual activity: Not Currently  Other Topics Concern   Not on file  Social History Narrative   Wife has alzheimer's   Social Drivers of Health   Financial Resource Strain: Not on file  Food Insecurity: No Food Insecurity (08/30/2023)   Hunger Vital Sign    Worried About Running Out of Food in the Last Year: Never true    Ran Out of Food in the Last Year: Never true  Transportation Needs: No Transportation Needs (08/30/2023)   PRAPARE - Administrator, Civil Service (Medical): No    Lack of Transportation (Non-Medical): No  Physical Activity: Not on file  Stress: Not on file  Social Connections: Moderately Integrated (08/30/2023)   Social Connection and Isolation Panel [NHANES]    Frequency of Communication with Friends and Family: Three times a week    Frequency of Social Gatherings with Friends and Family: Three times a week    Attends Religious Services: 1 to 4 times per year    Active Member of Clubs or Organizations: No    Attends Banker Meetings: 1 to 4 times per year    Marital Status: Widowed  Intimate Partner Violence: Not At Risk (08/30/2023)   Humiliation, Afraid, Rape, and Kick questionnaire    Fear of Current or Ex-Partner: No    Emotionally Abused: No    Physically Abused: No    Sexually Abused: No    Functional Status Survey:    Family History  Problem Relation Age of Onset   High blood pressure Mother    Stroke Mother    High blood pressure Sister     Health Maintenance  Topic Date Due   DTaP/Tdap/Td (1 - Tdap) Never done   Pneumonia Vaccine 75+ Years old (1 of 2 - PCV) Never done   COVID-19 Vaccine (1 - 2024-25 season) Never done   INFLUENZA VACCINE  11/09/2023   Medicare Annual Wellness (AWV)  05/23/2024   Zoster Vaccines- Shingrix  Completed   HPV VACCINES  Aged Out   Meningococcal  B Vaccine  Aged Out    No Known Allergies  Outpatient Encounter Medications as of 09/07/2023  Medication Sig   amLODipine  (NORVASC ) 5 MG tablet Take 5 mg by mouth daily.   apixaban  (ELIQUIS ) 5 MG TABS tablet Take 1 tablet (5 mg total) by mouth 2 (two) times daily. NEEDS LABS FOR ELIQUIS  REFILLS, COME TO OFFICE.  THANK YOU (Patient taking differently: Take 5 mg by mouth 2 (two) times daily.)   atorvastatin (LIPITOR) 40 MG tablet Take 1 tablet (40 mg total) by mouth at bedtime.   bimatoprost (LUMIGAN) 0.01 % SOLN Place 1 drop into the right eye at bedtime.   bisacodyl (DULCOLAX) 10 MG suppository Place 1 suppository (10 mg total) rectally daily as needed for moderate constipation.   brimonidine -timolol  (COMBIGAN ) 0.2-0.5 % ophthalmic solution Place 1 drop into both eyes in  the morning and at bedtime.   dorzolamide (TRUSOPT) 2 % ophthalmic solution Place 1 drop into both eyes in the morning and at bedtime.   levothyroxine  (SYNTHROID ) 75 MCG tablet Take 75 mcg by mouth See admin instructions. Take 75 mcg by mouth five hours after taking Omeprazole   Multiple Vitamin (MULTIVITAMIN ADULT PO) Take 1 tablet by mouth daily with breakfast.   omeprazole (PRILOSEC) 40 MG capsule Take 40 mg by mouth daily before breakfast.   Psyllium (METAMUCIL 4 IN 1 FIBER) 55.6 % POWD Take 3.4 g by mouth See admin instructions. Mix 3.4 grams into a glass of water and drink by mouth at bedtime   vitamin B-12 (CYANOCOBALAMIN ) 1000 MCG tablet Take 1,000 mcg by mouth daily.   No facility-administered encounter medications on file as of 09/07/2023.    Review of Systems  Constitutional:  Negative for fever.  Respiratory:  Negative for cough, shortness of breath and wheezing.   Cardiovascular:  Negative for chest pain, palpitations and leg swelling.  Gastrointestinal:  Negative for abdominal distention, abdominal pain, blood in stool, constipation, diarrhea and nausea.  Genitourinary:  Negative for dysuria.  Neurological:   Negative for dizziness, weakness and numbness.    There were no vitals filed for this visit. There is no height or weight on file to calculate BMI. BP Readings from Last 3 Encounters:  09/06/23 134/67  09/01/23 (!) 159/72  06/13/23 128/68   Wt Readings from Last 3 Encounters:  09/04/23 165 lb 4.8 oz (75 kg)  08/30/23 164 lb 7.4 oz (74.6 kg)  06/13/23 161 lb 3.2 oz (73.1 kg)   Physical Exam Constitutional:      Appearance: Normal appearance.  HENT:     Head: Normocephalic and atraumatic.  Cardiovascular:     Rate and Rhythm: Normal rate and regular rhythm.     Pulses: Normal pulses.     Heart sounds: Normal heart sounds.  Pulmonary:     Effort: No respiratory distress.     Breath sounds: No wheezing or rales.  Abdominal:     General: Bowel sounds are normal. There is no distension.     Palpations: Abdomen is soft.     Tenderness: There is no abdominal tenderness. There is no guarding.  Musculoskeletal:        General: No swelling.  Neurological:     Mental Status: He is alert. Mental status is at baseline.     Motor: No weakness.     Comments: Rombergs pos Finger nose neg No nystagmus Strength intact      Labs reviewed: Basic Metabolic Panel: Recent Labs    08/30/23 0500 08/31/23 0633 09/01/23 0742  NA 133* 139 139  K 3.1* 3.4* 4.0  CL 101 105 107  CO2 25 26 23   GLUCOSE 95 95 95  BUN 16 18 18   CREATININE 0.85 1.04 1.12  CALCIUM 8.6* 8.9 9.2  MG  --   --  2.1   Liver Function Tests: Recent Labs    08/29/23 1240  AST 20  ALT 9  ALKPHOS 53  BILITOT 1.1  PROT 6.3*  ALBUMIN 3.3*   No results for input(s): "LIPASE", "AMYLASE" in the last 8760 hours. No results for input(s): "AMMONIA" in the last 8760 hours. CBC: Recent Labs    08/29/23 1240 08/30/23 0500 09/01/23 0742  WBC 7.3 7.4 7.4  HGB 13.5 13.4 14.5  HCT 40.6 40.0 45.6  MCV 95.5 93.7 98.7  PLT 180 180 175   Cardiac Enzymes: No results  for input(s): "CKTOTAL", "CKMB", "CKMBINDEX",  "TROPONINI" in the last 8760 hours. BNP: Invalid input(s): "POCBNP" Lab Results  Component Value Date   HGBA1C 5.0 08/29/2023   Lab Results  Component Value Date   TSH 2.771 08/30/2023   No results found for: "VITAMINB12" No results found for: "FOLATE" No results found for: "IRON", "TIBC", "FERRITIN"  Imaging and Procedures obtained prior to SNF admission: ECHOCARDIOGRAM COMPLETE Result Date: 08/30/2023    ECHOCARDIOGRAM REPORT   Patient Name:   JERMEY CLOSS Date of Exam: 08/30/2023 Medical Rec #:  [id number]       Height:       73.0 in Accession #:    [id number]      Weight:       165.3 lb Date of Birth:  [birth year]       BSA:          1.984 m Patient Age:    89 years        BP:           140/72 mmHg Patient Gender: M               HR:           57 bpm. Exam Location:  Inpatient Procedure: 2D Echo, Cardiac Doppler and Color Doppler (Both Spectral and Color            Flow Doppler were utilized during procedure). Indications:    Stroke  History:        Patient has prior history of Echocardiogram examinations.                 Arrythmias:Atrial Flutter; Risk Factors:Hypertension and Former                 Smoker.  Sonographer:    Willey Harrier Referring Phys: [id number] BINAYA DAHAL IMPRESSIONS  1. Left ventricular ejection fraction, by estimation, is 55 to 60%. The left ventricle has normal function. The left ventricle has no regional wall motion abnormalities. Left ventricular diastolic function could not be evaluated.  2. Right ventricular systolic function is normal. The right ventricular size is normal.  3. The mitral valve is normal in structure. No evidence of mitral valve regurgitation. No evidence of mitral stenosis.  4. The aortic valve is normal in structure. Aortic valve regurgitation is not visualized. No aortic stenosis is present.  5. Aortic dilatation noted. There is borderline dilatation of the ascending aorta, measuring 39 mm.  6. The inferior vena cava is normal in size with greater  than 50% respiratory variability, suggesting right atrial pressure of 3 mmHg. FINDINGS  Left Ventricle: Left ventricular ejection fraction, by estimation, is 55 to 60%. The left ventricle has normal function. The left ventricle has no regional wall motion abnormalities. The left ventricular internal cavity size was normal in size. There is  no left ventricular hypertrophy. Left ventricular diastolic function could not be evaluated due to atrial fibrillation. Left ventricular diastolic function could not be evaluated. Right Ventricle: The right ventricular size is normal. No increase in right ventricular wall thickness. Right ventricular systolic function is normal. Left Atrium: Left atrial size was normal in size. Right Atrium: Right atrial size was normal in size. Pericardium: There is no evidence of pericardial effusion. Mitral Valve: The mitral valve is normal in structure. No evidence of mitral valve regurgitation. No evidence of mitral valve stenosis. MV peak gradient, 3.9 mmHg. The mean mitral valve gradient is 1.0 mmHg. Tricuspid Valve: The tricuspid valve is normal  in structure. Tricuspid valve regurgitation is not demonstrated. No evidence of tricuspid stenosis. Aortic Valve: The aortic valve is normal in structure. Aortic valve regurgitation is not visualized. No aortic stenosis is present. Aortic valve peak gradient measures 4.7 mmHg. Pulmonic Valve: The pulmonic valve was normal in structure. Pulmonic valve regurgitation is not visualized. No evidence of pulmonic stenosis. Aorta: Aortic dilatation noted. There is borderline dilatation of the ascending aorta, measuring 39 mm. Venous: The inferior vena cava is normal in size with greater than 50% respiratory variability, suggesting right atrial pressure of 3 mmHg. IAS/Shunts: No atrial level shunt detected by color flow Doppler.  LEFT VENTRICLE PLAX 2D LVIDd:         4.90 cm   Diastology LVIDs:         3.30 cm   LV e' medial:    5.66 cm/s LV PW:          1.10 cm   LV E/e' medial:  11.1 LV IVS:        1.10 cm   LV e' lateral:   5.66 cm/s LVOT diam:     2.10 cm   LV E/e' lateral: 11.1 LV SV:         66 LV SV Index:   33 LVOT Area:     3.46 cm  RIGHT VENTRICLE          IVC RV Basal diam:  3.90 cm  IVC diam: 1.80 cm LEFT ATRIUM             Index        RIGHT ATRIUM           Index LA Vol (A2C):   60.0 ml 30.23 ml/m  RA Area:     19.20 cm LA Vol (A4C):   45.1 ml 22.73 ml/m  RA Volume:   49.70 ml  25.04 ml/m LA Biplane Vol: 52.5 ml 26.46 ml/m  AORTIC VALVE AV Area (Vmax): 2.66 cm AV Vmax:        108.00 cm/s AV Peak Grad:   4.7 mmHg LVOT Vmax:      82.90 cm/s LVOT Vmean:     55.400 cm/s LVOT VTI:       0.191 m  AORTA Ao Root diam: 3.50 cm Ao Asc diam:  3.90 cm MITRAL VALVE               TRICUSPID VALVE MV Area (PHT): 2.38 cm    TR Peak grad:   29.2 mmHg MV Area VTI:   2.68 cm    TR Vmax:        270.00 cm/s MV Peak grad:  3.9 mmHg MV Mean grad:  1.0 mmHg    SHUNTS MV Vmax:       0.99 m/s    Systemic VTI:  0.19 m MV Vmean:      51.1 cm/s   Systemic Diam: 2.10 cm MV Decel Time: 319 msec MV E velocity: 62.60 cm/s MV A velocity: 81.40 cm/s MV E/A ratio:  0.77 Aditya Sabharwal Electronically signed by Alwin Baars Signature Date/Time: 08/30/2023/3:05:15 PM    Final    CT ANGIO HEAD NECK W WO CM Result Date: 08/29/2023 EXAM: CT HEAD WITHOUT CTA HEAD AND NECK WITH AND WITHOUT 08/29/2023 06:31:52 PM TECHNIQUE: CTA of the head and neck was performed with and without the administration of intravenous contrast. Noncontrast CT of the head with reconstructed 2-D images are also provided for review. Multiplanar 2D and/or 3D reformatted images are provided for  review. Automated exposure control, iterative reconstruction, and/or weight based adjustment of the mA/kV was utilized to reduce the radiation dose to as low as reasonably achievable. COMPARISON: MRI of the head without contrast 08/29/2023 CLINICAL HISTORY: Neuro deficit, acute, stroke suspected; concern for right PCA  infarct. Notes from triage: Patient to ED by EMS from [facility] for AMS. Per EMS he has been having frequent urination, confusion and hallucinations for a few days. FINDINGS: CT HEAD: BRAIN AND VENTRICLES: The non-contrast head CT demonstrates an acute/subacute non-hemorrhagic infarct in the posterior inferior right temporal lobe and inferior right occipital lobe. The area of acute infarction in the left splenium of the corpus callosum is not appreciated by non-contrast head CT. Moderate confluent periventricular white matter hypoattenuation is again noted bilaterally. ORBITS: The visualized portion of the orbits demonstrate no acute abnormality. SINUSES: The visualized paranasal sinuses and mastoid air cells demonstrate no acute abnormality. SOFT TISSUES AND SKULL: No acute abnormality of the visualized skull or soft tissues. CTA NECK: AORTIC ARCH AND ARCH VESSELS: The aortic arch is incompletely imaged. Atherosclerotic changes are present in the left subclavian artery without focal stenosis. CERVICAL CAROTID ARTERIES: Atherosclerotic changes are present at the bifurcation of proximal right ICA without significant stenosis. Mild tortuosity is present in the distal cervical right ICA without significant stenosis. Scattered faint calcifications are present in the distal left common carotid artery without significant stenosis. Atherosclerotic calcifications at the left carotid artery are more prominent than on the right. No significant stenosis is present relative to the more distal vessel. Mild tortuosity of the cervical left ICA is present just below the bifurcation without significant stenosis. CERVICAL VERTEBRAL ARTERIES: The left vertebral artery is the dominant vessel. Atherosclerotic calcifications are present at the dural margin of the right vertebral artery. A 50% stenosis is present. SOFT TISSUES: The lung apices are clear. No cervical or superior mediastinal lymphadenopathy. The larynx  and pharynx are unremarkable. No acute abnormality of the salivary and thyroid  glands. BONES: Multilevel degenerative changes are present in the cervical spine. Straightening of the normal cervical lordosis is present. Moderate-to-severe foraminal stenosis is present bilaterally at C4-5 and on the right at C3-4. No focal osseous lesions are present. CTA HEAD: ANTERIOR CIRCULATION: Atherosclerotic calcifications are present within the cavernous internal carotid arteries bilaterally without significant stenosis through the ICA termini. Mild Narrowing is present in the nondominant right A1 segment. Scattered distal segmental narrowing is present within the ACA and MCA branch vessels consistent with intracranial atherosclerotic disease. No significant proximal stenosis or occlusion is present in the ACA or MCA branch vessels. No aneurysm. POSTERIOR CIRCULATION: The posterior cerebral arteries originate from the basilar tip. Moderate stenoses are present in the P2 segments bilaterally, left greater than right. No aneurysm. OTHER: No dural venous sinus thrombosis on this non-dedicated study. IMPRESSION: 1. Acute/subacute non-hemorrhagic infarct in the posterior inferior right temporal lobe and inferior right occipital lobe. 2. Moderate stenoses in the P2 segments of the posterior cerebral arteries bilaterally, left greater than right. 3. Atherosclerotic changes at the carotid bifurcations bilaterally without significant stenosis. 4. Tortuosity of the distal cervical internal carotid arteries bilaterally without focal stenosis. This is nonspecific, but suggests chronic hypertension. 5. Atherosclerotic calcifications at the dural margin of the right vertebral artery with 50% stenosis. Electronically signed by: Audree Leas MD 08/29/2023 07:47 PM EDT RP Workstation: [id number]   MR BRAIN WO CONTRAST Result Date: 08/29/2023 CLINICAL DATA:  Neuro deficit, acute, stroke suspected. Left sided hemianopsia, visual  hallucinations. EXAM: MRI  HEAD WITHOUT CONTRAST TECHNIQUE: Multiplanar, multiecho pulse sequences of the brain and surrounding structures were obtained without intravenous contrast. COMPARISON:  None Available. FINDINGS: Brain: There is a large acute to early subacute right PCA infarct involving the inferior right occipital and posterior temporal lobes. There is also a 1 cm subacute infarct involving the left lateral aspect of the splenium of the corpus callosum. There is well-defined cytotoxic edema associated with the right PCA infarct without significant mass effect. No intracranial hemorrhage, midline shift, hydrocephalus, or extra-axial fluid collection is identified. A small chronic infarct is noted in the posterolateral left temporal lobe, and there is also a tiny chronic cortical infarct in the left precentral gyrus. Patchy to confluent T2 hyperintensities elsewhere in cerebral white matter bilaterally are nonspecific but compatible with moderate to severe chronic small vessel ischemic disease. There are small chronic bilateral cerebellar infarcts. There is moderate cerebral atrophy. Vascular: Major intracranial vascular flow voids are preserved. Skull and upper cervical spine: Unremarkable bone marrow signal. Sinuses/Orbits: Bilateral cataract extraction. Minimal mucosal thickening in the left maxillary sinus. Clear mastoid air cells. Other: None. These results were called by telephone at the time of interpretation on 08/29/2023 at 3:41 pm to Dr. Jerald Molly , who verbally acknowledged these results. IMPRESSION: 1. Large acute to early subacute right PCA infarct. 2. Small subacute infarct in the splenium of the corpus callosum on the left. 3. Moderate to severe chronic small vessel ischemic disease. 4. Chronic left temporal infarct. Electronically Signed   By: Aundra Lee M.D.   On: 08/29/2023 15:45    Assessment and Plan Assessment & Plan   Acute right PCA and left corpus callosum  infarct Patient seems pleasant and comfortable Denies headache, nausea, vomiting, double vision. Patient reports problems with peripheral vision -daughter states this is going on for last 3 months  Strength intact in upper and lower extremities Continue with Eliquis  Continue with atorvastatin Continue with the PT, OT  Hypothyroidism Continue with the levothyroxine  75 mcg  Hypertension Continue with amlodipine  5 mg  Hyperlipidemia Continue with Lipitor 40 mg  Constipation Continue with the MiraLAX  GERD Continue with omeprazole 20 mg daily  A-fib Continue with Eliquis  Rate controlled   30 min Total time spent for obtaining history,  performing a medically appropriate examination and evaluation, reviewing the tests,   documenting clinical information in the electronic or other health record,  ,care coordination (not separately reported)

## 2023-09-08 DIAGNOSIS — I10 Essential (primary) hypertension: Secondary | ICD-10-CM | POA: Diagnosis not present

## 2023-09-08 DIAGNOSIS — E039 Hypothyroidism, unspecified: Secondary | ICD-10-CM | POA: Diagnosis not present

## 2023-09-08 DIAGNOSIS — I4891 Unspecified atrial fibrillation: Secondary | ICD-10-CM | POA: Diagnosis not present

## 2023-09-08 DIAGNOSIS — E782 Mixed hyperlipidemia: Secondary | ICD-10-CM | POA: Diagnosis not present

## 2023-09-08 DIAGNOSIS — H409 Unspecified glaucoma: Secondary | ICD-10-CM | POA: Diagnosis not present

## 2023-09-17 ENCOUNTER — Non-Acute Institutional Stay (SKILLED_NURSING_FACILITY): Payer: Self-pay | Admitting: Nurse Practitioner

## 2023-09-17 ENCOUNTER — Encounter: Payer: Self-pay | Admitting: Nurse Practitioner

## 2023-09-17 DIAGNOSIS — H409 Unspecified glaucoma: Secondary | ICD-10-CM

## 2023-09-17 DIAGNOSIS — E785 Hyperlipidemia, unspecified: Secondary | ICD-10-CM

## 2023-09-17 DIAGNOSIS — E039 Hypothyroidism, unspecified: Secondary | ICD-10-CM

## 2023-09-17 DIAGNOSIS — I10 Essential (primary) hypertension: Secondary | ICD-10-CM | POA: Diagnosis not present

## 2023-09-17 DIAGNOSIS — L409 Psoriasis, unspecified: Secondary | ICD-10-CM | POA: Diagnosis not present

## 2023-09-17 DIAGNOSIS — K5901 Slow transit constipation: Secondary | ICD-10-CM | POA: Diagnosis not present

## 2023-09-17 DIAGNOSIS — I483 Typical atrial flutter: Secondary | ICD-10-CM

## 2023-09-17 DIAGNOSIS — I639 Cerebral infarction, unspecified: Secondary | ICD-10-CM

## 2023-09-17 NOTE — Progress Notes (Unsigned)
 Location:   S[facility] Room Number: 23 Place of Service:  SNF (31)  Provider: Kerman Peck NP  PCP: Rae Bugler, MD Patient Care Team: Rae Bugler, MD as PCP - General (Family Medicine) Sonny Dust, MD (Inactive) as PCP - Cardiology (Cardiology)  Extended Emergency Contact Information Primary Emergency Contact: Weismann,LISA Home Phone: [phone] Relation: Daughter Secondary Emergency Contact: Willetts,Teresa  [place]: [phone] Relation: Daughter  Code Status: DNR Goals of care:  Advanced Directive information    08/29/2023   12:34 PM  Advanced Directives  Does Patient Have a Medical Advance Directive? No  Would patient like information on creating a medical advance directive? No - Patient declined     No Known Allergies  Chief Complaint  Patient presents with  . Acute Visit    discharge    HPI:  89 y.o. male  with medical history significant of strokes, HTN, Afib on Eliquis , HLD, Psoriasis, Hypothyroidism, GERD, and Glaucoma was admitted to [facility] for therapy following hospital stay.     Hospitalized 08/29/23-09/01/23 for strokes. Presented with visual hallucination/disturbance, mostly saw animals, other strange things like shapes around house, no auditory hallucinations, ?confusion. CTA/MRI 1. Large acute to early subacute right PCA infarct. 2. Small subacute infarct in the splenium of the corpus callosum on the left. 3. Moderate to severe chronic small vessel ischemic disease. 4. Chronic left temporal infarct. On Eliquis , placed on Atorvastatin . HPOA declined continuation of Zyprexa .          The patient has regained physical strength, ADL function, he is stable to transfer to [facility] for continue therapy and care.              HLD LDL 111 08/30/23, on Atorvastatin              HTN, controlled, on Amlodipine . Bun/creat 18/1.12 09/01/23             PAF, on Eliquis , off rate control meds.              Psoriasis on  Enbrel, followed by Dermatology             Hypothyroidism, on Levothyroxine , TSH 2.771 08/30/23             Glaucoma, eye drops             Constipation, MiraLax , Dulcolax available to him.              GERD, taking Omeprazole                   Past Medical History:  Diagnosis Date  . Glaucoma   . Hypertension   . Psoriasis     Past Surgical History:  Procedure Laterality Date  . GLAUCOMA SURGERY Left   . HERNIA REPAIR    . HYDROCELE EXCISION / REPAIR        reports that he has quit smoking. His smoking use included pipe. He has never been exposed to tobacco smoke. He has never used smokeless tobacco. He reports current alcohol use of about 7.0 standard drinks of alcohol per week. He reports that he does not use drugs. Social History   Socioeconomic History  . Marital status: Married    Spouse name: Not on file  . Number of children: Not on file  . Years of education: Not on file  . Highest education level: Not on file  Occupational History  . Not on file  Tobacco Use  .  Smoking status: Former    Types: Pipe    Passive exposure: Never  . Smokeless tobacco: Never  . Tobacco comments:    quit >40 years ago  Vaping Use  . Vaping status: Never Used  Substance and Sexual Activity  . Alcohol use: Yes    Alcohol/week: 7.0 standard drinks of alcohol    Types: 7 Standard drinks or equivalent per week    Comment: 1 drink daily  . Drug use: Never  . Sexual activity: Not Currently  Other Topics Concern  . Not on file  Social History Narrative   Wife has alzheimer's   Social Drivers of Health   Financial Resource Strain: Not on file  Food Insecurity: No Food Insecurity (08/30/2023)   Hunger Vital Sign   . Worried About Programme researcher, broadcasting/film/video in the Last Year: Never true   . Ran Out of Food in the Last Year: Never true  Transportation Needs: No Transportation Needs (08/30/2023)   PRAPARE - Transportation   . Lack of Transportation (Medical): No   . Lack of Transportation  (Non-Medical): No  Physical Activity: Not on file  Stress: Not on file  Social Connections: Moderately Integrated (08/30/2023)   Social Connection and Isolation Panel [NHANES]   . Frequency of Communication with Friends and Family: Three times a week   . Frequency of Social Gatherings with Friends and Family: Three times a week   . Attends Religious Services: 1 to 4 times per year   . Active Member of Clubs or Organizations: No   . Attends Banker Meetings: 1 to 4 times per year   . Marital Status: Widowed  Intimate Partner Violence: Not At Risk (08/30/2023)   Humiliation, Afraid, Rape, and Kick questionnaire   . Fear of Current or Ex-Partner: No   . Emotionally Abused: No   . Physically Abused: No   . Sexually Abused: No   Functional Status Survey:    No Known Allergies  Pertinent  Health Maintenance Due  Topic Date Due  . INFLUENZA VACCINE  11/09/2023    Medications: Allergies as of 09/17/2023   No Known Allergies      Medication List        Accurate as of September 17, 2023 11:59 PM. If you have any questions, ask your nurse or doctor.          amLODipine  5 MG tablet Commonly known as: NORVASC  Take 5 mg by mouth daily.   apixaban  5 MG Tabs tablet Commonly known as: Eliquis  Take 1 tablet (5 mg total) by mouth 2 (two) times daily. NEEDS LABS FOR ELIQUIS  REFILLS, COME TO OFFICE.  THANK YOU What changed: additional instructions   atorvastatin  40 MG tablet Commonly known as: LIPITOR Take 1 tablet (40 mg total) by mouth at bedtime.   bimatoprost 0.01 % Soln Commonly known as: LUMIGAN Place 1 drop into the right eye at bedtime.   bisacodyl  10 MG suppository Commonly known as: DULCOLAX Place 1 suppository (10 mg total) rectally daily as needed for moderate constipation.   Combigan  0.2-0.5 % ophthalmic solution Generic drug: brimonidine -timolol  Place 1 drop into both eyes in the morning and at bedtime.   cyanocobalamin  1000 MCG tablet Commonly known  as: VITAMIN B12 Take 1,000 mcg by mouth daily.   dorzolamide  2 % ophthalmic solution Commonly known as: TRUSOPT  Place 1 drop into both eyes in the morning and at bedtime.   levothyroxine  75 MCG tablet Commonly known as: SYNTHROID  Take 75 mcg by mouth  See admin instructions. Take 75 mcg by mouth five hours after taking Omeprazole   Metamucil 4 in 1 Fiber 55.6 % Powd Generic drug: Psyllium Take 3.4 g by mouth See admin instructions. Mix 3.4 grams into a glass of water and drink by mouth at bedtime   MULTIVITAMIN ADULT PO Take 1 tablet by mouth daily with breakfast.   omeprazole 40 MG capsule Commonly known as: PRILOSEC Take 40 mg by mouth daily before breakfast.        Review of Systems  Constitutional:  Negative for appetite change, fatigue and fever.  HENT:  Negative for congestion and trouble swallowing.   Eyes:  Negative for visual disturbance.  Respiratory:  Negative for cough, shortness of breath and wheezing.   Cardiovascular:  Positive for leg swelling.  Gastrointestinal:  Negative for abdominal pain and constipation.  Genitourinary:  Negative for dysuria and urgency.  Musculoskeletal:  Positive for arthralgias and gait problem.  Skin:  Negative for color change.  Neurological:  Negative for weakness and light-headedness.  Psychiatric/Behavioral:  Negative for behavioral problems, confusion, hallucinations and sleep disturbance.     Vitals:   09/17/23 1653  BP: 125/80  Pulse: 76  Resp: 17  Temp: 97.6 F (36.4 C)  SpO2: 94%  Weight: 164 lb (74.4 kg)   Body mass index is 21.64 kg/m. Physical Exam Vitals and nursing note reviewed.  Constitutional:      Appearance: Normal appearance.  HENT:     Head: Normocephalic and atraumatic.     Nose: Nose normal.     Mouth/Throat:     Mouth: Mucous membranes are moist.  Eyes:     Extraocular Movements: Extraocular movements intact.     Conjunctiva/sclera: Conjunctivae normal.     Comments: Right pupil fixed,  small.   Cardiovascular:     Rate and Rhythm: Normal rate and regular rhythm.     Heart sounds: No murmur heard. Pulmonary:     Effort: Pulmonary effort is normal.     Breath sounds: No rales.  Abdominal:     General: Bowel sounds are normal.     Palpations: Abdomen is soft.     Tenderness: There is no abdominal tenderness.  Musculoskeletal:        General: No tenderness. Normal range of motion.     Cervical back: Normal range of motion and neck supple.     Right lower leg: Edema present.     Left lower leg: Edema present.     Comments: Trace edema BLE  Skin:    General: Skin is warm and dry.  Neurological:     General: No focal deficit present.     Mental Status: He is alert and oriented to person, place, and time. Mental status is at baseline.     Coordination: Coordination normal.     Gait: Gait abnormal.     Comments: Mild right facial weakness.   Psychiatric:        Mood and Affect: Mood normal.        Behavior: Behavior normal.        Thought Content: Thought content normal.        Judgment: Judgment normal.    Labs reviewed: Basic Metabolic Panel: Recent Labs    08/30/23 0500 08/31/23 0633 09/01/23 0742  NA 133* 139 139  K 3.1* 3.4* 4.0  CL 101 105 107  CO2 25 26 23   GLUCOSE 95 95 95  BUN 16 18 18   CREATININE 0.85 1.04 1.12  CALCIUM   8.6* 8.9 9.2  MG  --   --  2.1   Liver Function Tests: Recent Labs    08/29/23 1240  AST 20  ALT 9  ALKPHOS 53  BILITOT 1.1  PROT 6.3*  ALBUMIN 3.3*   No results for input(s): "LIPASE", "AMYLASE" in the last 8760 hours. No results for input(s): "AMMONIA" in the last 8760 hours. CBC: Recent Labs    08/29/23 1240 08/30/23 0500 09/01/23 0742  WBC 7.3 7.4 7.4  HGB 13.5 13.4 14.5  HCT 40.6 40.0 45.6  MCV 95.5 93.7 98.7  PLT 180 180 175   Cardiac Enzymes: No results for input(s): "CKTOTAL", "CKMB", "CKMBINDEX", "TROPONINI" in the last 8760 hours. BNP: Invalid input(s): "POCBNP" CBG: Recent Labs     08/29/23 1233  GLUCAP 101*    Procedures and Imaging Studies During Stay: ECHOCARDIOGRAM COMPLETE Result Date: 08/30/2023    ECHOCARDIOGRAM REPORT   Patient Name:   COLEBY YETT Date of Exam: 08/30/2023 Medical Rec #:  [id number]       Height:       73.0 in Accession #:    [id number]      Weight:       165.3 lb Date of Birth:  1929-11-30       BSA:          1.984 m Patient Age:    89 years        BP:           140/72 mmHg Patient Gender: M               HR:           57 bpm. Exam Location:  Inpatient Procedure: 2D Echo, Cardiac Doppler and Color Doppler (Both Spectral and Color            Flow Doppler were utilized during procedure). Indications:    Stroke  History:        Patient has prior history of Echocardiogram examinations.                 Arrythmias:Atrial Flutter; Risk Factors:Hypertension and Former                 Smoker.  Sonographer:    Willey Harrier Referring Phys: [id number] BINAYA DAHAL IMPRESSIONS  1. Left ventricular ejection fraction, by estimation, is 55 to 60%. The left ventricle has normal function. The left ventricle has no regional wall motion abnormalities. Left ventricular diastolic function could not be evaluated.  2. Right ventricular systolic function is normal. The right ventricular size is normal.  3. The mitral valve is normal in structure. No evidence of mitral valve regurgitation. No evidence of mitral stenosis.  4. The aortic valve is normal in structure. Aortic valve regurgitation is not visualized. No aortic stenosis is present.  5. Aortic dilatation noted. There is borderline dilatation of the ascending aorta, measuring 39 mm.  6. The inferior vena cava is normal in size with greater than 50% respiratory variability, suggesting right atrial pressure of 3 mmHg. FINDINGS  Left Ventricle: Left ventricular ejection fraction, by estimation, is 55 to 60%. The left ventricle has normal function. The left ventricle has no regional wall motion abnormalities. The left ventricular  internal cavity size was normal in size. There is  no left ventricular hypertrophy. Left ventricular diastolic function could not be evaluated due to atrial fibrillation. Left ventricular diastolic function could not be evaluated. Right Ventricle: The right ventricular size is normal. No increase in right  ventricular wall thickness. Right ventricular systolic function is normal. Left Atrium: Left atrial size was normal in size. Right Atrium: Right atrial size was normal in size. Pericardium: There is no evidence of pericardial effusion. Mitral Valve: The mitral valve is normal in structure. No evidence of mitral valve regurgitation. No evidence of mitral valve stenosis. MV peak gradient, 3.9 mmHg. The mean mitral valve gradient is 1.0 mmHg. Tricuspid Valve: The tricuspid valve is normal in structure. Tricuspid valve regurgitation is not demonstrated. No evidence of tricuspid stenosis. Aortic Valve: The aortic valve is normal in structure. Aortic valve regurgitation is not visualized. No aortic stenosis is present. Aortic valve peak gradient measures 4.7 mmHg. Pulmonic Valve: The pulmonic valve was normal in structure. Pulmonic valve regurgitation is not visualized. No evidence of pulmonic stenosis. Aorta: Aortic dilatation noted. There is borderline dilatation of the ascending aorta, measuring 39 mm. Venous: The inferior vena cava is normal in size with greater than 50% respiratory variability, suggesting right atrial pressure of 3 mmHg. IAS/Shunts: No atrial level shunt detected by color flow Doppler.  LEFT VENTRICLE PLAX 2D LVIDd:         4.90 cm   Diastology LVIDs:         3.30 cm   LV e' medial:    5.66 cm/s LV PW:         1.10 cm   LV E/e' medial:  11.1 LV IVS:        1.10 cm   LV e' lateral:   5.66 cm/s LVOT diam:     2.10 cm   LV E/e' lateral: 11.1 LV SV:         66 LV SV Index:   33 LVOT Area:     3.46 cm  RIGHT VENTRICLE          IVC RV Basal diam:  3.90 cm  IVC diam: 1.80 cm LEFT ATRIUM             Index         RIGHT ATRIUM           Index LA Vol (A2C):   60.0 ml 30.23 ml/m  RA Area:     19.20 cm LA Vol (A4C):   45.1 ml 22.73 ml/m  RA Volume:   49.70 ml  25.04 ml/m LA Biplane Vol: 52.5 ml 26.46 ml/m  AORTIC VALVE AV Area (Vmax): 2.66 cm AV Vmax:        108.00 cm/s AV Peak Grad:   4.7 mmHg LVOT Vmax:      82.90 cm/s LVOT Vmean:     55.400 cm/s LVOT VTI:       0.191 m  AORTA Ao Root diam: 3.50 cm Ao Asc diam:  3.90 cm MITRAL VALVE               TRICUSPID VALVE MV Area (PHT): 2.38 cm    TR Peak grad:   29.2 mmHg MV Area VTI:   2.68 cm    TR Vmax:        270.00 cm/s MV Peak grad:  3.9 mmHg MV Mean grad:  1.0 mmHg    SHUNTS MV Vmax:       0.99 m/s    Systemic VTI:  0.19 m MV Vmean:      51.1 cm/s   Systemic Diam: 2.10 cm MV Decel Time: 319 msec MV E velocity: 62.60 cm/s MV A velocity: 81.40 cm/s MV E/A ratio:  0.77 Aditya Sabharwal Electronically signed by Zenda Highman  Sabharwal Signature Date/Time: 08/30/2023/3:05:15 PM    Final    CT ANGIO HEAD NECK W WO CM Result Date: 08/29/2023 EXAM: CT HEAD WITHOUT CTA HEAD AND NECK WITH AND WITHOUT 08/29/2023 06:31:52 PM TECHNIQUE: CTA of the head and neck was performed with and without the administration of intravenous contrast. Noncontrast CT of the head with reconstructed 2-D images are also provided for review. Multiplanar 2D and/or 3D reformatted images are provided for review. Automated exposure control, iterative reconstruction, and/or weight based adjustment of the mA/kV was utilized to reduce the radiation dose to as low as reasonably achievable. COMPARISON: MRI of the head without contrast 08/29/2023 CLINICAL HISTORY: Neuro deficit, acute, stroke suspected; concern for right PCA infarct. Notes from triage: Patient to ED by EMS [facility] for A[facility]. Per EMS he has been having frequent urination, confusion and hallucinations for a few days. FINDINGS: CT HEAD: BRAIN AND VENTRICLES: The non-contrast head CT demonstrates an acute/subacute  non-hemorrhagic infarct in the posterior inferior right temporal lobe and inferior right occipital lobe. The area of acute infarction in the left splenium of the corpus callosum is not appreciated by non-contrast head CT. Moderate confluent periventricular white matter hypoattenuation is again noted bilaterally. ORBITS: The visualized portion of the orbits demonstrate no acute abnormality. SINUSES: The visualized paranasal sinuses and mastoid air cells demonstrate no acute abnormality. SOFT TISSUES AND SKULL: No acute abnormality of the visualized skull or soft tissues. CTA NECK: AORTIC ARCH AND ARCH VESSELS: The aortic arch is incompletely imaged. Atherosclerotic changes are present in the left subclavian artery without focal stenosis. CERVICAL CAROTID ARTERIES: Atherosclerotic changes are present at the bifurcation of proximal right ICA without significant stenosis. Mild tortuosity is present in the distal cervical right ICA without significant stenosis. Scattered faint calcifications are present in the distal left common carotid artery without significant stenosis. Atherosclerotic calcifications at the left carotid artery are more prominent than on the right. No significant stenosis is present relative to the more distal vessel. Mild tortuosity of the cervical left ICA is present just below the bifurcation without significant stenosis. CERVICAL VERTEBRAL ARTERIES: The left vertebral artery is the dominant vessel. Atherosclerotic calcifications are present at the dural margin of the right vertebral artery. A 50% stenosis is present. SOFT TISSUES: The lung apices are clear. No cervical or superior mediastinal lymphadenopathy. The larynx and pharynx are unremarkable. No acute abnormality of the salivary and thyroid  glands. BONES: Multilevel degenerative changes are present in the cervical spine. Straightening of the normal cervical lordosis is present. Moderate-to-severe foraminal stenosis is present bilaterally at  C4-5 and on the right at C3-4. No focal osseous lesions are present. CTA HEAD: ANTERIOR CIRCULATION: Atherosclerotic calcifications are present within the cavernous internal carotid arteries bilaterally without significant stenosis through the ICA termini. Mild Narrowing is present in the nondominant right A1 segment. Scattered distal segmental narrowing is present within the ACA and MCA branch vessels consistent with intracranial atherosclerotic disease. No significant proximal stenosis or occlusion is present in the ACA or MCA branch vessels. No aneurysm. POSTERIOR CIRCULATION: The posterior cerebral arteries originate from the basilar tip. Moderate stenoses are present in the P2 segments bilaterally, left greater than right. No aneurysm. OTHER: No dural venous sinus thrombosis on this non-dedicated study. IMPRESSION: 1. Acute/subacute non-hemorrhagic infarct in the posterior inferior right temporal lobe and inferior right occipital lobe. 2. Moderate stenoses in the P2 segments of the posterior cerebral arteries bilaterally, left greater than right. 3. Atherosclerotic changes at the carotid bifurcations bilaterally  without significant stenosis. 4. Tortuosity of the distal cervical internal carotid arteries bilaterally without focal stenosis. This is nonspecific, but suggests chronic hypertension. 5. Atherosclerotic calcifications at the dural margin of the right vertebral artery with 50% stenosis. Electronically signed by: Audree Leas MD 08/29/2023 07:47 PM EDT RP Workstation: [id number]   MR BRAIN WO CONTRAST Result Date: 08/29/2023 CLINICAL DATA:  Neuro deficit, acute, stroke suspected. Left sided hemianopsia, visual hallucinations. EXAM: MRI HEAD WITHOUT CONTRAST TECHNIQUE: Multiplanar, multiecho pulse sequences of the brain and surrounding structures were obtained without intravenous contrast. COMPARISON:  None Available. FINDINGS: Brain: There is a large acute to early subacute right PCA infarct  involving the inferior right occipital and posterior temporal lobes. There is also a 1 cm subacute infarct involving the left lateral aspect of the splenium of the corpus callosum. There is well-defined cytotoxic edema associated with the right PCA infarct without significant mass effect. No intracranial hemorrhage, midline shift, hydrocephalus, or extra-axial fluid collection is identified. A small chronic infarct is noted in the posterolateral left temporal lobe, and there is also a tiny chronic cortical infarct in the left precentral gyrus. Patchy to confluent T2 hyperintensities elsewhere in cerebral white matter bilaterally are nonspecific but compatible with moderate to severe chronic small vessel ischemic disease. There are small chronic bilateral cerebellar infarcts. There is moderate cerebral atrophy. Vascular: Major intracranial vascular flow voids are preserved. Skull and upper cervical spine: Unremarkable bone marrow signal. Sinuses/Orbits: Bilateral cataract extraction. Minimal mucosal thickening in the left maxillary sinus. Clear mastoid air cells. Other: None. These results were called by telephone at the time of interpretation on 08/29/2023 at 3:41 pm to Dr. Jerald Molly , who verbally acknowledged these results. IMPRESSION: 1. Large acute to early subacute right PCA infarct. 2. Small subacute infarct in the splenium of the corpus callosum on the left. 3. Moderate to severe chronic small vessel ischemic disease. 4. Chronic left temporal infarct. Electronically Signed   By: Aundra Lee M.D.   On: 08/29/2023 15:45    Assessment/Plan:   Acute stroke due to ischemia [facility])  Presented to ED with visual hallucination/disturbance, mostly saw animals, other strange things like shapes around house, no auditory hallucinations, ?confusion. CTA/MRI 1. Large acute to early subacute right PCA infarct. 2. Small subacute infarct in the splenium of the corpus callosum on the left. 3. Moderate to severe  chronic small vessel ischemic disease. 4. Chronic left temporal infarct. On Eliquis , placed on Atorvastatin .  HPOA declined continuation of Zyprexa .       No recurrence of visual hallucination during SN[facility] stay.     HLD (hyperlipidemia) LDL 111 08/30/23, on Atorvastatin   HTN (hypertension) Blood pressure is controlled, on Amlodipine .   Atrial flutter (HCC) Heart rate is in control, on Eliquis , off rate control meds.   Psoriasis  on Enbrel, followed by Dermatology  Hypothyroidism on Levothyroxine , TSH 2.771 08/30/23  Glaucoma eye drops  Slow transit constipation Stable, MiraLax , Dulcolax available to him.    Patient is being discharged with the following home health services:    Patient is being discharged with the following durable medical equipment:    Patient has been advised to f/u with their PCP in 1-2 weeks to bring them up to date on their rehab stay.  Social services at facility was responsible for arranging this appointment.  Pt was provided with a 30 day supply of prescriptions for medications and refills must be obtained from their PCP.  For controlled substances, a more limited supply may be  provided adequate until PCP appointment only.  Future labs/tests needed:  none

## 2023-09-18 NOTE — Assessment & Plan Note (Signed)
 Presented to ED with visual hallucination/disturbance, mostly saw animals, other strange things like shapes around house, no auditory hallucinations, ?confusion. CTA/MRI 1. Large acute to early subacute right PCA infarct. 2. Small subacute infarct in the splenium of the corpus callosum on the left. 3. Moderate to severe chronic small vessel ischemic disease. 4. Chronic left temporal infarct. On Eliquis , placed on Atorvastatin .  HPOA declined continuation of Zyprexa .       No recurrence of visual hallucination during SNF [facility] stay.

## 2023-09-18 NOTE — Assessment & Plan Note (Signed)
eyedrops

## 2023-09-18 NOTE — Assessment & Plan Note (Signed)
 on Levothyroxine , TSH 2.771 08/30/23

## 2023-09-18 NOTE — Assessment & Plan Note (Signed)
 Heart rate is in control, on Eliquis , off rate control meds.

## 2023-09-18 NOTE — Assessment & Plan Note (Signed)
 LDL 111 08/30/23, on Atorvastatin

## 2023-09-18 NOTE — Assessment & Plan Note (Signed)
 Stable, MiraLax , Dulcolax available to him.

## 2023-09-18 NOTE — Assessment & Plan Note (Signed)
Blood pressure is controlled,  on Amlodipine.

## 2023-09-18 NOTE — Assessment & Plan Note (Signed)
 on Enbrel, followed by Dermatology

## 2023-09-18 NOTE — Assessment & Plan Note (Signed)
Stable, taking Omeprazole

## 2023-09-27 DIAGNOSIS — R278 Other lack of coordination: Secondary | ICD-10-CM | POA: Diagnosis not present

## 2023-09-27 DIAGNOSIS — M6281 Muscle weakness (generalized): Secondary | ICD-10-CM | POA: Diagnosis not present

## 2023-09-27 DIAGNOSIS — R2681 Unsteadiness on feet: Secondary | ICD-10-CM | POA: Diagnosis not present

## 2023-09-27 DIAGNOSIS — I69391 Dysphagia following cerebral infarction: Secondary | ICD-10-CM | POA: Diagnosis not present

## 2023-09-27 DIAGNOSIS — I69320 Aphasia following cerebral infarction: Secondary | ICD-10-CM | POA: Diagnosis not present

## 2023-10-01 ENCOUNTER — Encounter: Payer: Self-pay | Admitting: Nurse Practitioner

## 2023-10-01 DIAGNOSIS — I69320 Aphasia following cerebral infarction: Secondary | ICD-10-CM | POA: Diagnosis not present

## 2023-10-01 DIAGNOSIS — I69391 Dysphagia following cerebral infarction: Secondary | ICD-10-CM | POA: Diagnosis not present

## 2023-10-01 DIAGNOSIS — R2681 Unsteadiness on feet: Secondary | ICD-10-CM | POA: Diagnosis not present

## 2023-10-01 DIAGNOSIS — M6281 Muscle weakness (generalized): Secondary | ICD-10-CM | POA: Diagnosis not present

## 2023-10-01 DIAGNOSIS — R278 Other lack of coordination: Secondary | ICD-10-CM | POA: Diagnosis not present

## 2023-10-01 NOTE — Progress Notes (Signed)
 Location:   [facility]  Nursing Home Room Number: 907-A Place of Service:  ALF [phone]) Provider:  Dr.Esteen Delpriore  PCP: Seabron Lenis, MD  Patient Care Team: Seabron Lenis, MD as PCP - General (Family Medicine) Hobart Powell BRAVO, MD (Inactive) as PCP - Cardiology (Cardiology)  Extended Emergency Contact Information Primary Emergency Contact: Kirby,LISA Home Phone: [phone] Relation: Daughter Secondary Emergency Contact: Willetts,Teresa  [place]: [phone] Relation: Daughter  Code Status:  FULL CODE Goals of care: Advanced Directive information    10/01/2023    9:53 AM  Advanced Directives  Does Patient Have a Medical Advance Directive? No  Would patient like information on creating a medical advance directive? No - Patient declined     Chief Complaint  Patient presents with   Medical Management of Chronic Issues    Routine Visit. Discuss the need for Covid Booster.    HPI:  Pt is a 89 y.o. male seen today for medical management of chronic diseases.     Past Medical History:  Diagnosis Date   Glaucoma    Hypertension    Psoriasis    Past Surgical History:  Procedure Laterality Date   GLAUCOMA SURGERY Left    HERNIA REPAIR     HYDROCELE EXCISION / REPAIR      No Known Allergies  Allergies as of 10/01/2023   No Known Allergies      Medication List        Accurate as of October 01, 2023  9:53 AM. If you have any questions, ask your nurse or doctor.          amLODipine  5 MG tablet Commonly known as: NORVASC  Take 5 mg by mouth daily.   apixaban  5 MG Tabs tablet Commonly known as: Eliquis  Take 1 tablet (5 mg total) by mouth 2 (two) times daily. NEEDS LABS FOR ELIQUIS  REFILLS, COME TO OFFICE.  THANK YOU   atorvastatin  40 MG tablet Commonly known as: LIPITOR Take 1 tablet (40 mg total) by mouth at bedtime.   bimatoprost 0.01 % Soln Commonly known as: LUMIGAN Place 1 drop into the right eye at  bedtime.   bisacodyl  10 MG suppository Commonly known as: DULCOLAX Place 1 suppository (10 mg total) rectally daily as needed for moderate constipation.   Combigan  0.2-0.5 % ophthalmic solution Generic drug: brimonidine -timolol  Place 1 drop into both eyes in the morning and at bedtime.   cyanocobalamin  1000 MCG tablet Commonly known as: VITAMIN B12 Take 1,000 mcg by mouth daily.   dorzolamide  2 % ophthalmic solution Commonly known as: TRUSOPT  Place 1 drop into both eyes in the morning and at bedtime.   levothyroxine  75 MCG tablet Commonly known as: SYNTHROID  Take 75 mcg by mouth every 5 (five) hours as needed. After taking Omeprazole   Metamucil 4 in 1 Fiber 55.6 % Powd Generic drug: Psyllium Take 3.4 g by mouth at bedtime.   MULTIVITAMIN ADULT PO Take 1 tablet by mouth daily with breakfast.   omeprazole 40 MG capsule Commonly known as: PRILOSEC Take 40 mg by mouth daily before breakfast.   polyethylene glycol 17 g packet Commonly known as: MIRALAX  / GLYCOLAX  Take 17 g by mouth daily.        Review of Systems  Immunization History  Administered Date(s) Administered   Fluad Quad(high Dose 65+) 12/16/2018   Moderna Sars-Covid-2 Vaccination 04/14/2019, 05/12/2019, 02/17/2020, 07/19/2020, 08/19/2021   PFIZER(Purple Top)SARS-COV-2 Vaccination 12/28/2020   PNEUMOCOCCAL CONJUGATE-20 05/09/2022   Pfizer(Comirnaty)Fall Seasonal Vaccine 12  years and older 01/05/2022, 12/15/2022   Pneumococcal Conjugate-13 09/22/2013   Pneumococcal Polysaccharide-23 07/12/2001   Respiratory Syncytial Virus Vaccine,Recomb Aduvanted(Arexvy) 04/21/2022   Td 11/16/2011, 05/06/2021   Zoster Recombinant(Shingrix) 03/09/2020, 09/09/2020   Pertinent  Health Maintenance Due  Topic Date Due   INFLUENZA VACCINE  11/09/2023      10/09/2018   12:25 PM 10/09/2018    8:00 PM 10/10/2018    8:00 AM 10/10/2018   10:15 PM 10/11/2018    8:37 AM  Fall Risk  (RETIRED) Patient Fall Risk Level Low fall risk   Moderate fall risk  Moderate fall risk  Moderate fall risk  Moderate fall risk      Data saved with a previous flowsheet row definition   Functional Status Survey:    Vitals:   10/01/23 0948  BP: 128/78  Pulse: 68  Resp: 18  Temp: 97.8 F (36.6 C)  SpO2: 98%  Weight: 161 lb 12.8 oz (73.4 kg)  Height: 6' 1 (1.854 m)   Body mass index is 21.35 kg/m. Physical Exam  Labs reviewed: Recent Labs    08/30/23 0500 08/31/23 0633 09/01/23 0742  NA 133* 139 139  K 3.1* 3.4* 4.0  CL 101 105 107  CO2 25 26 23   GLUCOSE 95 95 95  BUN 16 18 18   CREATININE 0.85 1.04 1.12  CALCIUM  8.6* 8.9 9.2  MG  --   --  2.1   Recent Labs    08/29/23 1240  AST 20  ALT 9  ALKPHOS 53  BILITOT 1.1  PROT 6.3*  ALBUMIN 3.3*   Recent Labs    08/29/23 1240 08/30/23 0500 09/01/23 0742  WBC 7.3 7.4 7.4  HGB 13.5 13.4 14.5  HCT 40.6 40.0 45.6  MCV 95.5 93.7 98.7  PLT 180 180 175   Lab Results  Component Value Date   TSH 2.771 08/30/2023   Lab Results  Component Value Date   HGBA1C 5.0 08/29/2023   Lab Results  Component Value Date   CHOL 173 08/30/2023   HDL 47 08/30/2023   LDLCALC 111 (H) 08/30/2023   TRIG 74 08/30/2023   CHOLHDL 3.7 08/30/2023    Significant Diagnostic Results in last 30 days:  No results found.  Assessment/Plan There are no diagnoses linked to this encounter.   Family/ staff Communication:   Labs/tests ordered:     This encounter was created in error - please disregard.

## 2023-10-02 DIAGNOSIS — M6281 Muscle weakness (generalized): Secondary | ICD-10-CM | POA: Diagnosis not present

## 2023-10-02 DIAGNOSIS — R2681 Unsteadiness on feet: Secondary | ICD-10-CM | POA: Diagnosis not present

## 2023-10-02 DIAGNOSIS — R278 Other lack of coordination: Secondary | ICD-10-CM | POA: Diagnosis not present

## 2023-10-02 DIAGNOSIS — I69320 Aphasia following cerebral infarction: Secondary | ICD-10-CM | POA: Diagnosis not present

## 2023-10-02 DIAGNOSIS — I69391 Dysphagia following cerebral infarction: Secondary | ICD-10-CM | POA: Diagnosis not present

## 2023-10-03 DIAGNOSIS — I69320 Aphasia following cerebral infarction: Secondary | ICD-10-CM | POA: Diagnosis not present

## 2023-10-03 DIAGNOSIS — R278 Other lack of coordination: Secondary | ICD-10-CM | POA: Diagnosis not present

## 2023-10-03 DIAGNOSIS — R2681 Unsteadiness on feet: Secondary | ICD-10-CM | POA: Diagnosis not present

## 2023-10-03 DIAGNOSIS — I69391 Dysphagia following cerebral infarction: Secondary | ICD-10-CM | POA: Diagnosis not present

## 2023-10-03 DIAGNOSIS — M6281 Muscle weakness (generalized): Secondary | ICD-10-CM | POA: Diagnosis not present

## 2023-10-04 DIAGNOSIS — I69320 Aphasia following cerebral infarction: Secondary | ICD-10-CM | POA: Diagnosis not present

## 2023-10-04 DIAGNOSIS — I69391 Dysphagia following cerebral infarction: Secondary | ICD-10-CM | POA: Diagnosis not present

## 2023-10-04 DIAGNOSIS — R278 Other lack of coordination: Secondary | ICD-10-CM | POA: Diagnosis not present

## 2023-10-04 DIAGNOSIS — M6281 Muscle weakness (generalized): Secondary | ICD-10-CM | POA: Diagnosis not present

## 2023-10-04 DIAGNOSIS — R2681 Unsteadiness on feet: Secondary | ICD-10-CM | POA: Diagnosis not present

## 2023-10-05 DIAGNOSIS — R2681 Unsteadiness on feet: Secondary | ICD-10-CM | POA: Diagnosis not present

## 2023-10-05 DIAGNOSIS — M6281 Muscle weakness (generalized): Secondary | ICD-10-CM | POA: Diagnosis not present

## 2023-10-05 DIAGNOSIS — R278 Other lack of coordination: Secondary | ICD-10-CM | POA: Diagnosis not present

## 2023-10-05 DIAGNOSIS — I69391 Dysphagia following cerebral infarction: Secondary | ICD-10-CM | POA: Diagnosis not present

## 2023-10-05 DIAGNOSIS — I69320 Aphasia following cerebral infarction: Secondary | ICD-10-CM | POA: Diagnosis not present

## 2023-10-08 DIAGNOSIS — I69320 Aphasia following cerebral infarction: Secondary | ICD-10-CM | POA: Diagnosis not present

## 2023-10-08 DIAGNOSIS — R2681 Unsteadiness on feet: Secondary | ICD-10-CM | POA: Diagnosis not present

## 2023-10-08 DIAGNOSIS — R278 Other lack of coordination: Secondary | ICD-10-CM | POA: Diagnosis not present

## 2023-10-08 DIAGNOSIS — M6281 Muscle weakness (generalized): Secondary | ICD-10-CM | POA: Diagnosis not present

## 2023-10-08 DIAGNOSIS — I69391 Dysphagia following cerebral infarction: Secondary | ICD-10-CM | POA: Diagnosis not present

## 2023-10-09 DIAGNOSIS — I69320 Aphasia following cerebral infarction: Secondary | ICD-10-CM | POA: Diagnosis not present

## 2023-10-09 DIAGNOSIS — I69391 Dysphagia following cerebral infarction: Secondary | ICD-10-CM | POA: Diagnosis not present

## 2023-10-09 DIAGNOSIS — R2681 Unsteadiness on feet: Secondary | ICD-10-CM | POA: Diagnosis not present

## 2023-10-09 DIAGNOSIS — M6281 Muscle weakness (generalized): Secondary | ICD-10-CM | POA: Diagnosis not present

## 2023-10-09 DIAGNOSIS — R278 Other lack of coordination: Secondary | ICD-10-CM | POA: Diagnosis not present

## 2023-10-10 DIAGNOSIS — M6281 Muscle weakness (generalized): Secondary | ICD-10-CM | POA: Diagnosis not present

## 2023-10-10 DIAGNOSIS — R278 Other lack of coordination: Secondary | ICD-10-CM | POA: Diagnosis not present

## 2023-10-10 DIAGNOSIS — I69391 Dysphagia following cerebral infarction: Secondary | ICD-10-CM | POA: Diagnosis not present

## 2023-10-10 DIAGNOSIS — I69320 Aphasia following cerebral infarction: Secondary | ICD-10-CM | POA: Diagnosis not present

## 2023-10-10 DIAGNOSIS — R2681 Unsteadiness on feet: Secondary | ICD-10-CM | POA: Diagnosis not present

## 2023-10-11 DIAGNOSIS — I69391 Dysphagia following cerebral infarction: Secondary | ICD-10-CM | POA: Diagnosis not present

## 2023-10-11 DIAGNOSIS — I69320 Aphasia following cerebral infarction: Secondary | ICD-10-CM | POA: Diagnosis not present

## 2023-10-11 DIAGNOSIS — R2681 Unsteadiness on feet: Secondary | ICD-10-CM | POA: Diagnosis not present

## 2023-10-11 DIAGNOSIS — M6281 Muscle weakness (generalized): Secondary | ICD-10-CM | POA: Diagnosis not present

## 2023-10-11 DIAGNOSIS — R278 Other lack of coordination: Secondary | ICD-10-CM | POA: Diagnosis not present

## 2023-10-12 DIAGNOSIS — I69391 Dysphagia following cerebral infarction: Secondary | ICD-10-CM | POA: Diagnosis not present

## 2023-10-12 DIAGNOSIS — M6281 Muscle weakness (generalized): Secondary | ICD-10-CM | POA: Diagnosis not present

## 2023-10-12 DIAGNOSIS — R278 Other lack of coordination: Secondary | ICD-10-CM | POA: Diagnosis not present

## 2023-10-12 DIAGNOSIS — I69320 Aphasia following cerebral infarction: Secondary | ICD-10-CM | POA: Diagnosis not present

## 2023-10-12 DIAGNOSIS — R2681 Unsteadiness on feet: Secondary | ICD-10-CM | POA: Diagnosis not present

## 2023-10-15 DIAGNOSIS — H903 Sensorineural hearing loss, bilateral: Secondary | ICD-10-CM | POA: Diagnosis not present

## 2023-10-15 DIAGNOSIS — M6281 Muscle weakness (generalized): Secondary | ICD-10-CM | POA: Diagnosis not present

## 2023-10-15 DIAGNOSIS — R278 Other lack of coordination: Secondary | ICD-10-CM | POA: Diagnosis not present

## 2023-10-15 DIAGNOSIS — I69391 Dysphagia following cerebral infarction: Secondary | ICD-10-CM | POA: Diagnosis not present

## 2023-10-15 DIAGNOSIS — R2681 Unsteadiness on feet: Secondary | ICD-10-CM | POA: Diagnosis not present

## 2023-10-15 DIAGNOSIS — I69320 Aphasia following cerebral infarction: Secondary | ICD-10-CM | POA: Diagnosis not present

## 2023-10-16 ENCOUNTER — Telehealth: Payer: Self-pay | Admitting: Physician Assistant

## 2023-10-16 DIAGNOSIS — R2681 Unsteadiness on feet: Secondary | ICD-10-CM | POA: Diagnosis not present

## 2023-10-16 DIAGNOSIS — I69391 Dysphagia following cerebral infarction: Secondary | ICD-10-CM | POA: Diagnosis not present

## 2023-10-16 DIAGNOSIS — I69320 Aphasia following cerebral infarction: Secondary | ICD-10-CM | POA: Diagnosis not present

## 2023-10-16 DIAGNOSIS — R278 Other lack of coordination: Secondary | ICD-10-CM | POA: Diagnosis not present

## 2023-10-16 DIAGNOSIS — M6281 Muscle weakness (generalized): Secondary | ICD-10-CM | POA: Diagnosis not present

## 2023-10-16 NOTE — Telephone Encounter (Signed)
 Dtr calling in to report pt w/ recent stroke and is in assisted living.  Says pt recently had orthostatic hypotension and calling to see if pt needs to be seen.  Advised to start w/ PCP first for evaluation and they would let us  know if a cardiology need is there.  Advised the ortho hypo most likely related to recent stroke. Dtr verbalized understanding and agreeable to plan.  Forwarding to last provider for FYI and if other advisement is recommended.

## 2023-10-16 NOTE — Telephone Encounter (Signed)
 Pts daughter requesting a c/b from the nurse she would like to ask a few questions. Please advise

## 2023-10-17 DIAGNOSIS — M6281 Muscle weakness (generalized): Secondary | ICD-10-CM | POA: Diagnosis not present

## 2023-10-17 DIAGNOSIS — R410 Disorientation, unspecified: Secondary | ICD-10-CM | POA: Diagnosis not present

## 2023-10-17 DIAGNOSIS — R278 Other lack of coordination: Secondary | ICD-10-CM | POA: Diagnosis not present

## 2023-10-17 DIAGNOSIS — I69320 Aphasia following cerebral infarction: Secondary | ICD-10-CM | POA: Diagnosis not present

## 2023-10-17 DIAGNOSIS — R2681 Unsteadiness on feet: Secondary | ICD-10-CM | POA: Diagnosis not present

## 2023-10-17 DIAGNOSIS — I69391 Dysphagia following cerebral infarction: Secondary | ICD-10-CM | POA: Diagnosis not present

## 2023-10-17 LAB — CBC AND DIFFERENTIAL
HCT: 38 — AB (ref 41–53)
Hemoglobin: 13.3 — AB (ref 13.5–17.5)
Platelets: 181 K/uL (ref 150–400)
WBC: 8.5

## 2023-10-17 LAB — BASIC METABOLIC PANEL WITH GFR
BUN: 18 (ref 4–21)
CO2: 32 — AB (ref 13–22)
Chloride: 104 (ref 99–108)
Creatinine: 1.2 (ref 0.6–1.3)
Glucose: 103
Potassium: 4.2 meq/L (ref 3.5–5.1)
Sodium: 142 (ref 137–147)

## 2023-10-17 LAB — COMPREHENSIVE METABOLIC PANEL WITH GFR
Albumin: 4 (ref 3.5–5.0)
Calcium: 9.1 (ref 8.7–10.7)
eGFR: 56

## 2023-10-17 LAB — CBC: RBC: 4.15 (ref 3.87–5.11)

## 2023-10-17 NOTE — Telephone Encounter (Signed)
 Returned a call back to the pts daughter Olam (on [place]) and informed her that per Glendia Ferrier PA-C (covering for Orren Fabry PA-C), he recommends pt discuss with his PCP first.   Olam verbalized understanding and agrees with this plan.  Per Olam, she called his PCP today and was able to get him appt soon to further discuss this matter.   Olam was very appreciative for all the assistance provided.

## 2023-10-17 NOTE — Telephone Encounter (Signed)
 Agree. Would recommend discussing with PCP first. Glendia Ferrier, PA-C    10/17/2023 5:18 PM

## 2023-10-18 DIAGNOSIS — M6281 Muscle weakness (generalized): Secondary | ICD-10-CM | POA: Diagnosis not present

## 2023-10-18 DIAGNOSIS — I69320 Aphasia following cerebral infarction: Secondary | ICD-10-CM | POA: Diagnosis not present

## 2023-10-18 DIAGNOSIS — I69391 Dysphagia following cerebral infarction: Secondary | ICD-10-CM | POA: Diagnosis not present

## 2023-10-18 DIAGNOSIS — R278 Other lack of coordination: Secondary | ICD-10-CM | POA: Diagnosis not present

## 2023-10-18 DIAGNOSIS — R2681 Unsteadiness on feet: Secondary | ICD-10-CM | POA: Diagnosis not present

## 2023-10-19 DIAGNOSIS — I69391 Dysphagia following cerebral infarction: Secondary | ICD-10-CM | POA: Diagnosis not present

## 2023-10-19 DIAGNOSIS — R278 Other lack of coordination: Secondary | ICD-10-CM | POA: Diagnosis not present

## 2023-10-19 DIAGNOSIS — I69320 Aphasia following cerebral infarction: Secondary | ICD-10-CM | POA: Diagnosis not present

## 2023-10-19 DIAGNOSIS — R2681 Unsteadiness on feet: Secondary | ICD-10-CM | POA: Diagnosis not present

## 2023-10-19 DIAGNOSIS — M6281 Muscle weakness (generalized): Secondary | ICD-10-CM | POA: Diagnosis not present

## 2023-10-22 ENCOUNTER — Non-Acute Institutional Stay: Payer: Self-pay | Admitting: Sports Medicine

## 2023-10-22 ENCOUNTER — Encounter: Payer: Self-pay | Admitting: Sports Medicine

## 2023-10-22 DIAGNOSIS — I4892 Unspecified atrial flutter: Secondary | ICD-10-CM | POA: Diagnosis not present

## 2023-10-22 DIAGNOSIS — I639 Cerebral infarction, unspecified: Secondary | ICD-10-CM

## 2023-10-22 DIAGNOSIS — M6281 Muscle weakness (generalized): Secondary | ICD-10-CM | POA: Diagnosis not present

## 2023-10-22 DIAGNOSIS — I69391 Dysphagia following cerebral infarction: Secondary | ICD-10-CM | POA: Diagnosis not present

## 2023-10-22 DIAGNOSIS — R2681 Unsteadiness on feet: Secondary | ICD-10-CM | POA: Diagnosis not present

## 2023-10-22 DIAGNOSIS — K5901 Slow transit constipation: Secondary | ICD-10-CM

## 2023-10-22 DIAGNOSIS — I10 Essential (primary) hypertension: Secondary | ICD-10-CM

## 2023-10-22 DIAGNOSIS — E039 Hypothyroidism, unspecified: Secondary | ICD-10-CM | POA: Diagnosis not present

## 2023-10-22 DIAGNOSIS — K219 Gastro-esophageal reflux disease without esophagitis: Secondary | ICD-10-CM

## 2023-10-22 DIAGNOSIS — R278 Other lack of coordination: Secondary | ICD-10-CM | POA: Diagnosis not present

## 2023-10-22 DIAGNOSIS — I69320 Aphasia following cerebral infarction: Secondary | ICD-10-CM | POA: Diagnosis not present

## 2023-10-22 NOTE — Progress Notes (Signed)
 Provider:  Sherlynn Albert, MD  Location:  [facility]  Nursing Home Room Number: 907-A Place of Service:  ALF ([phone])  PCP: Seabron Lenis, MD Patient Care Team: Seabron Lenis, MD as PCP - General (Family Medicine) Hobart Powell BRAVO, MD (Inactive) as PCP - Cardiology (Cardiology)  Extended Emergency Contact Information Primary Emergency Contact: Hetz,LISA Home Phone: [phone] Relation: Daughter Secondary Emergency Contact: Willetts,Teresa  [place]: [phone] Relation: Daughter  Code Status: Full Code  Goals of Care: Advanced Directive information    10/01/2023    9:53 AM  Advanced Directives  Does Patient Have a Medical Advance Directive? No  Would patient like information on creating a medical advance directive? No - Patient declined      Chief Complaint  Patient presents with   New admission    HPI: Patient is a 89 y.o. male with past medical history of hypertension, A-fib, psoriasis, hypothyroidism is seen today for admission to assisted living. Patient seen and examined in his room.  He is sitting in his recliner chair.  Seems pleasant and comfortable and does not appear to be in distress. He ambulates with a walker.  He eats his lunch at daily.  Denies being dizzy, lightheadedness. Patient denies chest pain, shortness of breath, abdominal pain, nausea, vomiting, dysuria, hematuria, bloody or black-colored stools. Patient reports intermittent coughing while drinking water. He was recently started on Macrobid for UTI. Patient knows his name, oriented to time and place. As per nursing staff no new concerns.  Past Medical History:  Diagnosis Date   Glaucoma    Hypertension    Psoriasis    Past Surgical History:  Procedure Laterality Date   GLAUCOMA SURGERY Left    HERNIA REPAIR     HYDROCELE EXCISION / REPAIR      reports that he has quit smoking. His smoking use included pipe. He has never been exposed to  tobacco smoke. He has never used smokeless tobacco. He reports current alcohol use of about 7.0 standard drinks of alcohol per week. He reports that he does not use drugs. Social History   Socioeconomic History   Marital status: Married    Spouse name: Not on file   Number of children: Not on file   Years of education: Not on file   Highest education level: Not on file  Occupational History   Not on file  Tobacco Use   Smoking status: Former    Types: Pipe    Passive exposure: Never   Smokeless tobacco: Never   Tobacco comments:    quit >40 years ago  Vaping Use   Vaping status: Never Used  Substance and Sexual Activity   Alcohol use: Yes    Alcohol/week: 7.0 standard drinks of alcohol    Types: 7 Standard drinks or equivalent per week    Comment: 1 drink daily   Drug use: Never   Sexual activity: Not Currently  Other Topics Concern   Not on file  Social History Narrative   Wife has alzheimer's   Social Drivers of Health   Financial Resource Strain: Not on file  Food Insecurity: No Food Insecurity (08/30/2023)   Hunger Vital Sign    Worried About Running Out of Food in the Last Year: Never true    Ran Out of Food in the Last Year: Never true  Transportation Needs: No Transportation Needs (08/30/2023)   PRAPARE - Transportation    Lack of Transportation (Medical): No    Lack of  Transportation (Non-Medical): No  Physical Activity: Not on file  Stress: Not on file  Social Connections: Moderately Integrated (08/30/2023)   Social Connection and Isolation Panel    Frequency of Communication with Friends and Family: Three times a week    Frequency of Social Gatherings with Friends and Family: Three times a week    Attends Religious Services: 1 to 4 times per year    Active Member of Clubs or Organizations: No    Attends Banker Meetings: 1 to 4 times per year    Marital Status: Widowed  Intimate Partner Violence: Not At Risk (08/30/2023)   Humiliation, Afraid,  Rape, and Kick questionnaire    Fear of Current or Ex-Partner: No    Emotionally Abused: No    Physically Abused: No    Sexually Abused: No    Functional Status Survey:    Family History  Problem Relation Age of Onset   High blood pressure Mother    Stroke Mother    High blood pressure Sister     Health Maintenance  Topic Date Due   COVID-19 Vaccine (9 - 2024-25 season) 06/14/2023   INFLUENZA VACCINE  11/09/2023   Medicare Annual Wellness (AWV)  05/23/2024   DTaP/Tdap/Td (3 - Tdap) 05/07/2031   Pneumococcal Vaccine: 50+ Years  Completed   Zoster Vaccines- Shingrix  Completed   Hepatitis B Vaccines  Aged Out   HPV VACCINES  Aged Out   Meningococcal B Vaccine  Aged Out    No Known Allergies  Allergies as of 10/22/2023   No Known Allergies      Medication List        Accurate as of October 22, 2023  1:55 PM. If you have any questions, ask your nurse or doctor.          amLODipine  5 MG tablet Commonly known as: NORVASC  Take 5 mg by mouth daily.   apixaban  5 MG Tabs tablet Commonly known as: Eliquis  Take 1 tablet (5 mg total) by mouth 2 (two) times daily. NEEDS LABS FOR ELIQUIS  REFILLS, COME TO OFFICE.  THANK YOU   atorvastatin  40 MG tablet Commonly known as: LIPITOR Take 1 tablet (40 mg total) by mouth at bedtime.   bimatoprost 0.01 % Soln Commonly known as: LUMIGAN Place 1 drop into the right eye at bedtime.   bisacodyl  10 MG suppository Commonly known as: DULCOLAX Place 1 suppository (10 mg total) rectally daily as needed for moderate constipation.   Combigan  0.2-0.5 % ophthalmic solution Generic drug: brimonidine -timolol  Place 1 drop into both eyes in the morning and at bedtime.   cyanocobalamin  1000 MCG tablet Commonly known as: VITAMIN B12 Take 1,000 mcg by mouth daily.   dorzolamide  2 % ophthalmic solution Commonly known as: TRUSOPT  Place 1 drop into both eyes in the morning and at bedtime.   levothyroxine  75 MCG tablet Commonly known as:  SYNTHROID  Take 75 mcg by mouth every 5 (five) hours as needed. After taking Omeprazole   Metamucil 4 in 1 Fiber 55.6 % Powd Generic drug: Psyllium Take 3.4 g by mouth at bedtime.   MULTIVITAMIN ADULT PO Take 1 tablet by mouth daily with breakfast.   nitrofurantoin (macrocrystal-monohydrate) 100 MG capsule Commonly known as: MACROBID Take 100 mg by mouth 2 (two) times daily.   omeprazole 40 MG capsule Commonly known as: PRILOSEC Take 40 mg by mouth daily before breakfast.   polyethylene glycol 17 g packet Commonly known as: MIRALAX  / GLYCOLAX  Take 17 g by mouth daily.  Review of Systems  Constitutional:  Negative for fever.  Respiratory:  Negative for shortness of breath and wheezing.   Cardiovascular:  Negative for chest pain, palpitations and leg swelling.  Gastrointestinal:  Negative for abdominal distention, abdominal pain, blood in stool, constipation, diarrhea, nausea and vomiting.  Genitourinary:  Negative for dysuria and frequency.    Vitals:   10/22/23 1335  BP: 128/78  Pulse: 68  Resp: 18  Temp: (!) 97.4 F (36.3 C)  SpO2: 98%  Weight: 163 lb 6.4 oz (74.1 kg)  Height: 6' 1 (1.854 m)   Body mass index is 21.56 kg/m. Physical Exam Constitutional:      Appearance: Normal appearance.  Cardiovascular:     Rate and Rhythm: Normal rate and regular rhythm.     Pulses: Normal pulses.     Heart sounds: Normal heart sounds.  Pulmonary:     Effort: No respiratory distress.     Breath sounds: No stridor. No wheezing or rales.  Abdominal:     General: Bowel sounds are normal. There is no distension.     Palpations: Abdomen is soft.     Tenderness: There is no abdominal tenderness. There is no guarding.  Musculoskeletal:        General: No swelling.  Neurological:     Mental Status: He is alert. Mental status is at baseline.     Motor: No weakness.     Labs reviewed: Basic Metabolic Panel: Recent Labs    08/30/23 0500 08/31/23 0633  09/01/23 0742  NA 133* 139 139  K 3.1* 3.4* 4.0  CL 101 105 107  CO2 25 26 23   GLUCOSE 95 95 95  BUN 16 18 18   CREATININE 0.85 1.04 1.12  CALCIUM  8.6* 8.9 9.2  MG  --   --  2.1   Liver Function Tests: Recent Labs    08/29/23 1240  AST 20  ALT 9  ALKPHOS 53  BILITOT 1.1  PROT 6.3*  ALBUMIN 3.3*   No results for input(s): LIPASE, AMYLASE in the last 8760 hours. No results for input(s): AMMONIA in the last 8760 hours. CBC: Recent Labs    08/29/23 1240 08/30/23 0500 09/01/23 0742  WBC 7.3 7.4 7.4  HGB 13.5 13.4 14.5  HCT 40.6 40.0 45.6  MCV 95.5 93.7 98.7  PLT 180 180 175   Cardiac Enzymes: No results for input(s): CKTOTAL, CKMB, CKMBINDEX, TROPONINI in the last 8760 hours. BNP: Invalid input(s): POCBNP Lab Results  Component Value Date   HGBA1C 5.0 08/29/2023   Lab Results  Component Value Date   TSH 2.771 08/30/2023   No results found for: VITAMINB12 No results found for: FOLATE No results found for: IRON, TIBC, FERRITIN  Imaging and Procedures obtained prior to SNF admission: ECHOCARDIOGRAM COMPLETE Result Date: 08/30/2023    ECHOCARDIOGRAM REPORT   Patient Name:   SANTEZ WOODCOX Date of Exam: 08/30/2023 Medical Rec #:  [id number]       Height:       73.0 in Accession #:    [id number]      Weight:       165.3 lb Date of Birth:  Feb 23, 1930       BSA:          1.984 m Patient Age:    89 years        BP:           140/72 mmHg Patient Gender: M  HR:           57 bpm. Exam Location:  Inpatient Procedure: 2D Echo, Cardiac Doppler and Color Doppler (Both Spectral and Color            Flow Doppler were utilized during procedure). Indications:    Stroke  History:        Patient has prior history of Echocardiogram examinations.                 Arrythmias:Atrial Flutter; Risk Factors:Hypertension and Former                 Smoker.  Sonographer:    Garnette Moats Referring Phys: [id number] BINAYA DAHAL IMPRESSIONS  1. Left ventricular  ejection fraction, by estimation, is 55 to 60%. The left ventricle has normal function. The left ventricle has no regional wall motion abnormalities. Left ventricular diastolic function could not be evaluated.  2. Right ventricular systolic function is normal. The right ventricular size is normal.  3. The mitral valve is normal in structure. No evidence of mitral valve regurgitation. No evidence of mitral stenosis.  4. The aortic valve is normal in structure. Aortic valve regurgitation is not visualized. No aortic stenosis is present.  5. Aortic dilatation noted. There is borderline dilatation of the ascending aorta, measuring 39 mm.  6. The inferior vena cava is normal in size with greater than 50% respiratory variability, suggesting right atrial pressure of 3 mmHg. FINDINGS  Left Ventricle: Left ventricular ejection fraction, by estimation, is 55 to 60%. The left ventricle has normal function. The left ventricle has no regional wall motion abnormalities. The left ventricular internal cavity size was normal in size. There is  no left ventricular hypertrophy. Left ventricular diastolic function could not be evaluated due to atrial fibrillation. Left ventricular diastolic function could not be evaluated. Right Ventricle: The right ventricular size is normal. No increase in right ventricular wall thickness. Right ventricular systolic function is normal. Left Atrium: Left atrial size was normal in size. Right Atrium: Right atrial size was normal in size. Pericardium: There is no evidence of pericardial effusion. Mitral Valve: The mitral valve is normal in structure. No evidence of mitral valve regurgitation. No evidence of mitral valve stenosis. MV peak gradient, 3.9 mmHg. The mean mitral valve gradient is 1.0 mmHg. Tricuspid Valve: The tricuspid valve is normal in structure. Tricuspid valve regurgitation is not demonstrated. No evidence of tricuspid stenosis. Aortic Valve: The aortic valve is normal in structure.  Aortic valve regurgitation is not visualized. No aortic stenosis is present. Aortic valve peak gradient measures 4.7 mmHg. Pulmonic Valve: The pulmonic valve was normal in structure. Pulmonic valve regurgitation is not visualized. No evidence of pulmonic stenosis. Aorta: Aortic dilatation noted. There is borderline dilatation of the ascending aorta, measuring 39 mm. Venous: The inferior vena cava is normal in size with greater than 50% respiratory variability, suggesting right atrial pressure of 3 mmHg. IAS/Shunts: No atrial level shunt detected by color flow Doppler.  LEFT VENTRICLE PLAX 2D LVIDd:         4.90 cm   Diastology LVIDs:         3.30 cm   LV e' medial:    5.66 cm/s LV PW:         1.10 cm   LV E/e' medial:  11.1 LV IVS:        1.10 cm   LV e' lateral:   5.66 cm/s LVOT diam:     2.10 cm   LV  E/e' lateral: 11.1 LV SV:         66 LV SV Index:   33 LVOT Area:     3.46 cm  RIGHT VENTRICLE          IVC RV Basal diam:  3.90 cm  IVC diam: 1.80 cm LEFT ATRIUM             Index        RIGHT ATRIUM           Index LA Vol (A2C):   60.0 ml 30.23 ml/m  RA Area:     19.20 cm LA Vol (A4C):   45.1 ml 22.73 ml/m  RA Volume:   49.70 ml  25.04 ml/m LA Biplane Vol: 52.5 ml 26.46 ml/m  AORTIC VALVE AV Area (Vmax): 2.66 cm AV Vmax:        108.00 cm/s AV Peak Grad:   4.7 mmHg LVOT Vmax:      82.90 cm/s LVOT Vmean:     55.400 cm/s LVOT VTI:       0.191 m  AORTA Ao Root diam: 3.50 cm Ao Asc diam:  3.90 cm MITRAL VALVE               TRICUSPID VALVE MV Area (PHT): 2.38 cm    TR Peak grad:   29.2 mmHg MV Area VTI:   2.68 cm    TR Vmax:        270.00 cm/s MV Peak grad:  3.9 mmHg MV Mean grad:  1.0 mmHg    SHUNTS MV Vmax:       0.99 m/s    Systemic VTI:  0.19 m MV Vmean:      51.1 cm/s   Systemic Diam: 2.10 cm MV Decel Time: 319 msec MV E velocity: 62.60 cm/s MV A velocity: 81.40 cm/s MV E/A ratio:  0.77 Aditya Sabharwal Electronically signed by Ria Commander Signature Date/Time: 08/30/2023/3:05:15 PM    Final    CT ANGIO  HEAD NECK W WO CM Result Date: 08/29/2023 EXAM: CT HEAD WITHOUT CTA HEAD AND NECK WITH AND WITHOUT 08/29/2023 06:31:52 PM TECHNIQUE: CTA of the head and neck was performed with and without the administration of intravenous contrast. Noncontrast CT of the head with reconstructed 2-D images are also provided for review. Multiplanar 2D and/or 3D reformatted images are provided for review. Automated exposure control, iterative reconstruction, and/or weight based adjustment of the mA/kV was utilized to reduce the radiation dose to as low as reasonably achievable. COMPARISON: MRI of the head without contrast 08/29/2023 CLINICAL HISTORY: Neuro deficit, acute, stroke suspected; concern for right PCA infarct. Notes from triage: Patient to ED by EMS from [facility] for AMS. Per EMS he has been having frequent urination, confusion and hallucinations for a few days. FINDINGS: CT HEAD: BRAIN AND VENTRICLES: The non-contrast head CT demonstrates an acute/subacute non-hemorrhagic infarct in the posterior inferior right temporal lobe and inferior right occipital lobe. The area of acute infarction in the left splenium of the corpus callosum is not appreciated by non-contrast head CT. Moderate confluent periventricular white matter hypoattenuation is again noted bilaterally. ORBITS: The visualized portion of the orbits demonstrate no acute abnormality. SINUSES: The visualized paranasal sinuses and mastoid air cells demonstrate no acute abnormality. SOFT TISSUES AND SKULL: No acute abnormality of the visualized skull or soft tissues. CTA NECK: AORTIC ARCH AND ARCH VESSELS: The aortic arch is incompletely imaged. Atherosclerotic changes are present in the left subclavian artery without focal stenosis. CERVICAL  CAROTID ARTERIES: Atherosclerotic changes are present at the bifurcation of proximal right ICA without significant stenosis. Mild tortuosity is present in the distal cervical right ICA without significant  stenosis. Scattered faint calcifications are present in the distal left common carotid artery without significant stenosis. Atherosclerotic calcifications at the left carotid artery are more prominent than on the right. No significant stenosis is present relative to the more distal vessel. Mild tortuosity of the cervical left ICA is present just below the bifurcation without significant stenosis. CERVICAL VERTEBRAL ARTERIES: The left vertebral artery is the dominant vessel. Atherosclerotic calcifications are present at the dural margin of the right vertebral artery. A 50% stenosis is present. SOFT TISSUES: The lung apices are clear. No cervical or superior mediastinal lymphadenopathy. The larynx and pharynx are unremarkable. No acute abnormality of the salivary and thyroid  glands. BONES: Multilevel degenerative changes are present in the cervical spine. Straightening of the normal cervical lordosis is present. Moderate-to-severe foraminal stenosis is present bilaterally at C4-5 and on the right at C3-4. No focal osseous lesions are present. CTA HEAD: ANTERIOR CIRCULATION: Atherosclerotic calcifications are present within the cavernous internal carotid arteries bilaterally without significant stenosis through the ICA termini. Mild Narrowing is present in the nondominant right A1 segment. Scattered distal segmental narrowing is present within the ACA and MCA branch vessels consistent with intracranial atherosclerotic disease. No significant proximal stenosis or occlusion is present in the ACA or MCA branch vessels. No aneurysm. POSTERIOR CIRCULATION: The posterior cerebral arteries originate from the basilar tip. Moderate stenoses are present in the P2 segments bilaterally, left greater than right. No aneurysm. OTHER: No dural venous sinus thrombosis on this non-dedicated study. IMPRESSION: 1. Acute/subacute non-hemorrhagic infarct in the posterior inferior right temporal lobe and inferior right occipital lobe. 2.  Moderate stenoses in the P2 segments of the posterior cerebral arteries bilaterally, left greater than right. 3. Atherosclerotic changes at the carotid bifurcations bilaterally without significant stenosis. 4. Tortuosity of the distal cervical internal carotid arteries bilaterally without focal stenosis. This is nonspecific, but suggests chronic hypertension. 5. Atherosclerotic calcifications at the dural margin of the right vertebral artery with 50% stenosis. Electronically signed by: Lonni Necessary MD 08/29/2023 07:47 PM EDT RP Workstation: [id number]   MR BRAIN WO CONTRAST Result Date: 08/29/2023 CLINICAL DATA:  Neuro deficit, acute, stroke suspected. Left sided hemianopsia, visual hallucinations. EXAM: MRI HEAD WITHOUT CONTRAST TECHNIQUE: Multiplanar, multiecho pulse sequences of the brain and surrounding structures were obtained without intravenous contrast. COMPARISON:  None Available. FINDINGS: Brain: There is a large acute to early subacute right PCA infarct involving the inferior right occipital and posterior temporal lobes. There is also a 1 cm subacute infarct involving the left lateral aspect of the splenium of the corpus callosum. There is well-defined cytotoxic edema associated with the right PCA infarct without significant mass effect. No intracranial hemorrhage, midline shift, hydrocephalus, or extra-axial fluid collection is identified. A small chronic infarct is noted in the posterolateral left temporal lobe, and there is also a tiny chronic cortical infarct in the left precentral gyrus. Patchy to confluent T2 hyperintensities elsewhere in cerebral white matter bilaterally are nonspecific but compatible with moderate to severe chronic small vessel ischemic disease. There are small chronic bilateral cerebellar infarcts. There is moderate cerebral atrophy. Vascular: Major intracranial vascular flow voids are preserved. Skull and upper cervical spine: Unremarkable bone marrow signal.  Sinuses/Orbits: Bilateral cataract extraction. Minimal mucosal thickening in the left maxillary sinus. Clear mastoid air cells. Other: None. These results were called by telephone at the  time of interpretation on 08/29/2023 at 3:41 pm to Dr. Donnice Hughes , who verbally acknowledged these results. IMPRESSION: 1. Large acute to early subacute right PCA infarct. 2. Small subacute infarct in the splenium of the corpus callosum on the left. 3. Moderate to severe chronic small vessel ischemic disease. 4. Chronic left temporal infarct. Electronically Signed   By: Dasie Hamburg M.D.   On: 08/29/2023 15:45    Assessment/Plan  History of CVA Patient was recently hospitalized in May for acute right PCA Continue with Lipitor    History of hypertension Continue with amlodipine  5 mg   Hypothyroidism Continue with levothyroxine  75 mcg Lab Results  Component Value Date   TSH 2.771 08/30/2023     TSH at goal  History of A-fib Continue with Eliquis  No signs of bleeding  Constipation  continue with MiraLAX   GERD Denies heartburn, acid reflux Continue with omeprazole  Dysphagia Patient complains of intermittent coughing while feeding Will order speech evaluation.  30 min Total time spent for obtaining history,  performing a medically appropriate examination and evaluation, reviewing the tests,   documenting clinical information in the electronic or other health record,  ,care coordination (not separately reported)

## 2023-10-23 ENCOUNTER — Encounter: Payer: Self-pay | Admitting: Sports Medicine

## 2023-10-23 DIAGNOSIS — I69320 Aphasia following cerebral infarction: Secondary | ICD-10-CM | POA: Diagnosis not present

## 2023-10-23 DIAGNOSIS — R2681 Unsteadiness on feet: Secondary | ICD-10-CM | POA: Diagnosis not present

## 2023-10-23 DIAGNOSIS — M6281 Muscle weakness (generalized): Secondary | ICD-10-CM | POA: Diagnosis not present

## 2023-10-23 DIAGNOSIS — I69391 Dysphagia following cerebral infarction: Secondary | ICD-10-CM | POA: Diagnosis not present

## 2023-10-23 DIAGNOSIS — R278 Other lack of coordination: Secondary | ICD-10-CM | POA: Diagnosis not present

## 2023-10-23 NOTE — Telephone Encounter (Signed)
 Assisted Living Resident @ [facility]

## 2023-10-23 NOTE — Telephone Encounter (Signed)
 Abstracted labs and forwarded to Dr. Sherlynn

## 2023-10-24 DIAGNOSIS — R2681 Unsteadiness on feet: Secondary | ICD-10-CM | POA: Diagnosis not present

## 2023-10-24 DIAGNOSIS — I69391 Dysphagia following cerebral infarction: Secondary | ICD-10-CM | POA: Diagnosis not present

## 2023-10-24 DIAGNOSIS — I69320 Aphasia following cerebral infarction: Secondary | ICD-10-CM | POA: Diagnosis not present

## 2023-10-24 DIAGNOSIS — M6281 Muscle weakness (generalized): Secondary | ICD-10-CM | POA: Diagnosis not present

## 2023-10-24 DIAGNOSIS — R278 Other lack of coordination: Secondary | ICD-10-CM | POA: Diagnosis not present

## 2023-10-25 DIAGNOSIS — R278 Other lack of coordination: Secondary | ICD-10-CM | POA: Diagnosis not present

## 2023-10-25 DIAGNOSIS — R2681 Unsteadiness on feet: Secondary | ICD-10-CM | POA: Diagnosis not present

## 2023-10-25 DIAGNOSIS — I69391 Dysphagia following cerebral infarction: Secondary | ICD-10-CM | POA: Diagnosis not present

## 2023-10-25 DIAGNOSIS — M6281 Muscle weakness (generalized): Secondary | ICD-10-CM | POA: Diagnosis not present

## 2023-10-25 DIAGNOSIS — I69320 Aphasia following cerebral infarction: Secondary | ICD-10-CM | POA: Diagnosis not present

## 2023-10-26 DIAGNOSIS — R278 Other lack of coordination: Secondary | ICD-10-CM | POA: Diagnosis not present

## 2023-10-26 DIAGNOSIS — M6281 Muscle weakness (generalized): Secondary | ICD-10-CM | POA: Diagnosis not present

## 2023-10-26 DIAGNOSIS — R2681 Unsteadiness on feet: Secondary | ICD-10-CM | POA: Diagnosis not present

## 2023-10-26 DIAGNOSIS — I69391 Dysphagia following cerebral infarction: Secondary | ICD-10-CM | POA: Diagnosis not present

## 2023-10-26 DIAGNOSIS — I69320 Aphasia following cerebral infarction: Secondary | ICD-10-CM | POA: Diagnosis not present

## 2023-10-27 DIAGNOSIS — R2681 Unsteadiness on feet: Secondary | ICD-10-CM | POA: Diagnosis not present

## 2023-10-27 DIAGNOSIS — R278 Other lack of coordination: Secondary | ICD-10-CM | POA: Diagnosis not present

## 2023-10-27 DIAGNOSIS — M6281 Muscle weakness (generalized): Secondary | ICD-10-CM | POA: Diagnosis not present

## 2023-10-27 DIAGNOSIS — I69320 Aphasia following cerebral infarction: Secondary | ICD-10-CM | POA: Diagnosis not present

## 2023-10-27 DIAGNOSIS — I69391 Dysphagia following cerebral infarction: Secondary | ICD-10-CM | POA: Diagnosis not present

## 2023-10-28 DIAGNOSIS — R2681 Unsteadiness on feet: Secondary | ICD-10-CM | POA: Diagnosis not present

## 2023-10-28 DIAGNOSIS — R278 Other lack of coordination: Secondary | ICD-10-CM | POA: Diagnosis not present

## 2023-10-28 DIAGNOSIS — I69391 Dysphagia following cerebral infarction: Secondary | ICD-10-CM | POA: Diagnosis not present

## 2023-10-28 DIAGNOSIS — M6281 Muscle weakness (generalized): Secondary | ICD-10-CM | POA: Diagnosis not present

## 2023-10-28 DIAGNOSIS — I69320 Aphasia following cerebral infarction: Secondary | ICD-10-CM | POA: Diagnosis not present

## 2023-10-29 DIAGNOSIS — R278 Other lack of coordination: Secondary | ICD-10-CM | POA: Diagnosis not present

## 2023-10-29 DIAGNOSIS — I69320 Aphasia following cerebral infarction: Secondary | ICD-10-CM | POA: Diagnosis not present

## 2023-10-29 DIAGNOSIS — R2681 Unsteadiness on feet: Secondary | ICD-10-CM | POA: Diagnosis not present

## 2023-10-29 DIAGNOSIS — M6281 Muscle weakness (generalized): Secondary | ICD-10-CM | POA: Diagnosis not present

## 2023-10-29 DIAGNOSIS — I69391 Dysphagia following cerebral infarction: Secondary | ICD-10-CM | POA: Diagnosis not present

## 2023-10-30 DIAGNOSIS — R2681 Unsteadiness on feet: Secondary | ICD-10-CM | POA: Diagnosis not present

## 2023-10-30 DIAGNOSIS — H532 Diplopia: Secondary | ICD-10-CM | POA: Diagnosis not present

## 2023-10-30 DIAGNOSIS — I69320 Aphasia following cerebral infarction: Secondary | ICD-10-CM | POA: Diagnosis not present

## 2023-10-30 DIAGNOSIS — H401122 Primary open-angle glaucoma, left eye, moderate stage: Secondary | ICD-10-CM | POA: Diagnosis not present

## 2023-10-30 DIAGNOSIS — H0102A Squamous blepharitis right eye, upper and lower eyelids: Secondary | ICD-10-CM | POA: Diagnosis not present

## 2023-10-30 DIAGNOSIS — M6281 Muscle weakness (generalized): Secondary | ICD-10-CM | POA: Diagnosis not present

## 2023-10-30 DIAGNOSIS — H401113 Primary open-angle glaucoma, right eye, severe stage: Secondary | ICD-10-CM | POA: Diagnosis not present

## 2023-10-30 DIAGNOSIS — I69391 Dysphagia following cerebral infarction: Secondary | ICD-10-CM | POA: Diagnosis not present

## 2023-10-30 DIAGNOSIS — H0102B Squamous blepharitis left eye, upper and lower eyelids: Secondary | ICD-10-CM | POA: Diagnosis not present

## 2023-10-30 DIAGNOSIS — R278 Other lack of coordination: Secondary | ICD-10-CM | POA: Diagnosis not present

## 2023-10-30 DIAGNOSIS — Z961 Presence of intraocular lens: Secondary | ICD-10-CM | POA: Diagnosis not present

## 2023-10-31 DIAGNOSIS — R278 Other lack of coordination: Secondary | ICD-10-CM | POA: Diagnosis not present

## 2023-10-31 DIAGNOSIS — M6281 Muscle weakness (generalized): Secondary | ICD-10-CM | POA: Diagnosis not present

## 2023-10-31 DIAGNOSIS — I69391 Dysphagia following cerebral infarction: Secondary | ICD-10-CM | POA: Diagnosis not present

## 2023-10-31 DIAGNOSIS — R2681 Unsteadiness on feet: Secondary | ICD-10-CM | POA: Diagnosis not present

## 2023-10-31 DIAGNOSIS — I69320 Aphasia following cerebral infarction: Secondary | ICD-10-CM | POA: Diagnosis not present

## 2023-11-01 DIAGNOSIS — R278 Other lack of coordination: Secondary | ICD-10-CM | POA: Diagnosis not present

## 2023-11-01 DIAGNOSIS — M6281 Muscle weakness (generalized): Secondary | ICD-10-CM | POA: Diagnosis not present

## 2023-11-01 DIAGNOSIS — R2681 Unsteadiness on feet: Secondary | ICD-10-CM | POA: Diagnosis not present

## 2023-11-01 DIAGNOSIS — I69391 Dysphagia following cerebral infarction: Secondary | ICD-10-CM | POA: Diagnosis not present

## 2023-11-01 DIAGNOSIS — I69320 Aphasia following cerebral infarction: Secondary | ICD-10-CM | POA: Diagnosis not present

## 2023-11-02 DIAGNOSIS — I69391 Dysphagia following cerebral infarction: Secondary | ICD-10-CM | POA: Diagnosis not present

## 2023-11-02 DIAGNOSIS — R278 Other lack of coordination: Secondary | ICD-10-CM | POA: Diagnosis not present

## 2023-11-02 DIAGNOSIS — R2681 Unsteadiness on feet: Secondary | ICD-10-CM | POA: Diagnosis not present

## 2023-11-02 DIAGNOSIS — M6281 Muscle weakness (generalized): Secondary | ICD-10-CM | POA: Diagnosis not present

## 2023-11-02 DIAGNOSIS — I69320 Aphasia following cerebral infarction: Secondary | ICD-10-CM | POA: Diagnosis not present

## 2023-11-05 DIAGNOSIS — R278 Other lack of coordination: Secondary | ICD-10-CM | POA: Diagnosis not present

## 2023-11-05 DIAGNOSIS — R2681 Unsteadiness on feet: Secondary | ICD-10-CM | POA: Diagnosis not present

## 2023-11-05 DIAGNOSIS — I69320 Aphasia following cerebral infarction: Secondary | ICD-10-CM | POA: Diagnosis not present

## 2023-11-05 DIAGNOSIS — M6281 Muscle weakness (generalized): Secondary | ICD-10-CM | POA: Diagnosis not present

## 2023-11-05 DIAGNOSIS — I69391 Dysphagia following cerebral infarction: Secondary | ICD-10-CM | POA: Diagnosis not present

## 2023-11-06 DIAGNOSIS — R2681 Unsteadiness on feet: Secondary | ICD-10-CM | POA: Diagnosis not present

## 2023-11-06 DIAGNOSIS — I69320 Aphasia following cerebral infarction: Secondary | ICD-10-CM | POA: Diagnosis not present

## 2023-11-06 DIAGNOSIS — M6281 Muscle weakness (generalized): Secondary | ICD-10-CM | POA: Diagnosis not present

## 2023-11-06 DIAGNOSIS — R278 Other lack of coordination: Secondary | ICD-10-CM | POA: Diagnosis not present

## 2023-11-06 DIAGNOSIS — I69391 Dysphagia following cerebral infarction: Secondary | ICD-10-CM | POA: Diagnosis not present

## 2023-11-07 DIAGNOSIS — R2681 Unsteadiness on feet: Secondary | ICD-10-CM | POA: Diagnosis not present

## 2023-11-07 DIAGNOSIS — M6281 Muscle weakness (generalized): Secondary | ICD-10-CM | POA: Diagnosis not present

## 2023-11-07 DIAGNOSIS — I69320 Aphasia following cerebral infarction: Secondary | ICD-10-CM | POA: Diagnosis not present

## 2023-11-07 DIAGNOSIS — I69391 Dysphagia following cerebral infarction: Secondary | ICD-10-CM | POA: Diagnosis not present

## 2023-11-07 DIAGNOSIS — R278 Other lack of coordination: Secondary | ICD-10-CM | POA: Diagnosis not present

## 2023-11-08 DIAGNOSIS — R278 Other lack of coordination: Secondary | ICD-10-CM | POA: Diagnosis not present

## 2023-11-08 DIAGNOSIS — R2681 Unsteadiness on feet: Secondary | ICD-10-CM | POA: Diagnosis not present

## 2023-11-08 DIAGNOSIS — M6281 Muscle weakness (generalized): Secondary | ICD-10-CM | POA: Diagnosis not present

## 2023-11-08 DIAGNOSIS — I69391 Dysphagia following cerebral infarction: Secondary | ICD-10-CM | POA: Diagnosis not present

## 2023-11-08 DIAGNOSIS — I69320 Aphasia following cerebral infarction: Secondary | ICD-10-CM | POA: Diagnosis not present

## 2023-11-09 DIAGNOSIS — I69320 Aphasia following cerebral infarction: Secondary | ICD-10-CM | POA: Diagnosis not present

## 2023-11-09 DIAGNOSIS — I69391 Dysphagia following cerebral infarction: Secondary | ICD-10-CM | POA: Diagnosis not present

## 2023-11-09 DIAGNOSIS — M6281 Muscle weakness (generalized): Secondary | ICD-10-CM | POA: Diagnosis not present

## 2023-11-09 DIAGNOSIS — R2681 Unsteadiness on feet: Secondary | ICD-10-CM | POA: Diagnosis not present

## 2023-11-09 DIAGNOSIS — R278 Other lack of coordination: Secondary | ICD-10-CM | POA: Diagnosis not present

## 2023-11-12 DIAGNOSIS — M6281 Muscle weakness (generalized): Secondary | ICD-10-CM | POA: Diagnosis not present

## 2023-11-12 DIAGNOSIS — R2681 Unsteadiness on feet: Secondary | ICD-10-CM | POA: Diagnosis not present

## 2023-11-12 DIAGNOSIS — I69320 Aphasia following cerebral infarction: Secondary | ICD-10-CM | POA: Diagnosis not present

## 2023-11-12 DIAGNOSIS — R278 Other lack of coordination: Secondary | ICD-10-CM | POA: Diagnosis not present

## 2023-11-12 DIAGNOSIS — I69391 Dysphagia following cerebral infarction: Secondary | ICD-10-CM | POA: Diagnosis not present

## 2023-11-13 DIAGNOSIS — R2681 Unsteadiness on feet: Secondary | ICD-10-CM | POA: Diagnosis not present

## 2023-11-13 DIAGNOSIS — M6281 Muscle weakness (generalized): Secondary | ICD-10-CM | POA: Diagnosis not present

## 2023-11-13 DIAGNOSIS — I69391 Dysphagia following cerebral infarction: Secondary | ICD-10-CM | POA: Diagnosis not present

## 2023-11-13 DIAGNOSIS — I69320 Aphasia following cerebral infarction: Secondary | ICD-10-CM | POA: Diagnosis not present

## 2023-11-13 DIAGNOSIS — R278 Other lack of coordination: Secondary | ICD-10-CM | POA: Diagnosis not present

## 2023-11-14 DIAGNOSIS — L578 Other skin changes due to chronic exposure to nonionizing radiation: Secondary | ICD-10-CM | POA: Diagnosis not present

## 2023-11-14 DIAGNOSIS — M6281 Muscle weakness (generalized): Secondary | ICD-10-CM | POA: Diagnosis not present

## 2023-11-14 DIAGNOSIS — I69391 Dysphagia following cerebral infarction: Secondary | ICD-10-CM | POA: Diagnosis not present

## 2023-11-14 DIAGNOSIS — R2681 Unsteadiness on feet: Secondary | ICD-10-CM | POA: Diagnosis not present

## 2023-11-14 DIAGNOSIS — L570 Actinic keratosis: Secondary | ICD-10-CM | POA: Diagnosis not present

## 2023-11-14 DIAGNOSIS — D1801 Hemangioma of skin and subcutaneous tissue: Secondary | ICD-10-CM | POA: Diagnosis not present

## 2023-11-14 DIAGNOSIS — L814 Other melanin hyperpigmentation: Secondary | ICD-10-CM | POA: Diagnosis not present

## 2023-11-14 DIAGNOSIS — D485 Neoplasm of uncertain behavior of skin: Secondary | ICD-10-CM | POA: Diagnosis not present

## 2023-11-14 DIAGNOSIS — I69320 Aphasia following cerebral infarction: Secondary | ICD-10-CM | POA: Diagnosis not present

## 2023-11-14 DIAGNOSIS — D0461 Carcinoma in situ of skin of right upper limb, including shoulder: Secondary | ICD-10-CM | POA: Diagnosis not present

## 2023-11-14 DIAGNOSIS — L821 Other seborrheic keratosis: Secondary | ICD-10-CM | POA: Diagnosis not present

## 2023-11-14 DIAGNOSIS — R278 Other lack of coordination: Secondary | ICD-10-CM | POA: Diagnosis not present

## 2023-11-15 DIAGNOSIS — I69320 Aphasia following cerebral infarction: Secondary | ICD-10-CM | POA: Diagnosis not present

## 2023-11-15 DIAGNOSIS — R278 Other lack of coordination: Secondary | ICD-10-CM | POA: Diagnosis not present

## 2023-11-15 DIAGNOSIS — R2681 Unsteadiness on feet: Secondary | ICD-10-CM | POA: Diagnosis not present

## 2023-11-15 DIAGNOSIS — M6281 Muscle weakness (generalized): Secondary | ICD-10-CM | POA: Diagnosis not present

## 2023-11-15 DIAGNOSIS — I69391 Dysphagia following cerebral infarction: Secondary | ICD-10-CM | POA: Diagnosis not present

## 2023-11-18 DIAGNOSIS — I69391 Dysphagia following cerebral infarction: Secondary | ICD-10-CM | POA: Diagnosis not present

## 2023-11-18 DIAGNOSIS — R278 Other lack of coordination: Secondary | ICD-10-CM | POA: Diagnosis not present

## 2023-11-18 DIAGNOSIS — M6281 Muscle weakness (generalized): Secondary | ICD-10-CM | POA: Diagnosis not present

## 2023-11-18 DIAGNOSIS — I69320 Aphasia following cerebral infarction: Secondary | ICD-10-CM | POA: Diagnosis not present

## 2023-11-18 DIAGNOSIS — R2681 Unsteadiness on feet: Secondary | ICD-10-CM | POA: Diagnosis not present

## 2023-11-19 DIAGNOSIS — R2681 Unsteadiness on feet: Secondary | ICD-10-CM | POA: Diagnosis not present

## 2023-11-19 DIAGNOSIS — I69391 Dysphagia following cerebral infarction: Secondary | ICD-10-CM | POA: Diagnosis not present

## 2023-11-19 DIAGNOSIS — M6281 Muscle weakness (generalized): Secondary | ICD-10-CM | POA: Diagnosis not present

## 2023-11-19 DIAGNOSIS — I69320 Aphasia following cerebral infarction: Secondary | ICD-10-CM | POA: Diagnosis not present

## 2023-11-19 DIAGNOSIS — R278 Other lack of coordination: Secondary | ICD-10-CM | POA: Diagnosis not present

## 2023-11-20 DIAGNOSIS — I69391 Dysphagia following cerebral infarction: Secondary | ICD-10-CM | POA: Diagnosis not present

## 2023-11-20 DIAGNOSIS — R278 Other lack of coordination: Secondary | ICD-10-CM | POA: Diagnosis not present

## 2023-11-20 DIAGNOSIS — I69320 Aphasia following cerebral infarction: Secondary | ICD-10-CM | POA: Diagnosis not present

## 2023-11-20 DIAGNOSIS — M6281 Muscle weakness (generalized): Secondary | ICD-10-CM | POA: Diagnosis not present

## 2023-11-20 DIAGNOSIS — R2681 Unsteadiness on feet: Secondary | ICD-10-CM | POA: Diagnosis not present

## 2023-11-21 DIAGNOSIS — R278 Other lack of coordination: Secondary | ICD-10-CM | POA: Diagnosis not present

## 2023-11-21 DIAGNOSIS — I69320 Aphasia following cerebral infarction: Secondary | ICD-10-CM | POA: Diagnosis not present

## 2023-11-21 DIAGNOSIS — R2681 Unsteadiness on feet: Secondary | ICD-10-CM | POA: Diagnosis not present

## 2023-11-21 DIAGNOSIS — I69391 Dysphagia following cerebral infarction: Secondary | ICD-10-CM | POA: Diagnosis not present

## 2023-11-21 DIAGNOSIS — M6281 Muscle weakness (generalized): Secondary | ICD-10-CM | POA: Diagnosis not present

## 2023-11-22 DIAGNOSIS — I69391 Dysphagia following cerebral infarction: Secondary | ICD-10-CM | POA: Diagnosis not present

## 2023-11-22 DIAGNOSIS — M6281 Muscle weakness (generalized): Secondary | ICD-10-CM | POA: Diagnosis not present

## 2023-11-22 DIAGNOSIS — R278 Other lack of coordination: Secondary | ICD-10-CM | POA: Diagnosis not present

## 2023-11-22 DIAGNOSIS — R2681 Unsteadiness on feet: Secondary | ICD-10-CM | POA: Diagnosis not present

## 2023-11-22 DIAGNOSIS — I69320 Aphasia following cerebral infarction: Secondary | ICD-10-CM | POA: Diagnosis not present

## 2023-12-11 ENCOUNTER — Encounter: Payer: Self-pay | Admitting: Nurse Practitioner

## 2023-12-11 ENCOUNTER — Non-Acute Institutional Stay: Payer: Self-pay | Admitting: Nurse Practitioner

## 2023-12-11 DIAGNOSIS — I4892 Unspecified atrial flutter: Secondary | ICD-10-CM | POA: Diagnosis not present

## 2023-12-11 DIAGNOSIS — L409 Psoriasis, unspecified: Secondary | ICD-10-CM

## 2023-12-11 DIAGNOSIS — R300 Dysuria: Secondary | ICD-10-CM | POA: Diagnosis not present

## 2023-12-11 DIAGNOSIS — K219 Gastro-esophageal reflux disease without esophagitis: Secondary | ICD-10-CM

## 2023-12-11 DIAGNOSIS — K5901 Slow transit constipation: Secondary | ICD-10-CM | POA: Diagnosis not present

## 2023-12-11 DIAGNOSIS — R443 Hallucinations, unspecified: Secondary | ICD-10-CM | POA: Diagnosis not present

## 2023-12-11 DIAGNOSIS — I10 Essential (primary) hypertension: Secondary | ICD-10-CM

## 2023-12-11 DIAGNOSIS — Z8673 Personal history of transient ischemic attack (TIA), and cerebral infarction without residual deficits: Secondary | ICD-10-CM

## 2023-12-11 DIAGNOSIS — E039 Hypothyroidism, unspecified: Secondary | ICD-10-CM

## 2023-12-11 DIAGNOSIS — E785 Hyperlipidemia, unspecified: Secondary | ICD-10-CM

## 2023-12-11 LAB — BASIC METABOLIC PANEL WITH GFR
BUN: 23 — AB (ref 4–21)
CO2: 27 — AB (ref 13–22)
Chloride: 103 (ref 99–108)
Creatinine: 1.1 (ref 0.6–1.3)
Glucose: 89
Potassium: 4.1 meq/L (ref 3.5–5.1)
Sodium: 136 — AB (ref 137–147)

## 2023-12-11 LAB — CBC AND DIFFERENTIAL
HCT: 38 — AB (ref 41–53)
Hemoglobin: 12.6 — AB (ref 13.5–17.5)
Neutrophils Absolute: 6279
Platelets: 208 K/uL (ref 150–400)
WBC: 9.4

## 2023-12-11 LAB — HEPATIC FUNCTION PANEL
ALT: 32 U/L (ref 10–40)
AST: 26 (ref 14–40)
Alkaline Phosphatase: 259 — AB (ref 25–125)
Bilirubin, Total: 0.5

## 2023-12-11 LAB — CBC: RBC: 4.04 (ref 3.87–5.11)

## 2023-12-11 LAB — COMPREHENSIVE METABOLIC PANEL WITH GFR
Albumin: 3.9 (ref 3.5–5.0)
Calcium: 8.9 (ref 8.7–10.7)
Globulin: 3.1
eGFR: 62

## 2023-12-11 NOTE — Progress Notes (Addendum)
 Location:  [facility] Room Number: AL907-A Place of Service:  ALF (13) Provider:  [facility] Teddy Pena, N.P.  Patient Care Team: Seabron Lenis, MD as PCP - General (Family Medicine) Hobart Powell BRAVO, MD (Inactive) as PCP - Cardiology (Cardiology)  Extended Emergency Contact Information Primary Emergency Contact: [facility] Home Phone: [phone] Relation: Daughter Secondary Emergency Contact: Willetts,Teresa  [place]: [phone] Relation: Daughter  Code Status:  Full Code  Goals of care: Advanced Directive information    10/01/2023    9:53 AM  Advanced Directives  Does Patient Have a Medical Advance Directive? No  Would patient like information on creating a medical advance directive? No - Patient declined     Chief Complaint  Patient presents with  . Hallucinations    HPI:  Pt is a 89 y.o. male seen today for an acute visit for the patient's daughter reported the patient's several vivid hallucinations over the past few days.  The patient stated that he has been seen his deceased son in his room, the furniture's in his room has been rearranged.  He stated he has a urinary frequency, up to bathroom 3 times last night, but denies burning sensation on urination, lower ABD/lower back discomfort.  Denied headaches, change of vision, chest pain, palpitations, nausea, or vomiting.  He is afebrile, no O2 desaturation.  He stated that he is in his usual state of health and baseline appetite.   Hospitalized 08/29/23-09/01/23 for strokes. Presented with visual hallucination/disturbance, mostly saw animals, other strange things like shapes around house, no auditory hallucinations, ?confusion. CTA/MRI 1. Large acute to early subacute right PCA infarct. 2. Small subacute infarct in the splenium of the corpus callosum on the left. 3. Moderate to severe chronic small vessel ischemic disease. 4. Chronic left temporal infarct. On Eliquis ,  placed on Atorvastatin . HPOA declined continuation of Zyprexa .                                  HLD LDL 111 08/30/23, on Atorvastatin              HTN, controlled, on Amlodipine . Bun/creat 18/1.2 10/17/23             PAF, on Eliquis , off rate control meds.              Psoriasis, in remission, off Enbrel, followed by Dermatology             Hypothyroidism, on Levothyroxine , TSH 2.771 08/30/23             Glaucoma, eye drops             Constipation, MiraLax , Dulcolax available to him.              GERD, taking Omeprazole, Hgb 13.3 10/17/23                  Past Medical History:  Diagnosis Date  . Glaucoma   . Hypertension   . Psoriasis    Past Surgical History:  Procedure Laterality Date  . GLAUCOMA SURGERY Left   . HERNIA REPAIR    . HYDROCELE EXCISION / REPAIR      No Known Allergies  Outpatient Encounter Medications as of 12/11/2023  Medication Sig  . amLODipine  (NORVASC ) 5 MG tablet Take 5 mg by mouth daily.  . apixaban  (ELIQUIS ) 5 MG TABS tablet Take 1 tablet (5 mg total) by mouth 2 (two)  times daily. NEEDS LABS FOR ELIQUIS  REFILLS, COME TO OFFICE.  THANK YOU  . atorvastatin  (LIPITOR) 40 MG tablet Take 1 tablet (40 mg total) by mouth at bedtime.  . bimatoprost (LUMIGAN) 0.01 % SOLN Place 1 drop into the right eye at bedtime.  . bisacodyl  (DULCOLAX) 10 MG suppository Place 1 suppository (10 mg total) rectally daily as needed for moderate constipation.  . brimonidine -timolol  (COMBIGAN ) 0.2-0.5 % ophthalmic solution Place 1 drop into both eyes in the morning and at bedtime.  . dorzolamide  (TRUSOPT ) 2 % ophthalmic solution Place 1 drop into both eyes in the morning and at bedtime.  . feeding supplement (BOOST HIGH PROTEIN) LIQD Take 1 Container by mouth 2 (two) times daily.  . levothyroxine  (SYNTHROID ) 75 MCG tablet Take 75 mcg by mouth daily. Take 5 hours after taking Omeprazole  . Multiple Vitamin (MULTIVITAMIN ADULT PO) Take 1 tablet by mouth daily with breakfast.  . omeprazole  (PRILOSEC) 40 MG capsule Take 40 mg by mouth daily before breakfast.  . polyethylene glycol (MIRALAX  / GLYCOLAX ) 17 g packet Take 17 g by mouth daily.  . Psyllium (METAMUCIL 4 IN 1 FIBER) 55.6 % POWD Take 3.4 g by mouth at bedtime.  . vitamin B-12 (CYANOCOBALAMIN ) 1000 MCG tablet Take 1,000 mcg by mouth daily.  . [DISCONTINUED] nitrofurantoin, macrocrystal-monohydrate, (MACROBID) 100 MG capsule Take 100 mg by mouth 2 (two) times daily. (Patient not taking: Reported on 12/11/2023)   No facility-administered encounter medications on file as of 12/11/2023.    Review of Systems  Constitutional:  Negative for appetite change, fatigue and fever.  HENT:  Negative for congestion and trouble swallowing.   Eyes:  Negative for visual disturbance.  Respiratory:  Negative for cough, shortness of breath and wheezing.   Cardiovascular:  Positive for leg swelling.  Gastrointestinal:  Negative for abdominal pain and constipation.  Genitourinary:  Positive for frequency. Negative for dysuria and urgency.       Increased urinary frequency last night up to 3x/night  Musculoskeletal:  Positive for arthralgias and gait problem.  Skin:  Negative for color change.  Neurological:  Negative for weakness and light-headedness.  Psychiatric/Behavioral:  Positive for hallucinations. Negative for behavioral problems, confusion and sleep disturbance.     Immunization History  Administered Date(s) Administered  . Fluad Quad(high Dose 65+) 12/16/2018  . Moderna Sars-Covid-2 Vaccination 04/14/2019, 05/12/2019, 02/17/2020, 07/19/2020, 08/19/2021  . PFIZER(Purple Top)SARS-COV-2 Vaccination 12/28/2020  . PNEUMOCOCCAL CONJUGATE-20 05/09/2022  . Pfizer(Comirnaty)Fall Seasonal Vaccine 12 years and older 01/05/2022, 12/15/2022  . Pneumococcal Conjugate-13 09/22/2013  . Pneumococcal Polysaccharide-23 07/12/2001  . Respiratory Syncytial Virus Vaccine,Recomb Aduvanted(Arexvy) 04/21/2022  . Td 11/16/2011, 05/06/2021  . Zoster  Recombinant(Shingrix) 03/09/2020, 09/09/2020   Pertinent  Health Maintenance Due  Topic Date Due  . INFLUENZA VACCINE  11/09/2023      10/09/2018   12:25 PM 10/09/2018    8:00 PM 10/10/2018    8:00 AM 10/10/2018   10:15 PM 10/11/2018    8:37 AM  Fall Risk  (RETIRED) Patient Fall Risk Level Low fall risk  Moderate fall risk  Moderate fall risk  Moderate fall risk  Moderate fall risk      Data saved with a previous flowsheet row definition   Functional Status Survey:    Vitals:   12/11/23 1029  BP: 122/72  Pulse: (!) 56  Resp: 18  Temp: 97.7 F (36.5 C)  SpO2: 97%  Weight: 158 lb (71.7 kg)  Height: 6' 1 (1.854 m)   Body mass index is  20.85 kg/m. Physical Exam Vitals and nursing note reviewed.  Constitutional:      Appearance: Normal appearance.  HENT:     Head: Normocephalic and atraumatic.     Nose: Nose normal.     Mouth/Throat:     Mouth: Mucous membranes are moist.  Eyes:     Extraocular Movements: Extraocular movements intact.     Conjunctiva/sclera: Conjunctivae normal.     Comments: Right pupil fixed, small.   Cardiovascular:     Rate and Rhythm: Normal rate and regular rhythm.     Heart sounds: No murmur heard. Pulmonary:     Effort: Pulmonary effort is normal.     Breath sounds: No rales.  Abdominal:     General: Bowel sounds are normal.     Palpations: Abdomen is soft.     Tenderness: There is no abdominal tenderness.  Musculoskeletal:        General: No tenderness. Normal range of motion.     Cervical back: Normal range of motion and neck supple.     Right lower leg: Edema present.     Left lower leg: Edema present.     Comments: Trace edema BLE  Skin:    General: Skin is warm and dry.  Neurological:     General: No focal deficit present.     Mental Status: He is alert and oriented to person, place, and time. Mental status is at baseline.     Coordination: Coordination normal.     Gait: Gait abnormal.     Comments: Mild right facial weakness.    Psychiatric:        Mood and Affect: Mood normal.        Behavior: Behavior normal.        Thought Content: Thought content normal.        Judgment: Judgment normal.     Labs reviewed: Recent Labs    08/30/23 0500 08/31/23 0633 09/01/23 0742 10/17/23 0000  NA 133* 139 139 142  K 3.1* 3.4* 4.0 4.2  CL 101 105 107 104  CO2 25 26 23  32*  GLUCOSE 95 95 95  --   BUN 16 18 18 18   CREATININE 0.85 1.04 1.12 1.2  CALCIUM  8.6* 8.9 9.2 9.1  MG  --   --  2.1  --    Recent Labs    08/29/23 1240 10/17/23 0000  AST 20  --   ALT 9  --   ALKPHOS 53  --   BILITOT 1.1  --   PROT 6.3*  --   ALBUMIN 3.3* 4.0   Recent Labs    08/29/23 1240 08/30/23 0500 09/01/23 0742 10/17/23 0000  WBC 7.3 7.4 7.4 8.5  HGB 13.5 13.4 14.5 13.3*  HCT 40.6 40.0 45.6 38*  MCV 95.5 93.7 98.7  --   PLT 180 180 175 181   Lab Results  Component Value Date   TSH 2.771 08/30/2023   Lab Results  Component Value Date   HGBA1C 5.0 08/29/2023   Lab Results  Component Value Date   CHOL 173 08/30/2023   HDL 47 08/30/2023   LDLCALC 111 (H) 08/30/2023   TRIG 74 08/30/2023   CHOLHDL 3.7 08/30/2023    Significant Diagnostic Results in last 30 days:  No results found.  Assessment/Plan Hallucination  the patient's daughter reported the patient's several vivid hallucinations over the past few days.  The patient stated that he has been seen his deceased son in his room, the furniture's in  his room has been rearranged.  He stated he has a urinary frequency, up to bathroom 3 times last night, but denies burning sensation on urination, lower ABD/lower back discomfort.  Denied headaches, change of vision, chest pain, palpitations, nausea, or vomiting.  He is afebrile, no O2 desaturation.  He stated that he is in his usual state of health and baseline appetite.  Update CBC/differential, CMP/eGFR, UA C/S, vital signs and neurocheck every shift for 48 hours.  May consider Seroquel if hallucination is not  better.  History of stroke Hospitalized 08/29/23-09/01/23 for strokes. Presented with visual hallucination/disturbance, mostly saw animals, other strange things like shapes around house, no auditory hallucinations, ?confusion. CTA/MRI 1. Large acute to early subacute right PCA infarct. 2. Small subacute infarct in the splenium of the corpus callosum on the left. 3. Moderate to severe chronic small vessel ischemic disease. 4. Chronic left temporal infarct. On Eliquis , placed on Atorvastatin . HPOA declined continuation of Zyprexa .          HTN (hypertension) Blood pressure is controlled, on Amlodipine . Bun/creat 18/1.2 10/17/23  Atrial flutter (HCC) Heart rate is controlled, on Eliquis , off rate control meds.   HLD (hyperlipidemia) LDL 111 08/30/23, on Atorvastatin   Psoriasis in remission, off Enbrel, followed by Dermatology  Hypothyroidism on Levothyroxine , TSH 2.771 08/30/23  Slow transit constipation Baseline bowel pattern is every 2 to 3 days, last BM was 2 days ago,  MiraLax , Dulcolax available to him.   GERD (gastroesophageal reflux disease) Stable, taking Omeprazole, Hgb 13.3 10/17/23     Family/ staff Communication: Plan of care reviewed with the patient, the patient's daughter-H POA, and charge nurse  Labs/tests ordered: CBC/differential, CMP/eGFR, UA C/S

## 2023-12-11 NOTE — Assessment & Plan Note (Signed)
 Blood pressure is controlled, on Amlodipine . Bun/creat 18/1.2 10/17/23

## 2023-12-11 NOTE — Assessment & Plan Note (Signed)
 LDL 111 08/30/23, on Atorvastatin

## 2023-12-11 NOTE — Assessment & Plan Note (Addendum)
 in remission, off Enbrel, followed by Dermatology

## 2023-12-11 NOTE — Assessment & Plan Note (Signed)
 Stable, taking Omeprazole, Hgb 13.3 10/17/23

## 2023-12-11 NOTE — Assessment & Plan Note (Signed)
 Hospitalized 08/29/23-09/01/23 for strokes. Presented with visual hallucination/disturbance, mostly saw animals, other strange things like shapes around house, no auditory hallucinations, ?confusion. CTA/MRI 1. Large acute to early subacute right PCA infarct. 2. Small subacute infarct in the splenium of the corpus callosum on the left. 3. Moderate to severe chronic small vessel ischemic disease. 4. Chronic left temporal infarct. On Eliquis , placed on Atorvastatin . HPOA declined continuation of Zyprexa .

## 2023-12-11 NOTE — Assessment & Plan Note (Signed)
 Baseline bowel pattern is every 2 to 3 days, last BM was 2 days ago,  MiraLax , Dulcolax available to him.

## 2023-12-11 NOTE — Assessment & Plan Note (Signed)
 Heart rate is controlled, on Eliquis , off rate control meds.

## 2023-12-11 NOTE — Progress Notes (Signed)
 This encounter was created in error - please disregard.

## 2023-12-11 NOTE — Assessment & Plan Note (Signed)
 on Levothyroxine , TSH 2.771 08/30/23

## 2023-12-11 NOTE — Assessment & Plan Note (Signed)
 the patient's daughter reported the patient's several vivid hallucinations over the past few days.  The patient stated that he has been seen his deceased son in his room, the furniture's in his room has been rearranged.  He stated he has a urinary frequency, up to bathroom 3 times last night, but denies burning sensation on urination, lower ABD/lower back discomfort.  Denied headaches, change of vision, chest pain, palpitations, nausea, or vomiting.  He is afebrile, no O2 desaturation.  He stated that he is in his usual state of health and baseline appetite.  Update CBC/differential, CMP/eGFR, UA C/S, vital signs and neurocheck every shift for 48 hours.  May consider Seroquel if hallucination is not better.

## 2023-12-13 ENCOUNTER — Encounter: Payer: Self-pay | Admitting: Internal Medicine

## 2023-12-25 ENCOUNTER — Ambulatory Visit: Attending: Physician Assistant | Admitting: Physician Assistant

## 2023-12-25 ENCOUNTER — Encounter: Payer: Self-pay | Admitting: Physician Assistant

## 2023-12-25 VITALS — BP 146/72 | HR 38 | Ht 73.0 in | Wt 158.0 lb

## 2023-12-25 DIAGNOSIS — R0609 Other forms of dyspnea: Secondary | ICD-10-CM | POA: Diagnosis not present

## 2023-12-25 DIAGNOSIS — I340 Nonrheumatic mitral (valve) insufficiency: Secondary | ICD-10-CM | POA: Diagnosis not present

## 2023-12-25 DIAGNOSIS — I77819 Aortic ectasia, unspecified site: Secondary | ICD-10-CM | POA: Insufficient documentation

## 2023-12-25 DIAGNOSIS — R600 Localized edema: Secondary | ICD-10-CM | POA: Diagnosis not present

## 2023-12-25 DIAGNOSIS — I071 Rheumatic tricuspid insufficiency: Secondary | ICD-10-CM

## 2023-12-25 DIAGNOSIS — I10 Essential (primary) hypertension: Secondary | ICD-10-CM

## 2023-12-25 DIAGNOSIS — I4892 Unspecified atrial flutter: Secondary | ICD-10-CM | POA: Insufficient documentation

## 2023-12-25 DIAGNOSIS — R42 Dizziness and giddiness: Secondary | ICD-10-CM

## 2023-12-25 DIAGNOSIS — Z7901 Long term (current) use of anticoagulants: Secondary | ICD-10-CM | POA: Diagnosis not present

## 2023-12-25 DIAGNOSIS — I483 Typical atrial flutter: Secondary | ICD-10-CM

## 2023-12-25 MED ORDER — FUROSEMIDE 20 MG PO TABS: ORAL_TABLET | ORAL | 3 refills | Status: AC

## 2023-12-25 NOTE — Patient Instructions (Addendum)
 Medication Instructions:   START TAKING: FUROSEMIDE  20 MG   FOR THREE DAYS ONLY:   TAKE 20 MG ONCE A DAY  THEN TAKE AS NEEDED   *If you need a refill on your cardiac medications before your next appointment, please call your pharmacy*   Lab Work: NONE ORDERED  TODAY    If you have labs (blood work) drawn today and your tests are completely normal, you will receive your results only by: MyChart Message (if you have MyChart) OR A paper copy in the mail If you have any lab test that is abnormal or we need to change your treatment, we will call you to review the results.   Testing/Procedures: NONE ORDERED  TODAY     Follow-Up: At [facility], you and your health needs are our priority.  As part of our continuing mission to provide you with exceptional heart care, our providers are all part of one team.  This team includes your primary Cardiologist (physician) and Advanced Practice Providers or APPs (Physician Assistants and Nurse Practitioners) who all work together to provide you with the care you need, when you need it.  Your next appointment:    6 month(s) Provider:  ORREN FABRY  PA-C    We recommend signing up for the patient portal called MyChart.  Sign up information is provided on this After Visit Summary.  MyChart is used to connect with patients for Virtual Visits (Telemedicine).  Patients are able to view lab/test results, encounter notes, upcoming appointments, etc.  Non-urgent messages can be sent to your provider as well.   To learn more about what you can do with MyChart, go to [email or web address].   Other Instructions

## 2023-12-25 NOTE — Progress Notes (Signed)
 Cardiology Office Note:  .   Date:  12/25/2023  ID:  Neil [name], DOB 01-30-1930, MRN [id number] PCP: Neil Lenis, MD  [facility] Providers Cardiologist:  Powell FORBES Sorrow, MD (Inactive) {  History of Present Illness: .   Neil [name] is a 89 y.o. male with a past medical history of ascending aortic aneurysm, hypertension, PAF on chronic anticoagulation, leg edema, and mild MR, mild to moderate TR here for follow-up appointment.  He was seen about 6 months ago and reported he was having some issues with his eyedrops.  Can exercise 4 times a week for 30 minutes at a time but at other times he feels he cannot do what he wants to do.  Feeling was worse in the morning and improves as the day goes on.  He was getting confused trying to take amlodipine  twice a day so now he only takes it in the mornings.  No chest pain or shortness of breath.  No palpitations, orthopnea, PND, edema, dizziness, syncope, or presyncope.  He continues to improve hydration.  Sleeps in his chair and gets a pressure ulcer because he is there too long.  Coughing has improved off lisinopril .  Coffee worsens it.  Overall was feeling better.  I saw the patient March 2025, he presents with a history of aortic aneurysm, hypertension, atrial fibrillation, and recent pneumonia, for a follow-up visit. The patient has been self-adjusting his amlodipine  dosage based on his blood pressure readings, taking an extra pill when his blood pressure readings are high. The patient reports a lot of variability in his blood pressure readings, with readings as high as 170/74 in the morning and as low as 140/93 in the evening. The patient also reports some leg swelling, which has been a chronic issue. The patient recently had a dermatological procedure to treat a skin condition, which has been causing some discomfort. The patient denies any recent issues with his atrial fibrillation and reports no issues with his Eliquis .  Reports no  shortness of breath nor dyspnea on exertion. Reports no chest pain, pressure, or tightness. No edema, orthopnea, PND. Reports no palpitations.   Discussed the use of AI scribe software for clinical note transcription with the patient, who gave verbal consent to proceed.  Today, he presents with hx of stroke and glaucoma  for evaluation of heart rate irregularities and swelling in the legs.  He is undergoing an EKG due to a slower heart rate observed during a check-up. He does not experience skipped heartbeats or issues with heart rhythm. He sometimes experiences tightness in his head on right left side.  He had a stroke in May, followed by an orthostatic episode in July managed at his assisted living facility. No further episodes of lightheadedness or dizziness have occurred since then. He is currently on Eliquis  and Lipitor 40 mg, which was started after his LDL was elevated at 111 during his hospitalization in May.  He has swelling in his ankles and feet, particularly on the left side, which has worsened recently. He is on amlodipine  5 mg. An echocardiogram from May showed an ejection fraction of 55-60% with no significant valvular issues. His blood pressure has stabilized in the 140s.  Reports no shortness of breath nor dyspnea on exertion. Reports no chest pain, pressure, or tightness. No edema, orthopnea, PND. Reports no palpitations.   Discussed the use of AI scribe software for clinical note transcription with the patient, who gave verbal consent to proceed.   ROS:  Pertinent ROS in HPI  Studies Reviewed: SABRA   EKG Interpretation Date/Time:  Tuesday December 25 2023 14:22:37 EDT Ventricular Rate:  62 PR Interval:  126 QRS Duration:  78 QT Interval:  414 QTC Calculation: 420 R Axis:   20  Text Interpretation: Sinus rhythm with Premature supraventricular complexes and with occasional Premature ventricular complexes Marked ST abnormality, possible inferior subendocardial injury ,  similar to previous EKG When compared with ECG of 29-Aug-2023 12:28, PREVIOUS ECG IS PRESENT Confirmed by Neil [name] [phone]) on 12/25/2023 2:49:56 PM   Echo 05/01/23  IMPRESSIONS     1. Left ventricular ejection fraction, by estimation, is 60 to 65%. The  left ventricle has normal function. The left ventricle has no regional  wall motion abnormalities. There is moderate asymmetric left ventricular  hypertrophy of the septal segment.  Left ventricular diastolic parameters are consistent with Grade I  diastolic dysfunction (impaired relaxation).   2. Right ventricular systolic function is normal. The right ventricular  size is severely enlarged. There is normal pulmonary artery systolic  pressure.   3. Left atrial size was mildly dilated.   4. Posterior leaflet prolapse. The mitral valve is abnormal. Mild mitral  valve regurgitation. No evidence of mitral stenosis.   5. Tricuspid valve regurgitation is mild to moderate.   6. The aortic valve is tricuspid. Aortic valve regurgitation is trivial.  No aortic stenosis is present.   7. Aortic dilatation noted. Aneurysm of the ascending aorta, measuring 47  mm. There is mild dilatation of the aortic arch, measuring 39 mm.   8. The inferior vena cava is normal in size with greater than 50%  respiratory variability, suggesting right atrial pressure of 3 mmHg.   Comparison(s): Prior images reviewed side by side. Notable increase in  ascending aortic dimensions from 2023.   FINDINGS   Left Ventricle: Left ventricular ejection fraction, by estimation, is 60  to 65%. The left ventricle has normal function. The left ventricle has no  regional wall motion abnormalities. 3D ejection fraction reviewed and  evaluated as part of the  interpretation. Alternate measurement of EF is felt to be most reflective  of LV function. The left ventricular internal cavity size was normal in  size. There is moderate asymmetric left ventricular hypertrophy of the   septal segment. Left ventricular  diastolic parameters are consistent with Grade I diastolic dysfunction  (impaired relaxation).   Right Ventricle: The right ventricular size is severely enlarged. No  increase in right ventricular wall thickness. Right ventricular systolic  function is normal. There is normal pulmonary artery systolic pressure.  The tricuspid regurgitant velocity is  2.75 m/s, and with an assumed right atrial pressure of 3 mmHg, the  estimated right ventricular systolic pressure is 33.2 mmHg.   Left Atrium: Left atrial size was mildly dilated.   Right Atrium: Right atrial size was normal in size.   Pericardium: There is no evidence of pericardial effusion.   Mitral Valve: Posterior leaflet prolapse. The mitral valve is abnormal.  Mild mitral valve regurgitation. No evidence of mitral valve stenosis.   Tricuspid Valve: The tricuspid valve is normal in structure. Tricuspid  valve regurgitation is mild to moderate. No evidence of tricuspid  stenosis.   Aortic Valve: The aortic valve is tricuspid. Aortic valve regurgitation is  trivial. No aortic stenosis is present.   Pulmonic Valve: The pulmonic valve was normal in structure. Pulmonic valve  regurgitation is not visualized. No evidence of pulmonic stenosis.   Aorta: Aortic dilatation noted.  There is mild dilatation of the aortic  arch, measuring 39 mm. There is an aneurysm involving the ascending aorta  measuring 47 mm.   Venous: The inferior vena cava is normal in size with greater than 50%  respiratory variability, suggesting right atrial pressure of 3 mmHg.   IAS/Shunts: The atrial septum is grossly normal.       Physical Exam:   VS:  BP (!) 146/72   Pulse (!) 38   Ht 6' 1 (1.854 m)   Wt 158 lb (71.7 kg)   SpO2 95%   BMI 20.85 kg/m    Wt Readings from Last 3 Encounters:  12/25/23 158 lb (71.7 kg)  12/25/23 158 lb (71.7 kg)  12/11/23 158 lb (71.7 kg)    GEN: Well nourished, well developed in no  acute distress NECK: No JVD; No carotid bruits CARDIAC: RRR, no murmurs, rubs, gallops RESPIRATORY:  Clear to auscultation without rales, wheezing or rhonchi  ABDOMEN: Soft, non-tender, non-distended EXTREMITIES:  + L > R edema; No deformity   ASSESSMENT AND PLAN: .    Peripheral edema, left greater than right Peripheral edema with left side more affected. Echocardiogram shows good heart function. Amlodipine  unlikely cause. - Initiate compression therapy for both legs, emphasize left leg. - Prescribe PRN Lasix  for fluid management, initially three consecutive days, then as needed. - Monitor kidney function if Lasix  used frequently.  Premature ventricular contractions (PVCs) EKG shows PVCs, heart rate stable at 62 bpm. No symptoms. - No immediate intervention required.  Ischemic stroke with residual ocular involvement Ischemic stroke in May with residual ocular issues. On Eliquis  and Lipitor. - Continue Eliquis  and Lipitor. - Schedule follow-up with eye doctor in October.  Hypertension Blood pressure stable in 140s, acceptable for age. No recent orthostatic symptoms. - Monitor blood pressure, report if rises to 170s or 180s.  Hyperlipidemia Previously elevated LDL at 111 mg/dL, target <29 mg/dL. On Lipitor 40 mg. - Order lipid panel to monitor cholesterol levels.  Aortic root dilation Aortic root dilation at 39 mm, unchanged since January. - No immediate intervention required.  Constipation Ongoing constipation with incomplete rectal emptying. Facility does not provide Metamucil without request. - Ensure regular use of Metamucil as tolerated.  Follow-up   Schedule appointment in 6 months with Dr. Loni or me.    Signed, Orren LOISE Fabry, PA-C

## 2023-12-25 NOTE — Progress Notes (Signed)
 SEE OTHER ENCOUNTER

## 2023-12-26 ENCOUNTER — Telehealth: Payer: Self-pay | Admitting: Physician Assistant

## 2023-12-26 NOTE — Telephone Encounter (Signed)
 Caller Rollo) stated she will need to get orders for patient to wear compression socks and wants to know which type knee or thigh length.

## 2023-12-27 DIAGNOSIS — E785 Hyperlipidemia, unspecified: Secondary | ICD-10-CM | POA: Diagnosis not present

## 2023-12-27 DIAGNOSIS — I10 Essential (primary) hypertension: Secondary | ICD-10-CM | POA: Diagnosis not present

## 2023-12-27 LAB — LIPID PANEL
Cholesterol: 112 (ref 0–200)
HDL: 46 (ref 35–70)
LDL Cholesterol: 51
LDl/HDL Ratio: 2.4
Triglycerides: 72 (ref 40–160)

## 2023-12-27 NOTE — Telephone Encounter (Signed)
Left message for Neil [name] to call back

## 2024-01-02 NOTE — Telephone Encounter (Signed)
 [facility] and last office note regarding compression hose faxed to them at [phone].

## 2024-01-21 ENCOUNTER — Encounter: Payer: Self-pay | Admitting: Nurse Practitioner

## 2024-01-21 ENCOUNTER — Non-Acute Institutional Stay: Payer: Self-pay | Admitting: Nurse Practitioner

## 2024-01-21 DIAGNOSIS — K219 Gastro-esophageal reflux disease without esophagitis: Secondary | ICD-10-CM | POA: Diagnosis not present

## 2024-01-21 DIAGNOSIS — I4892 Unspecified atrial flutter: Secondary | ICD-10-CM | POA: Diagnosis not present

## 2024-01-21 DIAGNOSIS — R443 Hallucinations, unspecified: Secondary | ICD-10-CM | POA: Diagnosis not present

## 2024-01-21 DIAGNOSIS — L409 Psoriasis, unspecified: Secondary | ICD-10-CM

## 2024-01-21 DIAGNOSIS — E039 Hypothyroidism, unspecified: Secondary | ICD-10-CM

## 2024-01-21 DIAGNOSIS — I10 Essential (primary) hypertension: Secondary | ICD-10-CM

## 2024-01-21 DIAGNOSIS — E785 Hyperlipidemia, unspecified: Secondary | ICD-10-CM

## 2024-01-21 NOTE — Progress Notes (Unsigned)
 Location:   [facility] Room Number: 907 Place of Service:  ALF (13) Provider: Larwance Tidus Upchurch NP  Neil Lenis, MD  Patient Care Team: Neil Lenis, MD as PCP - General (Family Medicine) Hobart Powell BRAVO, MD (Inactive) as PCP - Cardiology (Cardiology)  Extended Emergency Contact Information Primary Emergency Contact: Dinning,LISA Home Phone: [phone] Relation: Daughter Secondary Emergency Contact: Willetts,Teresa  [place]: [phone] Relation: Daughter  Code Status:  DNR Goals of care: Advanced Directive information    10/01/2023    9:53 AM  Advanced Directives  Does Patient Have a Medical Advance Directive? No  Would patient like information on creating a medical advance directive? No - Patient declined     Chief Complaint  Patient presents with  . Medical Management of Chronic Issues    HPI:  Pt is a 89 y.o. [name] seen today for medical management of chronic diseases.    Hospitalized 08/29/23-5/Neil/25 for strokes. Presented with visual hallucination/disturbance, mostly saw animals, other strange things like shapes around house, no auditory hallucinations, ?confusion. CTA/MRI 1. Large acute to early subacute right PCA infarct. 2. Small subacute infarct in the splenium of the corpus callosum on the left. 3. Moderate to severe chronic small vessel ischemic disease. 4. Chronic left temporal infarct. On Eliquis , placed on Atorvastatin . HPOA declined continuation of Zyprexa  or Seroquel for hallucinations                                HLD LDL 51 9/Neil/25, on Atorvastatin              HTN, controlled, on Amlodipine . Bun/creat 23/1.12 12/11/23             PAF, on Eliquis , off rate control meds.              Psoriasis, in remission, off Enbrel, followed by Dermatology             Hypothyroidism, on Levothyroxine , TSH 2.771 08/30/23             Glaucoma, eye drops             Constipation, MiraLax , Dulcolax available to him.               GERD, taking Omeprazole, Hgb 12.6 12/11/23                                Past Medical History:  Diagnosis Date  . Glaucoma   . Hypertension   . Psoriasis    Past Surgical History:  Procedure Laterality Date  . GLAUCOMA SURGERY Left   . HERNIA REPAIR    . HYDROCELE EXCISION / REPAIR      No Known Allergies  Allergies as of 01/21/2024   No Known Allergies      Medication List        Accurate as of January 21, 2024  2:46 PM. If you have any questions, ask your nurse or doctor.          amLODipine  5 MG tablet Commonly known as: NORVASC  Take 5 mg by mouth daily.   apixaban  5 MG Tabs tablet Commonly known as: Eliquis  Take 1 tablet (5 mg total) by mouth 2 (two) times daily. NEEDS LABS FOR ELIQUIS  REFILLS, COME TO OFFICE.  THANK YOU   atorvastatin  40 MG tablet Commonly known as: LIPITOR Take 1 tablet (40  mg total) by mouth at bedtime.   bimatoprost 0.01 % Soln Commonly known as: LUMIGAN Place 1 drop into the right eye at bedtime.   bisacodyl  10 MG suppository Commonly known as: DULCOLAX Place 1 suppository (10 mg total) rectally daily as needed for moderate constipation.   Combigan  0.2-0.5 % ophthalmic solution Generic drug: brimonidine -timolol  Place 1 drop into both eyes in the morning and at bedtime.   cyanocobalamin  1000 MCG tablet Commonly known as: VITAMIN B12 Take 1,000 mcg by mouth daily.   dorzolamide  2 % ophthalmic solution Commonly known as: TRUSOPT  Place 1 drop into both eyes in the morning and at bedtime.   feeding supplement Liqd Take 1 Container by mouth 2 (two) times daily.   fluorouracil 5 % cream Commonly known as: EFUDEX Apply 1 Application topically 2 (two) times daily.   furosemide  20 MG tablet Commonly known as: LASIX  FIRST 3 DAYS TAKE ONE TABLET DAILY   THEN  DAILY AS NEEDED   levothyroxine  75 MCG tablet Commonly known as: SYNTHROID  Take 75 mcg by mouth daily. Take 5 hours after taking Omeprazole   Metamucil 4 in 1 Fiber  55.6 % Powd Generic drug: Psyllium Take 3.4 g by mouth at bedtime.   MULTIVITAMIN ADULT PO Take 1 tablet by mouth daily with breakfast.   omeprazole 40 MG capsule Commonly known as: PRILOSEC Take 40 mg by mouth daily before breakfast.   polyethylene glycol 17 g packet Commonly known as: MIRALAX  / GLYCOLAX  Take 17 g by mouth daily.        Review of Systems  Constitutional:  Negative for appetite change, fatigue and fever.  HENT:  Negative for congestion and trouble swallowing.   Eyes:  Negative for visual disturbance.  Respiratory:  Negative for cough, shortness of breath and wheezing.   Cardiovascular:  Positive for leg swelling.  Gastrointestinal:  Negative for abdominal pain and constipation.  Genitourinary:  Positive for frequency. Negative for dysuria and urgency.       Increased urinary frequency last night up to 3x/night  Musculoskeletal:  Positive for arthralgias and gait problem.  Skin:  Negative for color change.  Neurological:  Negative for weakness and light-headedness.  Psychiatric/Behavioral:  Positive for hallucinations. Negative for behavioral problems, confusion and sleep disturbance.     Immunization History  Administered Date(s) Administered  . Fluad Quad(high Dose 65+) 12/16/2018  . Moderna Sars-Covid-2 Vaccination 04/14/2019, 05/12/2019, 02/17/2020, 07/19/2020, 08/19/2021  . PFIZER(Purple Top)SARS-COV-2 Vaccination 12/28/2020  . PNEUMOCOCCAL CONJUGATE-20 05/09/2022  . Pfizer(Comirnaty)Fall Seasonal Vaccine 12 years and older 01/05/2022, 12/15/2022  . Pneumococcal Conjugate-13 09/22/2013  . Pneumococcal Polysaccharide-23 07/12/2001  . Respiratory Syncytial Virus Vaccine,Recomb Aduvanted(Arexvy) 04/21/2022  . Td 11/16/2011, 05/06/2021  . Zoster Recombinant(Shingrix) 03/09/2020, 09/09/2020   Pertinent  Health Maintenance Due  Topic Date Due  . Influenza Vaccine  11/09/2023      10/09/2018   12:25 PM 10/09/2018    8:00 PM 10/10/2018    8:00 AM 10/10/2018    10:15 PM 10/11/2018    8:37 AM  Fall Risk  (RETIRED) Patient Fall Risk Level Low fall risk  Moderate fall risk  Moderate fall risk  Moderate fall risk  Moderate fall risk      Data saved with a previous flowsheet row definition   Functional Status Survey:    Vitals:   01/21/24 1436  BP: (!) 145/66  Pulse: 65  Resp: 18  Temp: 97.7 F (36.5 C)  SpO2: 97%  Weight: 161 lb 12.8 oz (73.4 kg)   Body  mass index is 21.35 kg/m. Physical Exam Vitals and nursing note reviewed.  Constitutional:      Appearance: Normal appearance.  HENT:     Head: Normocephalic and atraumatic.     Nose: Nose normal.     Mouth/Throat:     Mouth: Mucous membranes are moist.  Eyes:     Extraocular Movements: Extraocular movements intact.     Conjunctiva/sclera: Conjunctivae normal.     Comments: Right pupil fixed, small.   Cardiovascular:     Rate and Rhythm: Normal rate and regular rhythm.     Heart sounds: No murmur heard. Pulmonary:     Effort: Pulmonary effort is normal.     Breath sounds: No rales.  Abdominal:     General: Bowel sounds are normal.     Palpations: Abdomen is soft.     Tenderness: There is no abdominal tenderness.  Musculoskeletal:        General: No tenderness. Normal range of motion.     Cervical back: Normal range of motion and neck supple.     Right lower leg: Edema present.     Left lower leg: Edema present.     Comments: Trace edema BLE  Skin:    General: Skin is warm and dry.  Neurological:     General: No focal deficit present.     Mental Status: He is alert and oriented to person, place, and time. Mental status is at baseline.     Coordination: Coordination normal.     Gait: Gait abnormal.     Comments: Mild right facial weakness.   Psychiatric:        Mood and Affect: Mood normal.        Behavior: Behavior normal.        Thought Content: Thought content normal.        Judgment: Judgment normal.     Labs reviewed: Recent Labs    08/30/23 0500  08/31/23 0633 05/Neil/25 0742 10/17/23 0000  NA 133* 139 139 142  K 3.1* 3.4* 4.0 4.2  CL 101 105 107 104  CO2 25 26 23  32*  GLUCOSE 95 95 95  --   BUN 16 18 18 18   CREATININE 0.85 1.04 1.12 1.2  CALCIUM  8.6* 8.9 9.2 9.1  MG  --   --  2.1  --    Recent Labs    08/29/23 1240 10/17/23 0000  AST 20  --   ALT 9  --   ALKPHOS 53  --   BILITOT 1.1  --   PROT 6.3*  --   ALBUMIN 3.3* 4.0   Recent Labs    08/29/23 1240 08/30/23 0500 05/Neil/25 0742 10/17/23 0000  WBC 7.3 7.4 7.4 8.5  HGB 13.5 13.4 14.5 13.3*  HCT 40.6 40.0 45.6 38*  MCV 95.5 93.7 98.7  --   PLT 180 180 175 181   Lab Results  Component Value Date   TSH 2.771 08/30/2023   Lab Results  Component Value Date   HGBA1C 5.0 08/29/2023   Lab Results  Component Value Date   CHOL 173 08/30/2023   HDL 47 08/30/2023   LDLCALC 111 (H) 08/30/2023   TRIG 74 08/30/2023   CHOLHDL 3.7 08/30/2023    Significant Diagnostic Results in last 30 days:  No results found.  Assessment/Plan  HTN (hypertension) Permissive blood pressure control, on Amlodipine . Bun/creat 23/1.12 12/11/23  Atrial flutter (HCC) Heart rate is in control,  on Eliquis , off rate control meds.   Psoriasis  in remission, off Enbrel, followed by Dermatology  Hypothyroidism on Levothyroxine , TSH 2.771 08/30/23  GERD (gastroesophageal reflux disease) Stable,  taking Omeprazole, Hgb 12.6 12/11/23  HLD (hyperlipidemia) LDL 51 9/Neil/25, on Atorvastatin    Family/ staff Communication: plan of care reviewed with the patient and charge nurse  Labs/tests ordered:  none

## 2024-01-21 NOTE — Assessment & Plan Note (Signed)
 on Levothyroxine , TSH 2.771 08/30/23

## 2024-01-21 NOTE — Assessment & Plan Note (Signed)
 Stable,  taking Omeprazole, Hgb 12.6 12/11/23

## 2024-01-21 NOTE — Assessment & Plan Note (Signed)
 On and off

## 2024-01-21 NOTE — Assessment & Plan Note (Signed)
 Heart rate is in control, on Eliquis , off rate control meds.

## 2024-01-21 NOTE — Assessment & Plan Note (Signed)
 LDL 51 01/02/24, on Atorvastatin 

## 2024-01-21 NOTE — Assessment & Plan Note (Signed)
 in remission, off Enbrel, followed by Dermatology

## 2024-01-21 NOTE — Assessment & Plan Note (Signed)
 Permissive blood pressure control, on Amlodipine . Bun/creat 23/1.12 12/11/23

## 2024-01-29 DIAGNOSIS — H401122 Primary open-angle glaucoma, left eye, moderate stage: Secondary | ICD-10-CM | POA: Diagnosis not present

## 2024-01-29 DIAGNOSIS — H532 Diplopia: Secondary | ICD-10-CM | POA: Diagnosis not present

## 2024-01-29 DIAGNOSIS — H401113 Primary open-angle glaucoma, right eye, severe stage: Secondary | ICD-10-CM | POA: Diagnosis not present

## 2024-01-29 DIAGNOSIS — H0102A Squamous blepharitis right eye, upper and lower eyelids: Secondary | ICD-10-CM | POA: Diagnosis not present

## 2024-01-29 DIAGNOSIS — G453 Amaurosis fugax: Secondary | ICD-10-CM | POA: Diagnosis not present

## 2024-01-29 DIAGNOSIS — H0102B Squamous blepharitis left eye, upper and lower eyelids: Secondary | ICD-10-CM | POA: Diagnosis not present

## 2024-01-29 DIAGNOSIS — I639 Cerebral infarction, unspecified: Secondary | ICD-10-CM | POA: Diagnosis not present

## 2024-02-04 DIAGNOSIS — Z23 Encounter for immunization: Secondary | ICD-10-CM | POA: Diagnosis not present

## 2024-02-13 DIAGNOSIS — D485 Neoplasm of uncertain behavior of skin: Secondary | ICD-10-CM | POA: Diagnosis not present

## 2024-02-13 DIAGNOSIS — L570 Actinic keratosis: Secondary | ICD-10-CM | POA: Diagnosis not present

## 2024-02-13 DIAGNOSIS — D0471 Carcinoma in situ of skin of right lower limb, including hip: Secondary | ICD-10-CM | POA: Diagnosis not present

## 2024-02-25 ENCOUNTER — Ambulatory Visit: Admitting: Physician Assistant

## 2024-02-25 ENCOUNTER — Emergency Department (HOSPITAL_COMMUNITY)
Admission: EM | Admit: 2024-02-25 | Discharge: 2024-02-25 | Disposition: A | Discharge status: Home / Self Care | Attending: Emergency Medicine | Admitting: Emergency Medicine

## 2024-02-25 ENCOUNTER — Emergency Department (HOSPITAL_COMMUNITY)

## 2024-02-25 DIAGNOSIS — S199XXA Unspecified injury of neck, initial encounter: Secondary | ICD-10-CM | POA: Diagnosis not present

## 2024-02-25 DIAGNOSIS — S3993XA Unspecified injury of pelvis, initial encounter: Secondary | ICD-10-CM | POA: Diagnosis not present

## 2024-02-25 DIAGNOSIS — Z7901 Long term (current) use of anticoagulants: Secondary | ICD-10-CM | POA: Insufficient documentation

## 2024-02-25 DIAGNOSIS — S0101XA Laceration without foreign body of scalp, initial encounter: Secondary | ICD-10-CM | POA: Diagnosis not present

## 2024-02-25 DIAGNOSIS — I10 Essential (primary) hypertension: Secondary | ICD-10-CM | POA: Diagnosis not present

## 2024-02-25 DIAGNOSIS — Z79899 Other long term (current) drug therapy: Secondary | ICD-10-CM | POA: Diagnosis not present

## 2024-02-25 DIAGNOSIS — S0990XA Unspecified injury of head, initial encounter: Secondary | ICD-10-CM | POA: Diagnosis not present

## 2024-02-25 DIAGNOSIS — M51369 Other intervertebral disc degeneration, lumbar region without mention of lumbar back pain or lower extremity pain: Secondary | ICD-10-CM | POA: Diagnosis not present

## 2024-02-25 DIAGNOSIS — I6782 Cerebral ischemia: Secondary | ICD-10-CM | POA: Diagnosis not present

## 2024-02-25 DIAGNOSIS — I63512 Cerebral infarction due to unspecified occlusion or stenosis of left middle cerebral artery: Secondary | ICD-10-CM | POA: Diagnosis not present

## 2024-02-25 DIAGNOSIS — W19XXXA Unspecified fall, initial encounter: Secondary | ICD-10-CM | POA: Insufficient documentation

## 2024-02-25 DIAGNOSIS — S299XXA Unspecified injury of thorax, initial encounter: Secondary | ICD-10-CM | POA: Diagnosis not present

## 2024-02-25 DIAGNOSIS — M47812 Spondylosis without myelopathy or radiculopathy, cervical region: Secondary | ICD-10-CM | POA: Diagnosis not present

## 2024-02-25 DIAGNOSIS — S0091XA Abrasion of unspecified part of head, initial encounter: Secondary | ICD-10-CM | POA: Diagnosis not present

## 2024-02-25 DIAGNOSIS — M4802 Spinal stenosis, cervical region: Secondary | ICD-10-CM | POA: Diagnosis not present

## 2024-02-25 LAB — COMPREHENSIVE METABOLIC PANEL WITH GFR
ALT: 56 U/L — ABNORMAL HIGH (ref 0–44)
AST: 39 U/L (ref 15–41)
Albumin: 3.4 g/dL — ABNORMAL LOW (ref 3.5–5.0)
Alkaline Phosphatase: 132 U/L — ABNORMAL HIGH (ref 38–126)
Anion gap: 8 (ref 5–15)
BUN: 18 mg/dL (ref 8–23)
CO2: 25 mmol/L (ref 22–32)
Calcium: 8.8 mg/dL — ABNORMAL LOW (ref 8.9–10.3)
Chloride: 102 mmol/L (ref 98–111)
Creatinine, Ser: 1.08 mg/dL (ref 0.61–1.24)
GFR, Estimated: >60 mL/min (ref >60)
Glucose, Bld: 104 mg/dL — ABNORMAL HIGH (ref 70–99)
Potassium: 4.1 mmol/L (ref 3.5–5.1)
Sodium: 135 mmol/L (ref 135–145)
Total Bilirubin: 0.9 mg/dL (ref 0.0–1.2)
Total Protein: 7.2 g/dL (ref 6.5–8.1)

## 2024-02-25 LAB — PROTIME-INR
INR: 1.3 — ABNORMAL HIGH (ref 0.8–1.2)
Prothrombin Time: 16.5 s — ABNORMAL HIGH (ref 11.4–15.2)

## 2024-02-25 LAB — I-STAT CHEM 8, ED
BUN: 20 mg/dL (ref 8–23)
Calcium, Ion: 1.07 mmol/L — ABNORMAL LOW (ref 1.15–1.40)
Chloride: 105 mmol/L (ref 98–111)
Creatinine, Ser: 1.3 mg/dL — ABNORMAL HIGH (ref 0.61–1.24)
Glucose, Bld: 100 mg/dL — ABNORMAL HIGH (ref 70–99)
HCT: 38 % — ABNORMAL LOW (ref 39.0–52.0)
Hemoglobin: 12.9 g/dL — ABNORMAL LOW (ref 13.0–17.0)
Potassium: 4.1 mmol/L (ref 3.5–5.1)
Sodium: 138 mmol/L (ref 135–145)
TCO2: 23 mmol/L (ref 22–32)

## 2024-02-25 LAB — CBC
HCT: 39.6 % (ref 39.0–52.0)
Hemoglobin: 12.9 g/dL — ABNORMAL LOW (ref 13.0–17.0)
MCH: 30.3 pg (ref 26.0–34.0)
MCHC: 32.6 g/dL (ref 30.0–36.0)
MCV: 93 fL (ref 80.0–100.0)
Platelets: 201 K/uL (ref 150–400)
RBC: 4.26 MIL/uL (ref 4.22–5.81)
RDW: 13.6 % (ref 11.5–15.5)
WBC: 8.5 K/uL (ref 4.0–10.5)
nRBC: 0 % (ref 0.0–0.2)

## 2024-02-25 LAB — I-STAT CG4 LACTIC ACID, ED: Lactic Acid, Venous: 1.1 mmol/L (ref 0.5–1.9)

## 2024-02-25 LAB — ETHANOL: Alcohol, Ethyl (B): <15 mg/dL (ref <15)

## 2024-02-25 NOTE — ED Notes (Signed)
 Paper work already given to pt and family by [employer] and reviewed. [facility]. No new onest distress at this time.

## 2024-02-25 NOTE — ED Notes (Addendum)
 Trauma Response Nurse Documentation   Neil [name] is a 89 y.o. male arriving to [facility] ED via EMS  On Eliquis  (apixaban ) daily. Trauma was activated as a Level 2 by ED Charge RN based on the following trauma criteria Elderly patients > 65 with head trauma on anti-coagulation (excluding ASA).  Patient cleared for CT by Dr. Garrick. Pt transported to CT with trauma response nurse present to monitor. RN remained with the patient throughout their absence from the department for clinical observation.   GCS 15.  History   Past Medical History:  Diagnosis Date   Glaucoma    Hypertension    Psoriasis      Past Surgical History:  Procedure Laterality Date   GLAUCOMA SURGERY Left    HERNIA REPAIR     HYDROCELE EXCISION / REPAIR       Initial Focused Assessment (If applicable, or please see trauma documentation): Airway: Intact, patent Breathing: Breath sounds clear, equal bilaterally. No CP or SOB. SpO2 100% on RA.  Circulation: Approx 3cm laceration to left side of head, bleeding controlled. No other obvious signs of external hemorrhage/trauma. Pulses intact throughout. Pre-existing wound to R shin covered with gauze.  Disability: A/Ox4. MAE appropriately with equal sensation throughout. EMS c-collar in place for standard precaution.  R pupil 2 and brisk. L pupil irregularly shaped and nonreactive (baseline per pt due to eye surgery/glaucoma).   CT's Completed:   CT Head and CT C-Spine   Interventions:  Completely undressed and assessed thoroughly  20G PIV to R FA Labs drawn Head wound cleansed with NS and gauze  CXR Pelvic XR CT head and c-spine  Spoke with pt's daughter at bedside about POC.  Staples applied to head wound  Plan for disposition:  Other Discharge back to [facility].   Consults completed:  none at 0900.  Event Summary: Pt is a resident at [facility] who tripped over his own feet today and fell, striking the left side of his head and vinyl  flooring.  Pt denies LOC. Takes eliquis  daily for a-fib. Denies pain. Daughter, Neil [name] currently at bedside.   Bedside handoff with ED RN Neil [name].    Neil [name]  Trauma Response RN  Please call TRN at [phone] for further assistance.

## 2024-02-25 NOTE — Progress Notes (Signed)
 Orthopedic Tech Progress Note Patient Details:  Neil [name] Oct 03, 1929 [id number] LV2T FOT. Nothing obvious. No orders at this time. Patient ID: Neil [name], male   DOB: 24-May-1929, 89 y.o.   MRN: [id number]  [name] Lukes 02/25/2024, 8:21 AM

## 2024-02-25 NOTE — ED Provider Notes (Signed)
 [place] EMERGENCY DEPARTMENT AT [facility] Provider Note   CSN: [id number] Arrival date & time: 02/25/24  9246     Patient presents with: level 2   Neil [name] is a 89 y.o. male.   HPI Adult male arrives via EMS after fall.  Patient is on Eliquis , tripped, struck his head, sustained laceration to the left parietal region.  EMS reports patient did not report loss of consciousness, and staff at his nursing facility did not report change in interactivity.  Patient was hypertensive, not bradycardic in transport. Patient has mild memory loss, but seems to answer questions about his current status appropriately, denying pain other than in his posterior and scalp.    Prior to Admission medications   Medication Sig Start Date End Date Taking? Authorizing Provider  amLODipine  (NORVASC ) 5 MG tablet Take 5 mg by mouth daily.    [provider]  apixaban  (ELIQUIS ) 5 MG TABS tablet Take 1 tablet (5 mg total) by mouth 2 (two) times daily. NEEDS LABS FOR ELIQUIS  REFILLS, COME TO OFFICE.  THANK YOU 07/16/23   Lucien Orren SAILOR, PA-C  atorvastatin  (LIPITOR) 40 MG tablet Take 1 tablet (40 mg total) by mouth at bedtime. 09/01/23   Chatterjee, Srobona Tublu, MD  bimatoprost (LUMIGAN) 0.01 % SOLN Place 1 drop into the right eye at bedtime.    [provider]  bisacodyl  (DULCOLAX) 10 MG suppository Place 1 suppository (10 mg total) rectally daily as needed for moderate constipation. 09/01/23   Chatterjee, Srobona Tublu, MD  brimonidine -timolol  (COMBIGAN ) 0.2-0.5 % ophthalmic solution Place 1 drop into both eyes in the morning and at bedtime.    [provider]  dorzolamide  (TRUSOPT ) 2 % ophthalmic solution Place 1 drop into both eyes in the morning and at bedtime. 04/17/19   [provider]  feeding supplement (BOOST HIGH PROTEIN) LIQD Take 1 Container by mouth 2 (two) times daily.    [provider]  fluorouracil (EFUDEX) 5 % cream Apply 1 Application  topically 2 (two) times daily. 12/18/23   [provider]  furosemide  (LASIX ) 20 MG tablet FIRST 3 DAYS TAKE ONE TABLET DAILY   THEN  DAILY AS NEEDED 12/25/23   Lucien Orren SAILOR, PA-C  levothyroxine  (SYNTHROID ) 75 MCG tablet Take 75 mcg by mouth daily. Take 5 hours after taking Omeprazole    [provider]  Multiple Vitamin (MULTIVITAMIN ADULT PO) Take 1 tablet by mouth daily with breakfast.    [provider]  omeprazole (PRILOSEC) 40 MG capsule Take 40 mg by mouth daily before breakfast.    [provider]  polyethylene glycol (MIRALAX  / GLYCOLAX ) 17 g packet Take 17 g by mouth daily.    [provider]  Psyllium (METAMUCIL 4 IN 1 FIBER) 55.6 % POWD Take 3.4 g by mouth at bedtime.    [provider]  vitamin B-12 (CYANOCOBALAMIN ) 1000 MCG tablet Take 1,000 mcg by mouth daily.    [provider]    Allergies: Patient has no known allergies.    Review of Systems  Updated Vital Signs BP (!) 148/76   Pulse (!) 59   Temp 98.1 F (36.7 C) (Axillary)   Ht 1.854 m (6' 1)   Wt 73 kg   SpO2 97%   BMI 21.24 kg/m   Physical Exam Vitals and nursing note reviewed.  Constitutional:      General: He is not in acute distress.    Appearance: He is well-developed.  HENT:  Head: Normocephalic.   Eyes:     Conjunctiva/sclera: Conjunctivae normal.  Cardiovascular:     Rate and Rhythm: Normal rate and regular rhythm.  Pulmonary:     Effort: Pulmonary effort is normal. No respiratory distress.     Breath sounds: No stridor.  Abdominal:     General: There is no distension.  Skin:    General: Skin is warm and dry.  Neurological:     Mental Status: He is alert.     Cranial Nerves: No facial asymmetry.     Motor: Atrophy present.     (all labs ordered are listed, but only abnormal results are displayed) Labs Reviewed  COMPREHENSIVE METABOLIC PANEL WITH GFR - Abnormal; Notable for the following components:      Result Value    Glucose, Bld 104 (*)    Calcium  8.8 (*)    Albumin 3.4 (*)    ALT 56 (*)    Alkaline Phosphatase 132 (*)    All other components within normal limits  CBC - Abnormal; Notable for the following components:   Hemoglobin 12.9 (*)    All other components within normal limits  PROTIME-INR - Abnormal; Notable for the following components:   Prothrombin Time 16.5 (*)    INR 1.3 (*)    All other components within normal limits  I-STAT CHEM 8, ED - Abnormal; Notable for the following components:   Creatinine, Ser 1.30 (*)    Glucose, Bld 100 (*)    Calcium , Ion 1.07 (*)    Hemoglobin 12.9 (*)    HCT 38.0 (*)    All other components within normal limits  ETHANOL  I-STAT CG4 LACTIC ACID, ED    EKG: None  Radiology: CT CERVICAL SPINE WO CONTRAST Result Date: 02/25/2024 EXAM: CT CERVICAL SPINE WITHOUT CONTRAST 02/25/2024 08:35:19 AM TECHNIQUE: CT of the cervical spine was performed without the administration of intravenous contrast. Multiplanar reformatted images are provided for review. Automated exposure control, iterative reconstruction, and/or weight based adjustment of the mA/kV was utilized to reduce the radiation dose to as low as reasonably achievable. COMPARISON: None available. CLINICAL HISTORY: Polytrauma, blunt FINDINGS: CERVICAL SPINE: BONES AND ALIGNMENT: Straightening of the normal cervical lordosis with trace spondylolisthesis of C4 on C5. No evidence of traumatic malalignment. DEGENERATIVE CHANGES: Moderate disc space narrowing at multiple levels. Prominent degenerative endplate osteophytes throughout the cervical spine. There are disc osteophyte complexes at multiple levels. There is mild spinal canal stenosis at C4-C5 and C5-C6. Facet arthrosis and uncovertebral hypertrophy at multiple levels. There is significant foraminal stenosis at multiple levels throughout the cervical spine, in particular bilaterally at C4-C5 and on the right at C5-C6. SOFT TISSUES: No prevertebral soft  tissue swelling. IMPRESSION: 1. No acute abnormality of the cervical spine. 2. Degenerative changes as above. Electronically signed by: Donnice Mania MD 02/25/2024 09:52 AM EST RP Workstation: [id number]   CT HEAD WO CONTRAST Result Date: 02/25/2024 EXAM: CT HEAD WITHOUT CONTRAST 02/25/2024 08:35:19 AM TECHNIQUE: CT of the head was performed without the administration of intravenous contrast. Automated exposure control, iterative reconstruction, and/or weight based adjustment of the mA/kV was utilized to reduce the radiation dose to as low as reasonably achievable. COMPARISON: MRI head at 08/29/2023. CLINICAL HISTORY: Head trauma, moderate-severe. FINDINGS: BRAIN AND VENTRICLES: No acute hemorrhage. No evidence of acute infarct. Moderate chronic microvascular ischemic changes. Generalized parenchymal volume loss. Remote right PCA territory infarct. Additional remote cortical infarct in the posterior left temporal lobe. Small remote cortical infarct in the parasagittal right  frontal lobe. Prominent atherosclerosis of the carotid siphons. Additional atherosclerosis involving the intracranial vertebral arteries. No hydrocephalus. No extra-axial collection. No mass effect or midline shift. ORBITS: Bilateral lens replacement. SINUSES: No acute abnormality. SOFT TISSUES AND SKULL: Mild soft tissue swelling in the left frontoparietal scalp. No skull fracture. IMPRESSION: 1. No acute intracranial abnormality. 2. Mild soft tissue swelling in the left frontoparietal scalp. 3. Moderate chronic microvascular ischemic changes. 4. Generalized parenchymal volume loss. 5. Remote infarcts as above. Electronically signed by: Donnice Mania MD 02/25/2024 09:45 AM EST RP Workstation: [id number]   DG Pelvis Portable Result Date: 02/25/2024 EXAM: 1 or 2 view(s) Xray of the pelvis 02/25/2024 08:11:00 AM COMPARISON: None available. CLINICAL HISTORY: Trauma  status post fall FINDINGS: BONES AND JOINTS: No acute fracture. No focal  osseous lesion. No joint dislocation. Lumbar degenerative disc disease noted. SOFT TISSUES: The soft tissues are unremarkable. IMPRESSION: 1. No acute findings. Electronically signed by: Waddell Calk MD 02/25/2024 08:19 AM EST RP Workstation: [id number]   DG Chest Port 1 View Result Date: 02/25/2024 EXAM: 1 VIEW(S) XRAY OF THE CHEST 02/25/2024 08:11:00 AM COMPARISON: Comparison 07/03/2023 CLINICAL HISTORY: Trauma status post fall FINDINGS: LUNGS AND PLEURA: No focal pulmonary opacity. No pleural effusion. No pneumothorax. HEART AND MEDIASTINUM: Atherosclerotic calcifications noted. No acute abnormality of the cardiac and mediastinal silhouettes. BONES AND SOFT TISSUES: No acute osseous abnormality. IMPRESSION: 1. No acute cardiopulmonary process. Electronically signed by: Waddell Calk MD 02/25/2024 08:18 AM EST RP Workstation: [id number]     Procedures   Medications Ordered in the ED - No data to display  Clinical Course as of 02/25/24 1043  Mon Feb 25, 2024  0904 CT HEAD WO CONTRAST [EL]    Clinical Course User Index [EL] Charmayne Holmes, DO                                 Medical Decision Making Adult male, anticoagulated presents after trauma with obvious head injury. Patient is remaining neuroexam is generally reassuring, consistent with age, but with head injury, anticoagulation differential including intracranial hemorrhage, fracture, considered. Patient's initial vital signs reassuring, aside from hypertension. Pulse ox 99% room air  Amount and/or Complexity of Data Reviewed Independent Historian: EMS External Data Reviewed: notes. Labs: ordered. Decision-making details documented in ED Course. Radiology: ordered and independent interpretation performed. Decision-making details documented in ED Course. ECG/medicine tests: ordered and independent interpretation performed. Decision-making details documented in ED Course.  Risk Prescription drug management. Decision regarding  hospitalization. Diagnosis or treatment significantly limited by social determinants of health.  LACERATION REPAIR Performed by: Lamar Salen Authorized by: Lamar Salen Consent: Verbal consent obtained. Risks and benefits: risks, benefits and alternatives were discussed Consent given by: patient Patient identity confirmed: provided demographic data Prepped and Draped in normal sterile fashion Wound explored  Laceration Location: L parietal  Laceration Length: 6cm  No Foreign Bodies seen or palpated  Amount of cleaning: standard  Skin closure: 4 staples  Technique: close  Patient tolerance: Patient tolerated the procedure well with no immediate complications.  10:43 AM Patient in no distress, awake, alert.  Accompanied by his daughter at bedside.  We discussed all findings and I reviewed the CT imaging, no intracranial hemorrhage, fracture head, neck, labs within normal limits for this patient. Patient has had staple repair of his scalp laceration, without complication, and with reassuring labs imaging vitals, and the patient's own recollection of the fall, without any other ongoing complaints,  with stable pelvis, negative x-rays, patient discharged in stable condition.     Final diagnoses:  Fall, initial encounter  Laceration of scalp, initial encounter    ED Discharge Orders     None          Garrick Charleston, MD 02/25/24 1043

## 2024-02-25 NOTE — Discharge Instructions (Signed)
As discussed, your evaluation today has been largely reassuring.  But, it is important that you monitor your condition carefully, and do not hesitate to return to the ED if you develop new, or concerning changes in your condition. 

## 2024-02-25 NOTE — ED Triage Notes (Signed)
 PT BIB [facility] for Fall on Eliquis . From assisted living  Here for mechanical fall with la to left side of ear.  Unsure of neck pain d/t memory issues so C-collar in place. Also complains of some sacral pain that existed prior to fall. PT has hx of HTN. Takes BP meds but did not take morning meds.    172/102, 158/86 HR 70 cBG 139 98% RA

## 2024-02-25 NOTE — ED Notes (Signed)
 Family is working on transportation back to [employer].  They hope that either [facility] can pick up or that their will be someone their to help and the daughter will take him back.

## 2024-02-26 ENCOUNTER — Non-Acute Institutional Stay: Payer: Self-pay | Admitting: Nurse Practitioner

## 2024-02-26 ENCOUNTER — Encounter: Payer: Self-pay | Admitting: Nurse Practitioner

## 2024-02-26 DIAGNOSIS — R609 Edema, unspecified: Secondary | ICD-10-CM | POA: Insufficient documentation

## 2024-02-26 DIAGNOSIS — W19XXXA Unspecified fall, initial encounter: Secondary | ICD-10-CM | POA: Diagnosis not present

## 2024-02-26 DIAGNOSIS — I4892 Unspecified atrial flutter: Secondary | ICD-10-CM | POA: Diagnosis not present

## 2024-02-26 DIAGNOSIS — Z8673 Personal history of transient ischemic attack (TIA), and cerebral infarction without residual deficits: Secondary | ICD-10-CM

## 2024-02-26 DIAGNOSIS — S0101XD Laceration without foreign body of scalp, subsequent encounter: Secondary | ICD-10-CM | POA: Diagnosis not present

## 2024-02-26 DIAGNOSIS — E039 Hypothyroidism, unspecified: Secondary | ICD-10-CM | POA: Diagnosis not present

## 2024-02-26 DIAGNOSIS — S0101XS Laceration without foreign body of scalp, sequela: Secondary | ICD-10-CM | POA: Diagnosis not present

## 2024-02-26 DIAGNOSIS — I10 Essential (primary) hypertension: Secondary | ICD-10-CM | POA: Diagnosis not present

## 2024-02-26 DIAGNOSIS — E785 Hyperlipidemia, unspecified: Secondary | ICD-10-CM

## 2024-02-26 DIAGNOSIS — H409 Unspecified glaucoma: Secondary | ICD-10-CM

## 2024-02-26 DIAGNOSIS — N1831 Chronic kidney disease, stage 3a: Secondary | ICD-10-CM

## 2024-02-26 DIAGNOSIS — K219 Gastro-esophageal reflux disease without esophagitis: Secondary | ICD-10-CM | POA: Diagnosis not present

## 2024-02-26 DIAGNOSIS — N183 Chronic kidney disease, stage 3 unspecified: Secondary | ICD-10-CM | POA: Insufficient documentation

## 2024-02-26 NOTE — Assessment & Plan Note (Signed)
 on Levothyroxine , TSH 2.771 08/30/23

## 2024-02-26 NOTE — Assessment & Plan Note (Signed)
 Stable, taking Omeprazole, Hgb 12.9 02/25/24

## 2024-02-26 NOTE — Assessment & Plan Note (Signed)
 prn Furosemide  is not available to him, mild

## 2024-02-26 NOTE — Assessment & Plan Note (Signed)
 eye drops, left pupil >R pupil reaction to light.

## 2024-02-26 NOTE — Progress Notes (Signed)
 Location:  [facility] Room Number: 907 A Place of Service:  ALF [phone]) Provider:  Nilah Belcourt X, NP    Patient Care Team: Seabron Lenis, MD as PCP - General (Family Medicine) Hobart Powell BRAVO, MD (Inactive) as PCP - Cardiology (Cardiology)  Extended Emergency Contact Information Primary Emergency Contact: Catania,LISA Home Phone: [phone] Relation: Daughter Secondary Emergency Contact: Willetts,Teresa  [place]: [phone] Relation: Daughter  Code Status:  Full Code Goals of care: Advanced Directive information    10/01/2023    9:53 AM  Advanced Directives  Does Patient Have a Medical Advance Directive? No  Would patient like information on creating a medical advance directive? No - Patient declined     Chief Complaint  Patient presents with   Hospitalization Follow-up    Follow up ED for scalp laceration.    HPI:  Pt is a 89 y.o. male seen today for an acute visit for ED evaluation for scalp laceration sustained from a mechanical fall  02/25/2024 ED the left parietal scalp laceration repair, 4 staples intact, no active bleeding or signs symptoms of infection.  CBC, CMP, CT head/cervical spine, chest x-ray, x-ray pelvis are unremarkable.    Hospitalized 08/29/23-09/01/23 for strokes. Presented with visual hallucination/disturbance, mostly saw animals, other strange things like shapes around house, no auditory hallucinations, ?confusion. CTA/MRI 1. Large acute to early subacute right PCA infarct. 2. Small subacute infarct in the splenium of the corpus callosum on the left. 3. Moderate to severe chronic small vessel ischemic disease. 4. Chronic left temporal infarct. On Eliquis , placed on Atorvastatin . HPOA declined continuation of Zyprexa  or Seroquel for hallucinations                   CKD Bun/creat 20/1.3 02/25/24             HLD LDL 51 01/02/24, on Atorvastatin              HTN, controlled, on Amlodipine .               PAF, on Eliquis , off rate control meds.              Psoriasis, in remission, off Enbrel, followed by Dermatology             Hypothyroidism, on Levothyroxine , TSH 2.771 08/30/23             Glaucoma, eye drops, left pupil >R pupil(small, restriction) reaction to light.              Constipation, MiraLax , Dulcolax available to him.              GERD, taking Omeprazole, Hgb 12.9 02/25/24  Edema BLE, prn Furosemide  is not available to him                            Past Medical History:  Diagnosis Date   Glaucoma    Hypertension    Psoriasis    Past Surgical History:  Procedure Laterality Date   GLAUCOMA SURGERY Left    HERNIA REPAIR     HYDROCELE EXCISION / REPAIR      No Known Allergies  Outpatient Encounter Medications as of 02/26/2024  Medication Sig   amLODipine  (NORVASC ) 5 MG tablet Take 5 mg by mouth daily.   apixaban  (ELIQUIS ) 5 MG TABS tablet Take 1 tablet (5 mg total) by mouth 2 (two) times daily. NEEDS LABS FOR ELIQUIS  REFILLS, COME  TO OFFICE.  THANK YOU   atorvastatin  (LIPITOR) 40 MG tablet Take 1 tablet (40 mg total) by mouth at bedtime.   bimatoprost (LUMIGAN) 0.01 % SOLN Place 1 drop into the right eye at bedtime.   bisacodyl  (DULCOLAX) 10 MG suppository Place 1 suppository (10 mg total) rectally daily as needed for moderate constipation.   brimonidine -timolol  (COMBIGAN ) 0.2-0.5 % ophthalmic solution Place 1 drop into both eyes in the morning and at bedtime.   dorzolamide  (TRUSOPT ) 2 % ophthalmic solution Place 1 drop into both eyes in the morning and at bedtime.   Eyelid Cleansers (OCUSOFT LID SCRUB ORIGINAL) PADS Apply topically. Apply to eyelids topically one time a day for eyelid cleansing   feeding supplement (BOOST HIGH PROTEIN) LIQD Take 1 Container by mouth 2 (two) times daily.   furosemide  (LASIX ) 20 MG tablet FIRST 3 DAYS TAKE ONE TABLET DAILY   THEN  DAILY AS NEEDED   levothyroxine  (SYNTHROID ) 75 MCG tablet Take 75 mcg by mouth daily. Take 5 hours  after taking Omeprazole   Multiple Vitamin (MULTIVITAMIN ADULT PO) Take 1 tablet by mouth daily with breakfast.   omeprazole (PRILOSEC) 40 MG capsule Take 40 mg by mouth daily before breakfast.   polyethylene glycol (MIRALAX  / GLYCOLAX ) 17 g packet Take 17 g by mouth daily.   vitamin B-12 (CYANOCOBALAMIN ) 1000 MCG tablet Take 1,000 mcg by mouth daily.   [DISCONTINUED] fluorouracil (EFUDEX) 5 % cream Apply 1 Application topically 2 (two) times daily. (Patient not taking: Reported on 02/26/2024)   [DISCONTINUED] Psyllium (METAMUCIL 4 IN 1 FIBER) 55.6 % POWD Take 3.4 g by mouth at bedtime. (Patient not taking: Reported on 02/26/2024)   No facility-administered encounter medications on file as of 02/26/2024.    Review of Systems  Constitutional:  Negative for appetite change, fatigue and fever.  HENT:  Negative for congestion and trouble swallowing.   Eyes:  Negative for visual disturbance.  Respiratory:  Negative for cough, shortness of breath and wheezing.   Cardiovascular:  Positive for leg swelling.  Gastrointestinal:  Negative for abdominal pain and constipation.  Genitourinary:  Positive for frequency. Negative for dysuria and urgency.       Increased urinary frequency last night up to 3x/night  Musculoskeletal:  Positive for arthralgias and gait problem.  Skin:  Positive for wound.  Neurological:  Negative for weakness and light-headedness.  Psychiatric/Behavioral:  Positive for hallucinations. Negative for behavioral problems, confusion and sleep disturbance.     Immunization History  Administered Date(s) Administered   Fluad Quad(high Dose 65+) 12/16/2018   Influenza-Unspecified 01/15/2024   Moderna Sars-Covid-2 Vaccination 04/14/2019, 05/12/2019, 02/17/2020, 07/19/2020, 08/19/2021   PFIZER(Purple Top)SARS-COV-2 Vaccination 12/28/2020   PNEUMOCOCCAL CONJUGATE-20 05/09/2022   Pfizer(Comirnaty)Fall Seasonal Vaccine 12 years and older 01/05/2022, 12/15/2022   Pneumococcal  Conjugate-13 09/22/2013   Pneumococcal Polysaccharide-23 07/12/2001   Respiratory Syncytial Virus Vaccine,Recomb Aduvanted(Arexvy) 04/21/2022   Td 11/16/2011, 05/06/2021   Unspecified SARS-COV-2 Vaccination 02/04/2024   Zoster Recombinant(Shingrix) 03/09/2020, 09/09/2020   Pertinent  Health Maintenance Due  Topic Date Due   Influenza Vaccine  Completed      10/09/2018   12:25 PM 10/09/2018    8:00 PM 10/10/2018    8:00 AM 10/10/2018   10:15 PM 10/11/2018    8:37 AM  Fall Risk  (RETIRED) Patient Fall Risk Level Low fall risk  Moderate fall risk  Moderate fall risk  Moderate fall risk  Moderate fall risk      Data saved with a previous flowsheet row definition  Functional Status Survey:    Vitals:   02/26/24 1107  BP: 125/61  Pulse: 60  Resp: 19  Temp: (!) 97.4 F (36.3 C)  SpO2: 97%  Weight: 162 lb 6.4 oz (73.7 kg)  Height: 6' 1 (1.854 m)   Body mass index is 21.43 kg/m. Physical Exam Vitals and nursing note reviewed.  Constitutional:      Appearance: Normal appearance.  HENT:     Head: Normocephalic and atraumatic.     Nose: Nose normal.     Mouth/Throat:     Mouth: Mucous membranes are moist.  Eyes:     Extraocular Movements: Extraocular movements intact.     Conjunctiva/sclera: Conjunctivae normal.     Comments:  left pupil >R pupil(small, restriction) reaction to light.    Cardiovascular:     Rate and Rhythm: Normal rate and regular rhythm.     Heart sounds: No murmur heard. Pulmonary:     Effort: Pulmonary effort is normal.     Breath sounds: No rales.  Abdominal:     General: Bowel sounds are normal.     Palpations: Abdomen is soft.     Tenderness: There is no abdominal tenderness.  Musculoskeletal:        General: No tenderness. Normal range of motion.     Cervical back: Normal range of motion and neck supple.     Right lower leg: Edema present.     Left lower leg: Edema present.     Comments: Trace edema BLE  Skin:    General: Skin is warm and dry.      Comments: Left parietal scalp laceration with staples x4 closure intact, no s/s of active bleeding or infection.  RLE puncture wound by dermatology, no active bleeding or s/s of infection.   Neurological:     General: No focal deficit present.     Mental Status: He is alert and oriented to person, place, and time. Mental status is at baseline.     Coordination: Coordination normal.     Gait: Gait abnormal.     Comments: Mild right facial weakness.   Psychiatric:        Mood and Affect: Mood normal.        Behavior: Behavior normal.        Thought Content: Thought content normal.        Judgment: Judgment normal.     Labs reviewed: Recent Labs    09/01/23 0742 09/01/23 0742 10/17/23 0000 12/11/23 0000 02/25/24 0804 02/25/24 0810  NA 139   < > 142 136* 135 138  K 4.0  --  4.2 4.1 4.1 4.1  CL 107  --  104 103 102 105  CO2 23  --  32* 27* 25  --   GLUCOSE 95  --   --   --  104* 100*  BUN 18   < > 18 23* 18 20  CREATININE 1.12   < > 1.2 1.1 1.08 1.30*  CALCIUM  9.2  --  9.1 8.9 8.8*  --   MG 2.1  --   --   --   --   --    < > = values in this interval not displayed.   Recent Labs    08/29/23 1240 10/17/23 0000 12/11/23 0000 02/25/24 0804  AST 20  --  26 39  ALT 9  --  32 56*  ALKPHOS 53  --  259* 132*  BILITOT 1.1  --   --  0.9  PROT 6.3*  --   --  7.2  ALBUMIN 3.3* 4.0 3.9 3.4*   Recent Labs    08/30/23 0500 09/01/23 0742 10/17/23 0000 12/11/23 0000 02/25/24 0804 02/25/24 0810  WBC 7.4 7.4 8.5 9.4 8.5  --   NEUTROABS  --   --   --  6,279.00  --   --   HGB 13.4 14.5 13.3* 12.6* 12.9* 12.9*  HCT 40.0 45.6 38* 38* 39.6 38.0*  MCV 93.7 98.7  --   --  93.0  --   PLT 180 175 181 208 201  --    Lab Results  Component Value Date   TSH 2.771 08/30/2023   Lab Results  Component Value Date   HGBA1C 5.0 08/29/2023   Lab Results  Component Value Date   CHOL 112 12/27/2023   HDL 46 12/27/2023   LDLCALC 51 12/27/2023   TRIG 72 12/27/2023   CHOLHDL 3.7  08/30/2023    Significant Diagnostic Results in last 30 days:  CT CERVICAL SPINE WO CONTRAST Result Date: 02/25/2024 EXAM: CT CERVICAL SPINE WITHOUT CONTRAST 02/25/2024 08:35:19 AM TECHNIQUE: CT of the cervical spine was performed without the administration of intravenous contrast. Multiplanar reformatted images are provided for review. Automated exposure control, iterative reconstruction, and/or weight based adjustment of the mA/kV was utilized to reduce the radiation dose to as low as reasonably achievable. COMPARISON: None available. CLINICAL HISTORY: Polytrauma, blunt FINDINGS: CERVICAL SPINE: BONES AND ALIGNMENT: Straightening of the normal cervical lordosis with trace spondylolisthesis of C4 on C5. No evidence of traumatic malalignment. DEGENERATIVE CHANGES: Moderate disc space narrowing at multiple levels. Prominent degenerative endplate osteophytes throughout the cervical spine. There are disc osteophyte complexes at multiple levels. There is mild spinal canal stenosis at C4-C5 and C5-C6. Facet arthrosis and uncovertebral hypertrophy at multiple levels. There is significant foraminal stenosis at multiple levels throughout the cervical spine, in particular bilaterally at C4-C5 and on the right at C5-C6. SOFT TISSUES: No prevertebral soft tissue swelling. IMPRESSION: 1. No acute abnormality of the cervical spine. 2. Degenerative changes as above. Electronically signed by: Donnice Mania MD 02/25/2024 09:52 AM EST RP Workstation: [id number]   CT HEAD WO CONTRAST Result Date: 02/25/2024 EXAM: CT HEAD WITHOUT CONTRAST 02/25/2024 08:35:19 AM TECHNIQUE: CT of the head was performed without the administration of intravenous contrast. Automated exposure control, iterative reconstruction, and/or weight based adjustment of the mA/kV was utilized to reduce the radiation dose to as low as reasonably achievable. COMPARISON: MRI head at 08/29/2023. CLINICAL HISTORY: Head trauma, moderate-severe. FINDINGS: BRAIN AND  VENTRICLES: No acute hemorrhage. No evidence of acute infarct. Moderate chronic microvascular ischemic changes. Generalized parenchymal volume loss. Remote right PCA territory infarct. Additional remote cortical infarct in the posterior left temporal lobe. Small remote cortical infarct in the parasagittal right frontal lobe. Prominent atherosclerosis of the carotid siphons. Additional atherosclerosis involving the intracranial vertebral arteries. No hydrocephalus. No extra-axial collection. No mass effect or midline shift. ORBITS: Bilateral lens replacement. SINUSES: No acute abnormality. SOFT TISSUES AND SKULL: Mild soft tissue swelling in the left frontoparietal scalp. No skull fracture. IMPRESSION: 1. No acute intracranial abnormality. 2. Mild soft tissue swelling in the left frontoparietal scalp. 3. Moderate chronic microvascular ischemic changes. 4. Generalized parenchymal volume loss. 5. Remote infarcts as above. Electronically signed by: Donnice Mania MD 02/25/2024 09:45 AM EST RP Workstation: [id number]   DG Pelvis Portable Result Date: 02/25/2024 EXAM: 1 or 2 view(s) Xray of the pelvis 02/25/2024 08:11:00 AM COMPARISON: None available. CLINICAL HISTORY: Trauma  status  post fall FINDINGS: BONES AND JOINTS: No acute fracture. No focal osseous lesion. No joint dislocation. Lumbar degenerative disc disease noted. SOFT TISSUES: The soft tissues are unremarkable. IMPRESSION: 1. No acute findings. Electronically signed by: Waddell Calk MD 02/25/2024 08:19 AM EST RP Workstation: [id number]   DG Chest Port 1 View Result Date: 02/25/2024 EXAM: 1 VIEW(S) XRAY OF THE CHEST 02/25/2024 08:11:00 AM COMPARISON: Comparison 07/03/2023 CLINICAL HISTORY: Trauma status post fall FINDINGS: LUNGS AND PLEURA: No focal pulmonary opacity. No pleural effusion. No pneumothorax. HEART AND MEDIASTINUM: Atherosclerotic calcifications noted. No acute abnormality of the cardiac and mediastinal silhouettes. BONES AND SOFT TISSUES:  No acute osseous abnormality. IMPRESSION: 1. No acute cardiopulmonary process. Electronically signed by: Waddell Calk MD 02/25/2024 08:18 AM EST RP Workstation: [id number]    Assessment/Plan Scalp laceration, sequela 02/25/2024 ED the left parietal scalp laceration repair, 4 staples intact, no active bleeding or signs symptoms of infection.  CBC, CMP, CT head/cervical spine, chest x-ray, x-ray pelvis are unremarkable.  Stable issue removal in 7 to 10 days  History of stroke No focal weakness residual Occasionally visual hallucination, declined antipsychotic medications  CKD (chronic kidney disease) stage 3, GFR 30-59 ml/min (HCC) Bun/creat 20/1.3 02/25/24  HLD (hyperlipidemia) LDL 51 01/02/24, on Atorvastatin   HTN (hypertension) Blood pressure is controlled, continue amlodipine   Atrial flutter (HCC) Heart rate is in control, on Eliquis , off rate control meds.   Hypothyroidism on Levothyroxine , TSH 2.771 08/30/23  GERD (gastroesophageal reflux disease) Stable, taking Omeprazole, Hgb 12.9 02/25/24               Edema prn Furosemide  is not available to him, mild     Family/ staff Communication: Plan of care reviewed with the patient and charge nurse  Labs/tests ordered: None

## 2024-02-26 NOTE — Assessment & Plan Note (Signed)
 Heart rate is in control, on Eliquis , off rate control meds.

## 2024-02-26 NOTE — Assessment & Plan Note (Signed)
 Blood pressure is controlled, continue amlodipine .

## 2024-02-26 NOTE — Progress Notes (Deleted)
.  psc 

## 2024-02-26 NOTE — Assessment & Plan Note (Signed)
 Bun/creat 20/1.3 02/25/24

## 2024-02-26 NOTE — Assessment & Plan Note (Signed)
 No focal weakness residual Occasionally visual hallucination, declined antipsychotic medications

## 2024-02-26 NOTE — Assessment & Plan Note (Signed)
 02/25/2024 ED the left parietal scalp laceration repair, 4 staples intact, no active bleeding or signs symptoms of infection.  CBC, CMP, CT head/cervical spine, chest x-ray, x-ray pelvis are unremarkable.  Stable issue removal in 7 to 10 days

## 2024-02-26 NOTE — Assessment & Plan Note (Signed)
 LDL 51 01/02/24, on Atorvastatin 

## 2024-04-05 ENCOUNTER — Emergency Department (HOSPITAL_COMMUNITY)

## 2024-04-05 ENCOUNTER — Emergency Department (HOSPITAL_COMMUNITY)
Admission: EM | Admit: 2024-04-05 | Discharge: 2024-04-05 | Disposition: A | Discharge status: Home / Self Care | Attending: Emergency Medicine | Admitting: Emergency Medicine

## 2024-04-05 DIAGNOSIS — S0990XA Unspecified injury of head, initial encounter: Secondary | ICD-10-CM | POA: Diagnosis present

## 2024-04-05 DIAGNOSIS — W01198A Fall on same level from slipping, tripping and stumbling with subsequent striking against other object, initial encounter: Secondary | ICD-10-CM | POA: Insufficient documentation

## 2024-04-05 DIAGNOSIS — Z7901 Long term (current) use of anticoagulants: Secondary | ICD-10-CM | POA: Diagnosis not present

## 2024-04-05 DIAGNOSIS — S0101XA Laceration without foreign body of scalp, initial encounter: Secondary | ICD-10-CM | POA: Insufficient documentation

## 2024-04-05 DIAGNOSIS — Z79899 Other long term (current) drug therapy: Secondary | ICD-10-CM | POA: Diagnosis not present

## 2024-04-05 DIAGNOSIS — R001 Bradycardia, unspecified: Secondary | ICD-10-CM | POA: Diagnosis not present

## 2024-04-05 DIAGNOSIS — Y92129 Unspecified place in nursing home as the place of occurrence of the external cause: Secondary | ICD-10-CM | POA: Insufficient documentation

## 2024-04-05 LAB — COMPREHENSIVE METABOLIC PANEL WITH GFR
ALT: 48 U/L — ABNORMAL HIGH (ref 0–44)
AST: 40 U/L (ref 15–41)
Albumin: 3.7 g/dL (ref 3.5–5.0)
Alkaline Phosphatase: 128 U/L — ABNORMAL HIGH (ref 38–126)
Anion gap: 9 (ref 5–15)
BUN: 20 mg/dL (ref 8–23)
CO2: 25 mmol/L (ref 22–32)
Calcium: 8.9 mg/dL (ref 8.9–10.3)
Chloride: 103 mmol/L (ref 98–111)
Creatinine, Ser: 1.04 mg/dL (ref 0.61–1.24)
GFR, Estimated: >60 mL/min
Glucose, Bld: 102 mg/dL — ABNORMAL HIGH (ref 70–99)
Potassium: 4.1 mmol/L (ref 3.5–5.1)
Sodium: 136 mmol/L (ref 135–145)
Total Bilirubin: 0.7 mg/dL (ref 0.0–1.2)
Total Protein: 7.1 g/dL (ref 6.5–8.1)

## 2024-04-05 LAB — I-STAT CHEM 8, ED
BUN: 21 mg/dL (ref 8–23)
Calcium, Ion: 1.1 mmol/L — ABNORMAL LOW (ref 1.15–1.40)
Chloride: 102 mmol/L (ref 98–111)
Creatinine, Ser: 1.1 mg/dL (ref 0.61–1.24)
Glucose, Bld: 99 mg/dL (ref 70–99)
HCT: 36 % — ABNORMAL LOW (ref 39.0–52.0)
Hemoglobin: 12.2 g/dL — ABNORMAL LOW (ref 13.0–17.0)
Potassium: 3.9 mmol/L (ref 3.5–5.1)
Sodium: 139 mmol/L (ref 135–145)
TCO2: 23 mmol/L (ref 22–32)

## 2024-04-05 LAB — CBC
HCT: 37.6 % — ABNORMAL LOW (ref 39.0–52.0)
Hemoglobin: 12.3 g/dL — ABNORMAL LOW (ref 13.0–17.0)
MCH: 30.4 pg (ref 26.0–34.0)
MCHC: 32.7 g/dL (ref 30.0–36.0)
MCV: 93.1 fL (ref 80.0–100.0)
Platelets: 188 K/uL (ref 150–400)
RBC: 4.04 MIL/uL — ABNORMAL LOW (ref 4.22–5.81)
RDW: 13.8 % (ref 11.5–15.5)
WBC: 11.5 K/uL — ABNORMAL HIGH (ref 4.0–10.5)
nRBC: 0 % (ref 0.0–0.2)

## 2024-04-05 LAB — TROPONIN T, HIGH SENSITIVITY
Troponin T High Sensitivity: 37 ng/L — ABNORMAL HIGH (ref 0–19)
Troponin T High Sensitivity: 34 ng/L — ABNORMAL HIGH (ref 0–19)

## 2024-04-05 LAB — URINALYSIS, ROUTINE W REFLEX MICROSCOPIC
Bilirubin Urine: NEGATIVE
Glucose, UA: NEGATIVE mg/dL
Hgb urine dipstick: NEGATIVE
Ketones, ur: NEGATIVE mg/dL
Leukocytes,Ua: NEGATIVE
Nitrite: NEGATIVE
Protein, ur: NEGATIVE mg/dL
Specific Gravity, Urine: 1.01 (ref 1.005–1.030)
pH: 8 (ref 5.0–8.0)

## 2024-04-05 LAB — I-STAT CG4 LACTIC ACID, ED: Lactic Acid, Venous: 1.1 mmol/L (ref 0.5–1.9)

## 2024-04-05 MED ORDER — LIDOCAINE-EPINEPHRINE (PF) 2 %-1:200000 IJ SOLN
10.0000 mL | Freq: Once | INTRAMUSCULAR | Status: AC
  Administered 2024-04-05: 10 mL
  Filled 2024-04-05: qty 20

## 2024-04-05 MED ORDER — ACETAMINOPHEN 325 MG PO TABS
650.0000 mg | ORAL_TABLET | Freq: Once | ORAL | Status: AC
  Administered 2024-04-05: 650 mg via ORAL
  Filled 2024-04-05: qty 2

## 2024-04-05 NOTE — ED Provider Notes (Signed)
.  Laceration Repair  Date/Time: 04/05/2024 1:38 PM  Performed by: Hoy Nidia FALCON, PA-C Authorized by: Hoy Nidia FALCON, PA-C   Consent:    Consent obtained:  Verbal   Consent given by:  Patient   Risks, benefits, and alternatives were discussed: yes     Risks discussed:  Infection, need for additional repair, nerve damage, poor wound healing, poor cosmetic result, tendon damage, vascular damage, retained foreign body and pain   Alternatives discussed:  No treatment Universal protocol:    Procedure explained and questions answered to patient or proxy's satisfaction: yes     Patient identity confirmed:  Verbally with patient Laceration details:    Location:  Scalp   Scalp location:  Occipital   Length (cm):  2 Treatment:    Area cleansed with:  Povidone-iodine and saline   Amount of cleaning:  Standard   Irrigation solution:  Sterile saline   Irrigation volume:    Irrigation method:  Tap Skin repair:    Repair method:  Staples   Number of staples:  2 Approximation:    Approximation:  Close Repair type:    Repair type:  Simple Post-procedure details:    Dressing:  Open (no dressing)   Procedure completion:  Tolerated well, no immediate complications     Hoy Nidia FALCON, PA-C 04/05/24 1339    Randol Simmonds, MD 04/06/24 [phone]

## 2024-04-05 NOTE — ED Provider Notes (Signed)
 " [name] EMERGENCY DEPARTMENT AT [facility] Provider Note   CSN: [id number] Arrival date & time: 04/05/24  9092     Patient presents with: fall on blood thinners    Neil [name] is a 89 y.o. male.   HPI   Patient presents ED for evaluation after a fall.  Patient has history of memory impairment.  He also has history of previous falls.  He resides at a nursing home.  Patient recalls getting up and feeling lightheaded and dizzy.  Thought he was may be going to pass out.  Patient ended up falling striking the back of his head.  Staff at the facility found him on the ground.  They noted injury to the back of his head.  He was brought to the ED for further evaluation.  Patient denies any chest pain.  No abdominal pain.  No pain in his extremities.  He denies any fevers or chills  Prior to Admission medications  Medication Sig Start Date End Date Taking? Authorizing Provider  amLODipine  (NORVASC ) 5 MG tablet Take 5 mg by mouth daily.    [provider]  apixaban  (ELIQUIS ) 5 MG TABS tablet Take 1 tablet (5 mg total) by mouth 2 (two) times daily. NEEDS LABS FOR ELIQUIS  REFILLS, COME TO OFFICE.  THANK YOU 07/16/23   Lucien Orren SAILOR, PA-C  atorvastatin  (LIPITOR) 40 MG tablet Take 1 tablet (40 mg total) by mouth at bedtime. 09/01/23   Chatterjee, Srobona Tublu, MD  bimatoprost (LUMIGAN) 0.01 % SOLN Place 1 drop into the right eye at bedtime.    [provider]  bisacodyl  (DULCOLAX) 10 MG suppository Place 1 suppository (10 mg total) rectally daily as needed for moderate constipation. 09/01/23   Chatterjee, Srobona Tublu, MD  brimonidine -timolol  (COMBIGAN ) 0.2-0.5 % ophthalmic solution Place 1 drop into both eyes in the morning and at bedtime.    [provider]  dorzolamide  (TRUSOPT ) 2 % ophthalmic solution Place 1 drop into both eyes in the morning and at bedtime. 04/17/19   [provider]  Eyelid Cleansers (OCUSOFT LID SCRUB ORIGINAL) PADS Apply  topically. Apply to eyelids topically one time a day for eyelid cleansing    [provider]  feeding supplement (BOOST HIGH PROTEIN) LIQD Take 1 Container by mouth 2 (two) times daily.    [provider]  furosemide  (LASIX ) 20 MG tablet FIRST 3 DAYS TAKE ONE TABLET DAILY   THEN  DAILY AS NEEDED 12/25/23   Lucien Orren SAILOR, PA-C  levothyroxine  (SYNTHROID ) 75 MCG tablet Take 75 mcg by mouth daily. Take 5 hours after taking Omeprazole    [provider]  Multiple Vitamin (MULTIVITAMIN ADULT PO) Take 1 tablet by mouth daily with breakfast.    [provider]  omeprazole (PRILOSEC) 40 MG capsule Take 40 mg by mouth daily before breakfast.    [provider]  polyethylene glycol (MIRALAX  / GLYCOLAX ) 17 g packet Take 17 g by mouth daily.    [provider]  vitamin B-12 (CYANOCOBALAMIN ) 1000 MCG tablet Take 1,000 mcg by mouth daily.    [provider]    Allergies: Patient has no known allergies.    Review of Systems  Updated Vital Signs BP (!) 141/81   Pulse 68   Temp 97.6 F (36.4 C) (Oral)   Resp 16   Ht 1.854 m (6' 1)   Wt 74 kg   SpO2 100%   BMI 21.52 kg/m   Physical Exam Vitals and nursing  note reviewed.  Constitutional:      General: He is not in acute distress.    Appearance: He is well-developed.  HENT:     Head: Normocephalic.     Comments: Contusion abrasion occiput    Right Ear: External ear normal.     Left Ear: External ear normal.  Eyes:     General: No scleral icterus.       Right eye: No discharge.        Left eye: No discharge.     Conjunctiva/sclera: Conjunctivae normal.  Neck:     Trachea: No tracheal deviation.     Comments: C-collar in place Cardiovascular:     Rate and Rhythm: Normal rate and regular rhythm.  Pulmonary:     Effort: Pulmonary effort is normal. No respiratory distress.     Breath sounds: Normal breath sounds. No stridor. No wheezing or rales.  Abdominal:     General: Bowel  sounds are normal. There is no distension.     Palpations: Abdomen is soft.     Tenderness: There is no abdominal tenderness. There is no guarding or rebound.  Musculoskeletal:        General: No tenderness or deformity.     Cervical back: Neck supple.     Thoracic back: No tenderness.     Lumbar back: No tenderness.     Comments: No tenderness palpation bilateral upper extremities or lower extremities  Skin:    General: Skin is warm and dry.     Findings: No rash.  Neurological:     General: No focal deficit present.     Mental Status: He is alert.     Cranial Nerves: No cranial nerve deficit, dysarthria or facial asymmetry.     Sensory: No sensory deficit.     Motor: No abnormal muscle tone or seizure activity.     Comments: Patient moves all extremities, answers questions  Psychiatric:        Mood and Affect: Mood normal.     (all labs ordered are listed, but only abnormal results are displayed) Labs Reviewed  COMPREHENSIVE METABOLIC PANEL WITH GFR - Abnormal; Notable for the following components:      Result Value   Glucose, Bld 102 (*)    ALT 48 (*)    Alkaline Phosphatase 128 (*)    All other components within normal limits  CBC - Abnormal; Notable for the following components:   WBC 11.5 (*)    RBC 4.04 (*)    Hemoglobin 12.3 (*)    HCT 37.6 (*)    All other components within normal limits  URINALYSIS, ROUTINE W REFLEX MICROSCOPIC - Abnormal; Notable for the following components:   APPearance HAZY (*)    All other components within normal limits  I-STAT CHEM 8, ED - Abnormal; Notable for the following components:   Calcium , Ion 1.10 (*)    Hemoglobin 12.2 (*)    HCT 36.0 (*)    All other components within normal limits  TROPONIN T, HIGH SENSITIVITY - Abnormal; Notable for the following components:   Troponin T High Sensitivity 37 (*)    All other components within normal limits  TROPONIN T, HIGH SENSITIVITY - Abnormal; Notable for the following components:    Troponin T High Sensitivity 34 (*)    All other components within normal limits  I-STAT CG4 LACTIC ACID, ED    EKG: EKG Interpretation Date/Time:  Saturday April 05 2024 14:40:23 EST Ventricular Rate:  70 PR Interval:  112 QRS Duration:  84 QT Interval:  414 QTC Calculation: 447 R Axis:   18  Text Interpretation: Sinus bradycardia with Premature supraventricular complexes and with frequent Premature ventricular complexes Nonspecific ST abnormality Abnormal ECG When compared with ECG of 25-Dec-2023 14:22, No significant change since last tracing Confirmed by Randol Simmonds [phone]) on 04/05/2024 3:30:34 PM  Radiology: [facility] Pelvis Portable Result Date: 04/05/2024 CLINICAL DATA:  Trauma.  Fall. EXAM: PORTABLE PELVIS 1-2 VIEWS COMPARISON:  02/25/2024 FINDINGS: There is no evidence of pelvic fracture or diastasis. No pelvic bone lesions are seen. IMPRESSION: Negative. Electronically Signed   By: Camellia Candle M.D.   On: 04/05/2024 10:31   DG Chest Port 1 View Result Date: 04/05/2024 CLINICAL DATA:  Fall. EXAM: PORTABLE CHEST 1 VIEW COMPARISON:  02/25/2024 FINDINGS: The lungs are clear without focal pneumonia, edema, pneumothorax or pleural effusion. Cardiopericardial silhouette is at upper limits of normal for size. No acute bony abnormality. Telemetry leads overlie the chest. IMPRESSION: No active disease. Electronically Signed   By: Camellia Candle M.D.   On: 04/05/2024 10:30   CT CERVICAL SPINE WO CONTRAST Result Date: 04/05/2024 EXAM: CT CERVICAL SPINE WITHOUT CONTRAST 04/05/2024 09:41:23 AM TECHNIQUE: CT of the cervical spine was performed without the administration of intravenous contrast. Multiplanar reformatted images are provided for review. Automated exposure control, iterative reconstruction, and/or weight based adjustment of the mA/kV was utilized to reduce the radiation dose to as low as reasonably achievable. COMPARISON: 02/25/2024 CLINICAL HISTORY: Polytrauma, blunt. FINDINGS: BONES  AND ALIGNMENT: No evidence of traumatic malalignment. Similar straightening of the normal cervical lordosis. DEGENERATIVE CHANGES: Similar appearance of moderate disc space narrowing and prominent degenerative osteophytes at multiple levels in the cervical spine. Disc osteophyte complex at C4-C5 contributes to mild spinal canal stenosis. Additional disc osteophyte complex at C5-C6 contributing to mild spinal canal stenosis. There is facet arthrosis and uncovertebral hypertrophy throughout the cervical spine. Foraminal stenosis at multiple levels, most pronounced at C4-C5. SOFT TISSUES: No prevertebral soft tissue swelling. IMPRESSION: 1. No evidence of acute traumatic injury. 2. Similar degenerative changes. Electronically signed by: Donnice Mania MD 04/05/2024 10:27 AM EST RP Workstation: [id number]   CT HEAD WO CONTRAST Result Date: 04/05/2024 EXAM: CT HEAD WITHOUT CONTRAST 04/05/2024 09:41:23 AM TECHNIQUE: CT of the head was performed without the administration of intravenous contrast. Automated exposure control, iterative reconstruction, and/or weight based adjustment of the mA/kV was utilized to reduce the radiation dose to as low as reasonably achievable. COMPARISON: 02/25/2024 CLINICAL HISTORY: Head trauma, moderate-severe FINDINGS: BRAIN AND VENTRICLES: No acute hemorrhage. Redemonstrated right occipital lobe encephalomalacia. Small remote infarcts in the lateral left temporal lobe, left occipital lobe, and right cerebellum. Moderate, diffuse cerebral volume loss. Periventricular white matter changes, likely sequela of chronic small vessel ischemic disease. Atherosclerotic calcifications in intracranial carotid and vertebral arteries. No hydrocephalus. No extra-axial collection. No mass effect or midline shift. ORBITS: Bilateral lens replacements. SINUSES: No acute abnormality. SOFT TISSUES AND SKULL: Posterior scalp soft tissue injury. No skull fracture. IMPRESSION: 1. No acute intracranial abnormality  related to the head trauma. 2. Posterior scalp soft tissue injury. 3. 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 Additional small remote infarcts in the lateral left temporal lobe, left occipital lobe, and right cerebellum. Electronically signed by: Donnice Mania MD 04/05/2024 10:20 AM EST RP Workstation: [id number]     Procedures   Medications Ordered in the ED  acetaminophen  (TYLENOL ) tablet 650 mg (650 mg Oral Given 04/05/24 1248)  lidocaine -EPINEPHrine  (XYLOCAINE  W/EPI) 2 %-1:200000 (PF) injection  10 mL (10 mLs Infiltration Given 04/05/24 1359)    Clinical Course as of 04/05/24 1530  Sat Apr 05, 2024  1046 I-Stat Lactic Acid, ED Lactic acid level normal. [JK]  1046 Troponin T, High Sensitivity(!) Opponent slightly increased [JK]  1046 CBC(!) Hemoglobin similar to previous [JK]  1047 CT HEAD WO CONTRAST Head CT shows remote infarcts, no intracranial hemorrhage [JK]  1047 CT C-spine without acute injury [JK]  1047 Chest x-ray and pelvis films negative [JK]  1120 Family updated on results.  They state patient has had some of these issues with falls in the past.  Would prefer to not be admitted to the hospital for monitoring [JK]  1240 Troponin T, High Sensitivity(!) Second troponin unchanged [JK]    Clinical Course User Index [JK] Randol Simmonds, MD                                 Medical Decision Making Problems Addressed: Injury of head, initial encounter: acute illness or injury that poses a threat to life or bodily functions Scalp laceration, initial encounter: acute illness or injury that poses a threat to life or bodily functions  Amount and/or Complexity of Data Reviewed Labs: ordered. Decision-making details documented in ED Course. Radiology: ordered. Decision-making details documented in ED Course.  Risk OTC drugs. Prescription drug management.   Patient presented to the ED for evaluation after a fall.  Patient reports feeling dizzy prior to the fall.  Patient  does recall hitting his head.  Patient did sustain a laceration to the back of his head.  ED workup without signs of obvious infection.  Urinalysis is normal.  No significant anemia.  Metabolic panel does not show any significant electrolyte abnormalities.  Initial cardiac enzyme was slightly elevated but second is unchanged.  Doubt ACS.  CT scans did not show any signs of serious injury.  X-rays without signs of fracture.  Discussed findings with patient and daughter.  Cannot rule out syncopal episode causing his fall.  Patient has had similar episodes in the past.  She thinks he lost his balance.  Does not want him to be admitted to the hospital for observation.      Final diagnoses:  Scalp laceration, initial encounter  Injury of head, initial encounter    ED Discharge Orders     None          Randol Simmonds, MD 04/05/24 1531  "

## 2024-04-05 NOTE — ED Triage Notes (Signed)
 Pt BIB [facility] due to a fall. Pt stated he remembers falling. He woke up on the floor. Abrasion on the back of the head. C-collar on. A&O X4. VSS.   Bp 142/60 Hr 60-80  Cbg 126  Spo2 100%

## 2024-04-05 NOTE — Discharge Instructions (Addendum)
 Staples: Keep staples dry. Please refrain from swimming or bathing. Area can be gently cleaned with mild soap and water after 24 hours.  Staples should be removed in 7-10 days

## 2024-04-05 NOTE — ED Notes (Incomplete)
 Neil [name]

## 2024-04-05 NOTE — Progress Notes (Signed)
 Orthopedic Tech Progress Note Patient Details:  Neil [name] [birth year] [id number] Level 2 Trauma  Patient ID: [name], male   DOB: 1929/08/22, 89 y.o.   MRN: [id number]  Massie FORBES Bar 04/05/2024, 9:26 AM

## 2024-04-07 ENCOUNTER — Encounter: Payer: Self-pay | Admitting: Nurse Practitioner

## 2024-04-07 ENCOUNTER — Non-Acute Institutional Stay: Payer: Self-pay | Admitting: Nurse Practitioner

## 2024-04-07 DIAGNOSIS — I4892 Unspecified atrial flutter: Secondary | ICD-10-CM

## 2024-04-07 DIAGNOSIS — K219 Gastro-esophageal reflux disease without esophagitis: Secondary | ICD-10-CM | POA: Diagnosis not present

## 2024-04-07 DIAGNOSIS — Z8673 Personal history of transient ischemic attack (TIA), and cerebral infarction without residual deficits: Secondary | ICD-10-CM

## 2024-04-07 DIAGNOSIS — S0101XS Laceration without foreign body of scalp, sequela: Secondary | ICD-10-CM | POA: Diagnosis not present

## 2024-04-07 DIAGNOSIS — E785 Hyperlipidemia, unspecified: Secondary | ICD-10-CM | POA: Diagnosis not present

## 2024-04-07 DIAGNOSIS — I10 Essential (primary) hypertension: Secondary | ICD-10-CM | POA: Diagnosis not present

## 2024-04-07 DIAGNOSIS — N1831 Chronic kidney disease, stage 3a: Secondary | ICD-10-CM | POA: Diagnosis not present

## 2024-04-07 DIAGNOSIS — E039 Hypothyroidism, unspecified: Secondary | ICD-10-CM

## 2024-04-07 DIAGNOSIS — K5901 Slow transit constipation: Secondary | ICD-10-CM

## 2024-04-07 NOTE — Assessment & Plan Note (Signed)
 Bun/creat 21/1.1 04/05/24

## 2024-04-07 NOTE — Assessment & Plan Note (Signed)
 LDL 51 01/02/24, on Atorvastatin 

## 2024-04-07 NOTE — Assessment & Plan Note (Signed)
 Stable  MiraLax , Dulcolax available to him.

## 2024-04-07 NOTE — Assessment & Plan Note (Signed)
 ED eval 04/05/2024 for scalp laceration sustained from a mechanical fall  The patient was not found lying on the floor in the hallway 04/05/2024.  CT head/cervical spine, chest x-ray, x-ray of pelvis are unremarkable  No active bleeding/signs and symptoms of infection of the scalp laceration  No focal neurological deficits  The patient seems irritable, hostile, will UA C/S to r/o UTI, vs and neuro check qshift x72 hours.

## 2024-04-07 NOTE — Assessment & Plan Note (Signed)
Blood pressure is controlled,  on Amlodipine.

## 2024-04-07 NOTE — Assessment & Plan Note (Signed)
 On Eliquis , statin, ambulates with walker, no residuals of focal weakness

## 2024-04-07 NOTE — Assessment & Plan Note (Signed)
 on Levothyroxine , TSH 2.771 08/30/23

## 2024-04-07 NOTE — Assessment & Plan Note (Signed)
 Heart rate is in control on Eliquis , off rate control meds.

## 2024-04-07 NOTE — Progress Notes (Unsigned)
 " Location:   [facility] Room Number: 907-A Place of Service:  ALF (13) Provider: Larwance Carolyn Maniscalco NP  Seabron Lenis, MD  Patient Care Team: Seabron Lenis, MD as PCP - General (Family Medicine) Hobart Powell BRAVO, MD (Inactive) as PCP - Cardiology (Cardiology)  Extended Emergency Contact Information Primary Emergency Contact: Closs,LISA Home Phone: [phone] Relation: Daughter Secondary Emergency Contact: Willetts,Teresa  [place]: [phone] Relation: Daughter  Code Status: Full Code Goals of care: Advanced Directive information    10/01/2023    9:53 AM  Advanced Directives  Does Patient Have a Medical Advance Directive? No  Would patient like information on creating a medical advance directive? No - Patient declined     Chief Complaint  Patient presents with   Fall    fall    HPI:  Pt is a 89 y.o. male seen today for an acute visit for follow-up EE eval 04/05/2024 for scalp laceration sustained from a mechanical fall  The patient was not found lying on the floor in the hallway 04/05/2024.  CT head/cervical spine, chest x-ray, x-ray of pelvis are unremarkable  02/25/2024 ED the left parietal scalp laceration repair, 4 staples intact, no active bleeding or signs symptoms of infection.  CBC, CMP, CT head/cervical spine, chest x-ray, x-ray pelvis are unremarkable.                          Hospitalized 08/29/23-09/01/23 for strokes. Presented with visual hallucination/disturbance, mostly saw animals, other strange things like shapes around house, no auditory hallucinations, ?confusion. CTA/MRI 1. Large acute to early subacute right PCA infarct. 2. Small subacute infarct in the splenium of the corpus callosum on the left. 3. Moderate to severe chronic small vessel ischemic disease. 4. Chronic left temporal infarct. On Eliquis , placed on Atorvastatin . HPOA declined continuation of Zyprexa  or Seroquel for hallucinations                    CKD Bun/creat 21/1.1 04/05/24             HLD LDL 51 01/02/24, on Atorvastatin              HTN, controlled, on Amlodipine .              PAF, on Eliquis , off rate control meds.              Psoriasis, in remission, off Enbrel, followed by Dermatology             Hypothyroidism, on Levothyroxine , TSH 2.771 08/30/23             Glaucoma, eye drops, left pupil >R pupil(small, restriction) reaction to light.              Constipation, MiraLax , Dulcolax available to him.              GERD, taking Omeprazole, Hgb 12.2 04/05/24             Edema BLE, prn Furosemide  is not available to him                            Past Medical History:  Diagnosis Date   Glaucoma    Hypertension    Psoriasis    Past Surgical History:  Procedure Laterality Date   GLAUCOMA SURGERY Left    HERNIA REPAIR     HYDROCELE EXCISION / REPAIR  Allergies[1]  Allergies as of 04/07/2024   No Known Allergies      Medication List        Accurate as of April 07, 2024 11:59 PM. If you have any questions, ask your nurse or doctor.          amLODipine  5 MG tablet Commonly known as: NORVASC  Take 5 mg by mouth daily.   apixaban  5 MG Tabs tablet Commonly known as: Eliquis  Take 1 tablet (5 mg total) by mouth 2 (two) times daily. NEEDS LABS FOR ELIQUIS  REFILLS, COME TO OFFICE.  THANK YOU   atorvastatin  40 MG tablet Commonly known as: LIPITOR Take 1 tablet (40 mg total) by mouth at bedtime.   bimatoprost 0.01 % Soln Commonly known as: LUMIGAN Place 1 drop into the right eye at bedtime.   bisacodyl  10 MG suppository Commonly known as: DULCOLAX Place 1 suppository (10 mg total) rectally daily as needed for moderate constipation.   Combigan  0.2-0.5 % ophthalmic solution Generic drug: brimonidine -timolol  Place 1 drop into both eyes in the morning and at bedtime.   cyanocobalamin  1000 MCG tablet Commonly known as: VITAMIN B12 Take 1,000 mcg by mouth daily.   dorzolamide  2 % ophthalmic  solution Commonly known as: TRUSOPT  Place 1 drop into both eyes in the morning and at bedtime.   feeding supplement Liqd Take 1 Container by mouth 2 (two) times daily.   furosemide  20 MG tablet Commonly known as: LASIX  FIRST 3 DAYS TAKE ONE TABLET DAILY   THEN  DAILY AS NEEDED   levothyroxine  75 MCG tablet Commonly known as: SYNTHROID  Take 75 mcg by mouth daily. Take 5 hours after taking Omeprazole   MULTIVITAMIN ADULT PO Take 1 tablet by mouth daily with breakfast.   OcuSoft Lid Scrub Original Pads Apply topically. Apply to eyelids topically one time a day for eyelid cleansing   omeprazole 40 MG capsule Commonly known as: PRILOSEC Take 40 mg by mouth daily before breakfast.   polyethylene glycol 17 g packet Commonly known as: MIRALAX  / GLYCOLAX  Take 17 g by mouth daily.        Review of Systems  Constitutional:  Negative for appetite change, fatigue and fever.  HENT:  Negative for congestion and trouble swallowing.   Eyes:  Negative for visual disturbance.  Respiratory:  Negative for cough, shortness of breath and wheezing.   Cardiovascular:  Positive for leg swelling.  Gastrointestinal:  Negative for abdominal pain and constipation.  Genitourinary:  Positive for frequency. Negative for dysuria and urgency.       Increased urinary frequency last night up to 3x/night  Musculoskeletal:  Positive for arthralgias and gait problem.  Skin:  Positive for wound.  Neurological:  Negative for weakness and light-headedness.  Psychiatric/Behavioral:  Positive for hallucinations. Negative for behavioral problems, confusion and sleep disturbance.     Immunization History  Administered Date(s) Administered   Fluad Quad(high Dose 65+) 12/16/2018   Influenza-Unspecified 01/15/2024   Moderna Sars-Covid-2 Vaccination 04/14/2019, 05/12/2019, 02/17/2020, 07/19/2020, 08/19/2021   PFIZER(Purple Top)SARS-COV-2 Vaccination 12/28/2020   PNEUMOCOCCAL CONJUGATE-20 05/09/2022    Pfizer(Comirnaty)Fall Seasonal Vaccine 12 years and older 01/05/2022, 12/15/2022   Pneumococcal Conjugate-13 09/22/2013   Pneumococcal Polysaccharide-23 07/12/2001   Respiratory Syncytial Virus Vaccine,Recomb Aduvanted(Arexvy) 04/21/2022   Td 11/16/2011, 05/06/2021   Unspecified SARS-COV-2 Vaccination 02/04/2024   Zoster Recombinant(Shingrix) 03/09/2020, 09/09/2020   Pertinent  Health Maintenance Due  Topic Date Due   Influenza Vaccine  Completed      10/09/2018   12:25 PM 10/09/2018  8:00 PM 10/10/2018    8:00 AM 10/10/2018   10:15 PM 10/11/2018    8:37 AM  Fall Risk  (RETIRED) Patient Fall Risk Level Low fall risk  Moderate fall risk  Moderate fall risk  Moderate fall risk  Moderate fall risk      Data saved with a previous flowsheet row definition   Functional Status Survey:    Vitals:   04/07/24 1502  BP: 129/89  Pulse: 63  Resp: 19  Temp: 97.6 F (36.4 C)  SpO2: 96%  Weight: 161 lb 3.2 oz (73.1 kg)  Height: 6' 1 (1.854 m)   Body mass index is 21.27 kg/m. Physical Exam Vitals and nursing note reviewed.  Constitutional:      Appearance: Normal appearance.  HENT:     Head: Normocephalic and atraumatic.     Nose: Nose normal.     Mouth/Throat:     Mouth: Mucous membranes are moist.  Eyes:     Extraocular Movements: Extraocular movements intact.     Conjunctiva/sclera: Conjunctivae normal.     Comments:  left pupil >R pupil(small, restriction) reaction to light.    Cardiovascular:     Rate and Rhythm: Normal rate and regular rhythm.     Heart sounds: No murmur heard. Pulmonary:     Effort: Pulmonary effort is normal.     Breath sounds: No rales.  Abdominal:     General: Bowel sounds are normal.     Palpations: Abdomen is soft.     Tenderness: There is no abdominal tenderness.  Musculoskeletal:        General: No tenderness. Normal range of motion.     Cervical back: Normal range of motion and neck supple.     Right lower leg: Edema present.     Left lower  leg: Edema present.     Comments: Trace edema BLE  Skin:    General: Skin is warm and dry.     Findings: Bruising present.     Comments: Occipital scalp laceration, status post closure with staples, intact, no active bleeding, no signs symptoms of infection  Neurological:     General: No focal deficit present.     Mental Status: He is alert and oriented to person, place, and time. Mental status is at baseline.     Coordination: Coordination normal.     Gait: Gait abnormal.     Comments: Mild right facial weakness.   Psychiatric:     Comments: Irritable, hostile.      Labs reviewed: Recent Labs    09/01/23 0742 10/17/23 0000 12/11/23 0000 02/25/24 0804 02/25/24 0810 04/05/24 0920 04/05/24 0927  NA 139   < > 136* 135 138 136 139  K 4.0   < > 4.1 4.1 4.1 4.1 3.9  CL 107   < > 103 102 105 103 102  CO2 23   < > 27* 25  --  25  --   GLUCOSE 95  --   --  104* 100* 102* 99  BUN 18   < > 23* 18 20 20 21   CREATININE 1.12   < > 1.1 1.08 1.30* 1.04 1.10  CALCIUM  9.2   < > 8.9 8.8*  --  8.9  --   MG 2.1  --   --   --   --   --   --    < > = values in this interval not displayed.   Recent Labs    08/29/23 1240 10/17/23 0000  12/11/23 0000 02/25/24 0804 04/05/24 0920  AST 20  --  26 39 40  ALT 9  --  32 56* 48*  ALKPHOS 53  --  259* 132* 128*  BILITOT 1.1  --   --  0.9 0.7  PROT 6.3*  --   --  7.2 7.1  ALBUMIN 3.3*   < > 3.9 3.4* 3.7   < > = values in this interval not displayed.   Recent Labs    09/01/23 0742 10/17/23 0000 12/11/23 0000 02/25/24 0804 02/25/24 0810 04/05/24 0920 04/05/24 0927  WBC 7.4   < > 9.4 8.5  --  11.5*  --   NEUTROABS  --   --  6,279.00  --   --   --   --   HGB 14.5   < > 12.6* 12.9* 12.9* 12.3* 12.2*  HCT 45.6   < > 38* 39.6 38.0* 37.6* 36.0*  MCV 98.7  --   --  93.0  --  93.1  --   PLT 175   < > 208 201  --  188  --    < > = values in this interval not displayed.   Lab Results  Component Value Date   TSH 2.771 08/30/2023   Lab  Results  Component Value Date   HGBA1C 5.0 08/29/2023   Lab Results  Component Value Date   CHOL 112 12/27/2023   HDL 46 12/27/2023   LDLCALC 51 12/27/2023   TRIG 72 12/27/2023   CHOLHDL 3.7 08/30/2023    Significant Diagnostic Results in last 30 days:  DG Pelvis Portable Result Date: 04/05/2024 CLINICAL DATA:  Trauma.  Fall. EXAM: PORTABLE PELVIS 1-2 VIEWS COMPARISON:  02/25/2024 FINDINGS: There is no evidence of pelvic fracture or diastasis. No pelvic bone lesions are seen. IMPRESSION: Negative. Electronically Signed   By: Camellia Candle M.D.   On: 04/05/2024 10:31   DG Chest Port 1 View Result Date: 04/05/2024 CLINICAL DATA:  Fall. EXAM: PORTABLE CHEST 1 VIEW COMPARISON:  02/25/2024 FINDINGS: The lungs are clear without focal pneumonia, edema, pneumothorax or pleural effusion. Cardiopericardial silhouette is at upper limits of normal for size. No acute bony abnormality. Telemetry leads overlie the chest. IMPRESSION: No active disease. Electronically Signed   By: Camellia Candle M.D.   On: 04/05/2024 10:30   CT CERVICAL SPINE WO CONTRAST Result Date: 04/05/2024 EXAM: CT CERVICAL SPINE WITHOUT CONTRAST 04/05/2024 09:41:23 AM TECHNIQUE: CT of the cervical spine was performed without the administration of intravenous contrast. Multiplanar reformatted images are provided for review. Automated exposure control, iterative reconstruction, and/or weight based adjustment of the mA/kV was utilized to reduce the radiation dose to as low as reasonably achievable. COMPARISON: 02/25/2024 CLINICAL HISTORY: Polytrauma, blunt. FINDINGS: BONES AND ALIGNMENT: No evidence of traumatic malalignment. Similar straightening of the normal cervical lordosis. DEGENERATIVE CHANGES: Similar appearance of moderate disc space narrowing and prominent degenerative osteophytes at multiple levels in the cervical spine. Disc osteophyte complex at C4-C5 contributes to mild spinal canal stenosis. Additional disc osteophyte complex  at C5-C6 contributing to mild spinal canal stenosis. There is facet arthrosis and uncovertebral hypertrophy throughout the cervical spine. Foraminal stenosis at multiple levels, most pronounced at C4-C5. SOFT TISSUES: No prevertebral soft tissue swelling. IMPRESSION: 1. No evidence of acute traumatic injury. 2. Similar degenerative changes. Electronically signed by: Donnice Mania MD 04/05/2024 10:27 AM EST RP Workstation: [id number]   CT HEAD WO CONTRAST Result Date: 04/05/2024 EXAM: CT HEAD WITHOUT CONTRAST 04/05/2024 09:41:23 AM TECHNIQUE: CT of  the head was performed without the administration of intravenous contrast. Automated exposure control, iterative reconstruction, and/or weight based adjustment of the mA/kV was utilized to reduce the radiation dose to as low as reasonably achievable. COMPARISON: 02/25/2024 CLINICAL HISTORY: Head trauma, moderate-severe FINDINGS: BRAIN AND VENTRICLES: No acute hemorrhage. Redemonstrated right occipital lobe encephalomalacia. Small remote infarcts in the lateral left temporal lobe, left occipital lobe, and right cerebellum. Moderate, diffuse cerebral volume loss. Periventricular white matter changes, likely sequela of chronic small vessel ischemic disease. Atherosclerotic calcifications in intracranial carotid and vertebral arteries. No hydrocephalus. No extra-axial collection. No mass effect or midline shift. ORBITS: Bilateral lens replacements. SINUSES: No acute abnormality. SOFT TISSUES AND SKULL: Posterior scalp soft tissue injury. No skull fracture. IMPRESSION: 1. No acute intracranial abnormality related to the head trauma. 2. Posterior scalp soft tissue injury. 3. 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 Additional small remote infarcts in the lateral left temporal lobe, left occipital lobe, and right cerebellum. Electronically signed by: Donnice Mania MD 04/05/2024 10:20 AM EST RP Workstation: [id number]    Assessment/Plan: Scalp laceration, sequela ED eval  04/05/2024 for scalp laceration sustained from a mechanical fall  The patient was not found lying on the floor in the hallway 04/05/2024.  CT head/cervical spine, chest x-ray, x-ray of pelvis are unremarkable  No active bleeding/signs and symptoms of infection of the scalp laceration  No focal neurological deficits  The patient seems irritable, hostile, will UA C/S to r/o UTI, vs and neuro check qshift x72 hours.   History of stroke On Eliquis , statin, ambulates with walker, no residuals of focal weakness  CKD (chronic kidney disease) stage 3, GFR 30-59 ml/min (HCC) Bun/creat 21/1.1 04/05/24  HLD (hyperlipidemia) LDL 51 01/02/24, on Atorvastatin   HTN (hypertension) Blood pressure is controlled, on Amlodipine .   Atrial flutter (HCC) Heart rate is in control on Eliquis , off rate control meds.   Hypothyroidism on Levothyroxine , TSH 2.771 08/30/23  Slow transit constipation Stable  MiraLax , Dulcolax available to him.   GERD (gastroesophageal reflux disease) taking Omeprazole, Hgb 12.2 04/05/24    Family/ staff Communication: Plan of care reviewed with the patient and charge nurse  Labs/tests ordered:  UA C/S     [1] No Known Allergies  "

## 2024-04-07 NOTE — Assessment & Plan Note (Signed)
 taking Omeprazole, Hgb 12.2 04/05/24

## 2024-04-08 ENCOUNTER — Encounter: Payer: Self-pay | Admitting: Nurse Practitioner

## 2024-05-01 DIAGNOSIS — E785 Hyperlipidemia, unspecified: Secondary | ICD-10-CM

## 2024-05-01 MED ORDER — ATORVASTATIN CALCIUM 20 MG PO TABS: 20.0000 mg | ORAL_TABLET | Freq: Every day | ORAL | 0 refills | Status: DC

## 2024-05-02 MED ORDER — ATORVASTATIN CALCIUM 20 MG PO TABS: 20.0000 mg | ORAL_TABLET | Freq: Every day | ORAL | 0 refills | Status: AC
